# Patient Record
Sex: Female | Born: 1940
Health system: Southern US, Community
[De-identification: ages and names within clinical notes are randomized; demographics above are authoritative.]

## PROBLEM LIST (undated history)

## (undated) DIAGNOSIS — F329 Major depressive disorder, single episode, unspecified: Secondary | ICD-10-CM

## (undated) DIAGNOSIS — N39490 Overflow incontinence: Secondary | ICD-10-CM

## (undated) DIAGNOSIS — K5790 Diverticulosis of intestine, part unspecified, without perforation or abscess without bleeding: Secondary | ICD-10-CM

## (undated) DIAGNOSIS — M797 Fibromyalgia: Secondary | ICD-10-CM

## (undated) DIAGNOSIS — K449 Diaphragmatic hernia without obstruction or gangrene: Secondary | ICD-10-CM

## (undated) DIAGNOSIS — G44229 Chronic tension-type headache, not intractable: Secondary | ICD-10-CM

## (undated) DIAGNOSIS — IMO0002 Reserved for concepts with insufficient information to code with codable children: Secondary | ICD-10-CM

## (undated) DIAGNOSIS — K648 Other hemorrhoids: Secondary | ICD-10-CM

## (undated) DIAGNOSIS — K219 Gastro-esophageal reflux disease without esophagitis: Secondary | ICD-10-CM

## (undated) DIAGNOSIS — I872 Venous insufficiency (chronic) (peripheral): Secondary | ICD-10-CM

## (undated) DIAGNOSIS — E119 Type 2 diabetes mellitus without complications: Secondary | ICD-10-CM

## (undated) DIAGNOSIS — Z5189 Encounter for other specified aftercare: Secondary | ICD-10-CM

## (undated) DIAGNOSIS — K644 Residual hemorrhoidal skin tags: Secondary | ICD-10-CM

## (undated) DIAGNOSIS — I10 Essential (primary) hypertension: Secondary | ICD-10-CM

## (undated) DIAGNOSIS — Z87442 Personal history of urinary calculi: Secondary | ICD-10-CM

## (undated) DIAGNOSIS — D509 Iron deficiency anemia, unspecified: Secondary | ICD-10-CM

## (undated) DIAGNOSIS — I700 Atherosclerosis of aorta: Secondary | ICD-10-CM

## (undated) DIAGNOSIS — J45909 Unspecified asthma, uncomplicated: Secondary | ICD-10-CM

## (undated) DIAGNOSIS — K5909 Other constipation: Secondary | ICD-10-CM

## (undated) DIAGNOSIS — N393 Stress incontinence (female) (male): Secondary | ICD-10-CM

## (undated) DIAGNOSIS — E663 Overweight: Secondary | ICD-10-CM

## (undated) DIAGNOSIS — M1990 Unspecified osteoarthritis, unspecified site: Secondary | ICD-10-CM

## (undated) DIAGNOSIS — M8580 Other specified disorders of bone density and structure, unspecified site: Secondary | ICD-10-CM

## (undated) DIAGNOSIS — J302 Other seasonal allergic rhinitis: Secondary | ICD-10-CM

## (undated) DIAGNOSIS — D1330 Benign neoplasm of unspecified part of small intestine: Secondary | ICD-10-CM

## (undated) DIAGNOSIS — R7611 Nonspecific reaction to tuberculin skin test without active tuberculosis: Secondary | ICD-10-CM

## (undated) DIAGNOSIS — E785 Hyperlipidemia, unspecified: Secondary | ICD-10-CM

## (undated) DIAGNOSIS — E559 Vitamin D deficiency, unspecified: Secondary | ICD-10-CM

## (undated) HISTORY — DX: Gastro-esophageal reflux disease without esophagitis: K21.9

## (undated) HISTORY — DX: Diaphragmatic hernia without obstruction or gangrene: K44.9

## (undated) HISTORY — DX: Residual hemorrhoidal skin tags: K64.4

## (undated) HISTORY — DX: Benign neoplasm of unspecified part of small intestine: D13.30

## (undated) HISTORY — PX: CHOLECYSTECTOMY: SHX55

## (undated) HISTORY — DX: Hyperlipidemia, unspecified: E78.5

## (undated) HISTORY — DX: Reserved for concepts with insufficient information to code with codable children: IMO0002

## (undated) HISTORY — DX: Essential (primary) hypertension: I10

## (undated) HISTORY — DX: Other seasonal allergic rhinitis: J30.2

## (undated) HISTORY — DX: Diverticulosis of intestine, part unspecified, without perforation or abscess without bleeding: K57.90

## (undated) HISTORY — DX: Unspecified asthma, uncomplicated: J45.909

## (undated) HISTORY — DX: Iron deficiency anemia, unspecified: D50.9

## (undated) HISTORY — DX: Stress incontinence (female) (male): N39.3

## (undated) HISTORY — DX: Other specified disorders of bone density and structure, unspecified site: M85.80

## (undated) HISTORY — DX: Overflow incontinence: N39.490

## (undated) HISTORY — DX: Overweight: E66.3

## (undated) HISTORY — DX: Unspecified osteoarthritis, unspecified site: M19.90

## (undated) HISTORY — DX: Other constipation: K59.09

## (undated) HISTORY — DX: Atherosclerosis of aorta: I70.0

## (undated) HISTORY — DX: Encounter for other specified aftercare: Z51.89

## (undated) HISTORY — DX: Chronic tension-type headache, not intractable: G44.229

## (undated) HISTORY — DX: Type 2 diabetes mellitus without complications: E11.9

## (undated) HISTORY — DX: Vitamin D deficiency, unspecified: E55.9

## (undated) HISTORY — DX: Nonspecific reaction to tuberculin skin test without active tuberculosis: R76.11

## (undated) HISTORY — DX: Other hemorrhoids: K64.8

## (undated) HISTORY — DX: Major depressive disorder, single episode, unspecified: F32.9

## (undated) HISTORY — DX: Fibromyalgia: M79.7

## (undated) HISTORY — PX: TONSILLECTOMY: SUR1361

## (undated) HISTORY — PX: OTHER SURGICAL HISTORY: SHX169

## (undated) HISTORY — DX: Venous insufficiency (chronic) (peripheral): I87.2

---

## 1962-12-19 HISTORY — PX: TONSILLECTOMY: SHX5217

## 1978-12-19 HISTORY — PX: TUBAL LIGATION: SHX77

## 1983-12-20 HISTORY — PX: TOTAL ABDOMINAL HYSTERECTOMY: SHX209

## 1993-12-19 HISTORY — PX: KNEE ARTHROSCOPY: SUR90

## 1998-08-23 ENCOUNTER — Inpatient Hospital Stay (HOSPITAL_COMMUNITY): Admission: EM | Admit: 1998-08-23 | Discharge: 1998-08-25 | Payer: Self-pay | Admitting: Emergency Medicine

## 1998-10-07 ENCOUNTER — Emergency Department (HOSPITAL_COMMUNITY): Admission: EM | Admit: 1998-10-07 | Discharge: 1998-10-07 | Payer: Self-pay | Admitting: Emergency Medicine

## 1999-05-19 ENCOUNTER — Encounter: Payer: Self-pay | Admitting: *Deleted

## 1999-05-19 ENCOUNTER — Ambulatory Visit (HOSPITAL_COMMUNITY): Admission: RE | Admit: 1999-05-19 | Discharge: 1999-05-19 | Payer: Self-pay | Admitting: *Deleted

## 1999-07-17 ENCOUNTER — Encounter: Payer: Self-pay | Admitting: *Deleted

## 1999-07-17 ENCOUNTER — Ambulatory Visit: Admission: RE | Admit: 1999-07-17 | Discharge: 1999-07-17 | Payer: Self-pay | Admitting: *Deleted

## 1999-08-20 ENCOUNTER — Encounter: Payer: Self-pay | Admitting: *Deleted

## 1999-08-20 ENCOUNTER — Ambulatory Visit (HOSPITAL_COMMUNITY): Admission: RE | Admit: 1999-08-20 | Discharge: 1999-08-20 | Payer: Self-pay | Admitting: *Deleted

## 2000-01-07 ENCOUNTER — Encounter: Payer: Self-pay | Admitting: Emergency Medicine

## 2000-01-07 ENCOUNTER — Emergency Department (HOSPITAL_COMMUNITY): Admission: EM | Admit: 2000-01-07 | Discharge: 2000-01-07 | Payer: Self-pay | Admitting: Emergency Medicine

## 2001-05-22 ENCOUNTER — Ambulatory Visit (HOSPITAL_BASED_OUTPATIENT_CLINIC_OR_DEPARTMENT_OTHER): Admission: RE | Admit: 2001-05-22 | Discharge: 2001-05-22 | Payer: Self-pay | Admitting: Orthopedic Surgery

## 2003-10-29 ENCOUNTER — Ambulatory Visit (HOSPITAL_COMMUNITY): Admission: RE | Admit: 2003-10-29 | Discharge: 2003-10-29 | Payer: Self-pay | Admitting: *Deleted

## 2003-10-29 ENCOUNTER — Encounter (INDEPENDENT_AMBULATORY_CARE_PROVIDER_SITE_OTHER): Payer: Self-pay | Admitting: Specialist

## 2003-10-29 ENCOUNTER — Encounter (INDEPENDENT_AMBULATORY_CARE_PROVIDER_SITE_OTHER): Payer: Self-pay | Admitting: *Deleted

## 2003-11-11 ENCOUNTER — Other Ambulatory Visit: Admission: RE | Admit: 2003-11-11 | Discharge: 2003-11-11 | Payer: Self-pay | Admitting: Endocrinology

## 2004-10-23 ENCOUNTER — Ambulatory Visit (HOSPITAL_COMMUNITY): Admission: RE | Admit: 2004-10-23 | Discharge: 2004-10-23 | Payer: Self-pay | Admitting: Sports Medicine

## 2006-07-03 ENCOUNTER — Ambulatory Visit: Payer: Self-pay | Admitting: Gastroenterology

## 2006-07-07 ENCOUNTER — Ambulatory Visit: Payer: Self-pay | Admitting: Gastroenterology

## 2006-07-10 ENCOUNTER — Ambulatory Visit (HOSPITAL_COMMUNITY): Admission: RE | Admit: 2006-07-10 | Discharge: 2006-07-10 | Payer: Self-pay | Admitting: Gastroenterology

## 2006-07-10 ENCOUNTER — Encounter (INDEPENDENT_AMBULATORY_CARE_PROVIDER_SITE_OTHER): Payer: Self-pay | Admitting: *Deleted

## 2006-08-02 ENCOUNTER — Ambulatory Visit: Payer: Self-pay | Admitting: Gastroenterology

## 2008-04-02 ENCOUNTER — Ambulatory Visit (HOSPITAL_COMMUNITY): Admission: RE | Admit: 2008-04-02 | Discharge: 2008-04-02 | Payer: Self-pay | Admitting: Infectious Diseases

## 2008-04-02 ENCOUNTER — Ambulatory Visit: Payer: Self-pay | Admitting: Infectious Diseases

## 2008-04-02 LAB — CONVERTED CEMR LAB
Albumin: 3.6 g/dL (ref 3.5–5.2)
BUN: 12 mg/dL (ref 6–23)
CO2: 29 meq/L (ref 19–32)
Calcium: 9.1 mg/dL (ref 8.4–10.5)
Chloride: 102 meq/L (ref 96–112)
Glucose, Bld: 103 mg/dL — ABNORMAL HIGH (ref 70–99)
HCT: 33.5 % — ABNORMAL LOW (ref 36.0–46.0)
Lymphocytes Relative: 35 % (ref 12–46)
Lymphs Abs: 1.6 10*3/uL (ref 0.7–4.0)
MCV: 77.1 fL — ABNORMAL LOW (ref 78.0–100.0)
Monocytes Absolute: 0.5 10*3/uL (ref 0.1–1.0)
Monocytes Relative: 12 % (ref 3–12)
Neutro Abs: 2.2 10*3/uL (ref 1.7–7.7)
Potassium: 3.8 meq/L (ref 3.5–5.3)
RDW: 14.3 % (ref 11.5–15.5)
WBC: 4.5 10*3/uL (ref 4.0–10.5)

## 2009-03-18 ENCOUNTER — Encounter: Payer: Self-pay | Admitting: Physician Assistant

## 2009-04-09 DIAGNOSIS — E119 Type 2 diabetes mellitus without complications: Secondary | ICD-10-CM

## 2009-04-09 DIAGNOSIS — E785 Hyperlipidemia, unspecified: Secondary | ICD-10-CM

## 2009-04-09 DIAGNOSIS — J45909 Unspecified asthma, uncomplicated: Secondary | ICD-10-CM

## 2009-04-09 DIAGNOSIS — E1143 Type 2 diabetes mellitus with diabetic autonomic (poly)neuropathy: Secondary | ICD-10-CM | POA: Insufficient documentation

## 2009-04-09 DIAGNOSIS — J4520 Mild intermittent asthma, uncomplicated: Secondary | ICD-10-CM | POA: Insufficient documentation

## 2009-04-09 HISTORY — DX: Unspecified asthma, uncomplicated: J45.909

## 2009-04-09 HISTORY — DX: Type 2 diabetes mellitus without complications: E11.9

## 2009-04-09 HISTORY — DX: Hyperlipidemia, unspecified: E78.5

## 2009-04-13 ENCOUNTER — Ambulatory Visit: Payer: Self-pay | Admitting: Cardiology

## 2009-04-17 ENCOUNTER — Ambulatory Visit: Payer: Self-pay

## 2009-04-17 ENCOUNTER — Encounter: Payer: Self-pay | Admitting: Cardiology

## 2009-04-18 HISTORY — PX: SHOULDER SURGERY: SHX246

## 2009-05-05 ENCOUNTER — Ambulatory Visit (HOSPITAL_BASED_OUTPATIENT_CLINIC_OR_DEPARTMENT_OTHER): Admission: RE | Admit: 2009-05-05 | Discharge: 2009-05-06 | Payer: Self-pay | Admitting: Orthopedic Surgery

## 2009-10-02 ENCOUNTER — Encounter: Payer: Self-pay | Admitting: Gastroenterology

## 2010-05-04 ENCOUNTER — Encounter (INDEPENDENT_AMBULATORY_CARE_PROVIDER_SITE_OTHER): Payer: Self-pay | Admitting: *Deleted

## 2010-05-04 ENCOUNTER — Ambulatory Visit: Payer: Self-pay | Admitting: Gastroenterology

## 2010-05-04 DIAGNOSIS — D509 Iron deficiency anemia, unspecified: Secondary | ICD-10-CM

## 2010-05-04 HISTORY — DX: Iron deficiency anemia, unspecified: D50.9

## 2010-06-02 ENCOUNTER — Ambulatory Visit: Payer: Self-pay | Admitting: Gastroenterology

## 2010-06-14 ENCOUNTER — Observation Stay (HOSPITAL_COMMUNITY): Admission: EM | Admit: 2010-06-14 | Discharge: 2010-06-15 | Payer: Self-pay | Admitting: Emergency Medicine

## 2011-01-20 NOTE — Procedures (Signed)
Summary: EGD   EGD  Procedure date:  10/29/2003  Findings:      Location: [facility]     NAME:  Colleen Medina, Colleen Medina                     ACCOUNT NO.:  [id number]   MEDICAL RECORD NO.:  [id number]                   PATIENT TYPE:  AMB   LOCATION:  ENDO                                 FACILITY:  [facility]   PHYSICIAN:  Georgiana Spinner, M.D.                 DATE OF BIRTH:  [birth year]   DATE OF PROCEDURE:  DATE OF DISCHARGE:                                 OPERATIVE REPORT   PROCEDURE:  Upper endoscopy.   INDICATIONS:  Abdominal pain.   ANESTHESIA:  Demerol 40 mg, Versed 6 mg.   DESCRIPTION OF PROCEDURE:  With the patient mildly sedated in the left  lateral decubitus position, the Olympus videoscopic endoscope was inserted  in the mouth and passed under direct vision through the esophagus.  There  was a question of Barrett's esophagitis in one section of the squamocolumnar  junction and this was photographed and subsequently biopsied.  We entered  into the stomach.  The  fundus, body, antrum, duodenal bulb, and second  portion of the duodenum all appeared normal.  From this point, the endoscope  was slowly withdrawn taking circumferential views of the duodenal mucosa  until the endoscope was pulled back into the stomach, placed in retroflexion  and viewed the stomach from below and a widely patent lax gastroesophageal  junction was noted.  The endoscope was then straightened, withdrawn, taking  circumferential views of the remaining gastric and esophageal mucosa.  The  patient's vital signs and pulse oximetry remained stable.  The patient  tolerated the procedure well without apparent complication.   FINDINGS:  Question of Barrett's esophagitis, biopsied above a lax  gastroesophageal junction.   PLAN:  Await biopsy report.  The patient will call me for results and follow  up with me as an outpatient.  Proceed to colonoscopy.      Georgiana Spinner, M.D.    GMO/MEDQ  D:  10/29/2003  T:  10/29/2003  Job:  [id number]

## 2011-01-20 NOTE — Procedures (Signed)
Summary: Colonoscopy  Patient: [name] Note: All result statuses are Final unless otherwise noted.  Tests: (1) Colonoscopy (COL)   COL Colonoscopy           DONE     [facility]     [address]     [place], [place]  [address]           COLONOSCOPY PROCEDURE REPORT           PATIENT:  Colleen Medina, Colleen Medina  MR#:  [id number]     BIRTHDATE:  05-09-1941, 68 yrs. old  GENDER:  female     ENDOSCOPIST:  Rachael Fee, MD     REF. BY:  Providence Crosby, M.D.     PROCEDURE DATE:  06/02/2010     PROCEDURE:  Diagnostic Colonoscopy     ASA CLASS:  Class II     INDICATIONS:  anemia, intermittent rectal bleeding     MEDICATIONS:   Fentanyl 50 mcg IV, Versed 5 mg IV           DESCRIPTION OF PROCEDURE:   After the risks benefits and     alternatives of the procedure were thoroughly explained, informed     consent was obtained.  Digital rectal exam was performed and     revealed no rectal masses.   The LB PCF-[id number] endoscope     was introduced through the anus and advanced to the cecum, which     was identified by both the appendix and ileocecal valve, without     limitations.  The quality of the prep was good, using MoviPrep.     The instrument was then slowly withdrawn as the colon was fully     examined.     <<PROCEDUREIMAGES>>           FINDINGS:  Mild diverticulosis was found throughout the colon (see     image4).  Internal and external hemorrhoids were found, these were     small to medium sized and not thrombosed.  This was otherwise a     normal examination of the colon (see image2, image3, and image5).     Retroflexed views in the rectum revealed no abnormalities.    The     scope was then withdrawn from the patient and the procedure     completed.           COMPLICATIONS:  None     ENDOSCOPIC IMPRESSION:     1) Mild diverticulosis throughout the colon     2) Small to medium sized internal and external hemorrhoids     3) Otherwise normal examination; no  polyps or cancers           RECOMMENDATIONS:     1) Continue current colorectal screening recommendations for     "routine risk" patients with a repeat colonoscopy in 10 years.           REPEAT EXAM:  10           ______________________________     Rachael Fee, MD           n.     eSIGNED:   Rachael Fee at 06/02/2010 09:48 AM           Adeline, [place], [id number]  Note: An exclamation mark (!) indicates a result that was not dispersed into the flowsheet. Document Creation Date: 06/02/2010 9:49 AM _______________________________________________________________________  (1) Order result status: Final Collection or observation date-time: 06/02/2010 09:44 Requested  date-time:  Receipt date-time:  Reported date-time:  Referring Physician:   Ordering Physician: Rob Bunting [id number]) Specimen Source:  Source: Launa Grill Order Number: [id number] Lab site:   Appended Document: Colonoscopy    Clinical Lists Changes  Observations: Added new observation of COLONNXTDUE: 05/2020 (06/02/2010 12:45)

## 2011-01-20 NOTE — Assessment & Plan Note (Signed)
History of Present Illness Visit Type: Follow-up Consult Primary GI MD: Rob Bunting MD Primary Provider: Providence Crosby, MD Requesting Provider: Providence Crosby, MD Chief Complaint: Chronic Anemia History of Present Illness:     pleasant 70 year old woman whowas sent here for "anemia."  She believes she was told she was anemic many years ago.    She was also told she needed a colonoscopy. She had a colonoscopy in 2004, no polyps or cancers were noted.  she brought with her a copy of blood work done last month. This shows a normal complete metabolic profile, elevated hemoglobin A1c. CBC was not included.  she was here in 2007.  GAstroparesis noted on EGD and GEScan.  Probably has gained some weight lately.  She eats 1 meal a day.  She was supposed to see me in follow up at 2 lmonth interval, but didn't.  She sees red blood periodically with BMs, especially with constipation.           Current Medications (verified): 1)  Enalapril-Hydrochlorothiazide 5-12.5 Mg  Tabs (Enalapril-Hydrochlorothiazide) .... Take One By Mouth Once Daily 2)  Lipitor 40 Mg Tabs (Atorvastatin Calcium) .... Take One By Mouth Once Daily 3)  Prilosec 20 Mg Cpdr (Omeprazole) .Marland Kitchen.. 1 Once Daily 4)  Albuterol Sulfate (2.5 Mg/41ml) 0.083% Nebu (Albuterol Sulfate) .... Inhale 3ml Four Times A Day As Needed 5)  Tramadol Hcl 50 Mg Tabs (Tramadol Hcl) .... Take One By Mouth Up To Four Times A Day As Needed Pain 6)  Metformin Hcl 500 Mg Tabs (Metformin Hcl) .... Take 1 Tab Twice Daily 7)  Fish Oil 1000 Mg Caps (Omega-3 Fatty Acids) .... Take One By Mouth Once Daily 8)  Carafate 1 Gm Tabs (Sucralfate) .... Take One By Mouth Before Meals and At Bedtime 9)  Reglan 10 Mg Tabs (Metoclopramide Hcl) .... Take 1/2 Tablet By Mouth Before Meals and One By Mouth At Bedtime 10)  Proair Hfa 108 (90 Base) Mcg/act Aers (Albuterol Sulfate) .... Two Puffs Four Times A Day As Needed  Allergies (verified): 1)  ! Penicillin 2)  !  Sulfa  Past History:  Past Medical History: HYPERTENSION, UNSPECIFIED (ICD-401.9) HYPERLIPIDEMIA-MIXED (ICD-272.4) ASTHMA (ICD-493.90) DM (ICD-250.00) CELLULITIS AND ABSCESS OF OTHER SPECIFIED SITE [phone]) Seasonal allergies Constipation GERD  gastroparesis, noted by EGD 2007, confirmed on gastric emptying scan  Past Surgical History: hysterectomy tubal ligation cholecystectomy right knee scope tonsillectomy   Family History: Family History of Diabetes:  Family history of coronary disease no colon cancer  Social History: Married  Tobacco Use - No.  Alcohol Use - no 3 children Works as a Chief Strategy Officer  Review of Systems       Pertinent positive and negative review of systems were noted in the above HPI and GI specific review of systems.  All other review of systems was otherwise negative.   Vital Signs:  Patient profile:   70 year old female Height:      59 inches Weight:      141.8 pounds BMI:     28.74 Pulse rate:   68 / minute Pulse rhythm:   regular BP sitting:   142 / 80  (left arm) Cuff size:   regular  Vitals Entered By: Harlow Mares CMA Duncan Dull) (May 04, 2010 9:17 AM)  Physical Exam  Additional Exam:  Constitutional: generally well appearing Psychiatric: alert and oriented times 3 Eyes: extraocular movements intact Mouth: oropharynx moist, no lesions Neck: supple, no lymphadenopathy Cardiovascular: heart regular rate and rythm Lungs: CTA  bilaterally Abdomen: soft, non-tender, non-distended, no obvious ascites, no peritoneal signs, normal bowel sounds Extremities: no lower extremity edema bilaterally Skin: no lesions on visible extremities    Impression & Recommendations:  Problem # 1:  intermittent bright blood per rectum, question anemia I do not have a recent CBC here to review. We will contact her PCP office for that. If not done recently she will need repeat set of labs done here. She has intermittent red blood per rectum and her  last colonoscopy was 7 years ago. I think it is reasonable that we repeat colonoscopy at this time since she is having overt symptoms and possible anemia.  Problem # 2:  gastroparesis I described potential permanent side effects from Reglan with her and I did start this medicine for back to 2007. She was supposed to followup with me 2 months later but did not. I recommended today that she try to come off of the Reglan and instead try eating 3-4 smaller meals a day rather than the one large meal which she is accustomed to eating now.  Patient Instructions: 1)  Will get blood tests from CBC sent here (cbc, iron studies).  You may need further blood tests pending those results. 2)  You will be scheduled to have a colonoscopy. 3)  Try stopping Reglan completely, there are potential serious side effects of this pill. 4)  Instead, start eating 3-4 small meals a day, rather than 1 large meal a day. 5)  A copy of this information will be sent to Dr. Marijo Conception. 6)  The medication list was reviewed and reconciled.  All changed / newly prescribed medications were explained.  A complete medication list was provided to the patient / caregiver.  Appended Document: Orders Update/movi    Clinical Lists Changes  Problems: Added new problem of ANEMIA (ICD-285.9) Medications: Added new medication of MOVIPREP 100 GM  SOLR (PEG-KCL-NACL-NASULF-NA ASC-C) As per prep instructions. - Signed Rx of MOVIPREP 100 GM  SOLR (PEG-KCL-NACL-NASULF-NA ASC-C) As per prep instructions.;  #1 x 0;  Signed;  Entered by: Chales Abrahams CMA (AAMA);  Authorized by: Rachael Fee MD;  Method used: Electronically to [facility] [phone]*, [address], [place], [place]  [address], Ph: [phone], Fax: [phone] Orders: Added new Test order of Colonoscopy (Colon) - Signed    Prescriptions: MOVIPREP 100 GM  SOLR (PEG-KCL-NACL-NASULF-NA ASC-C) As per prep instructions.  #1 x 0   Entered by:   Chales Abrahams CMA (AAMA)    Authorized by:   Rachael Fee MD   Signed by:   Chales Abrahams CMA (AAMA) on 05/04/2010   Method used:   Electronically to        [facility] [phone]* (retail)       [address]       [place], [place]  [address]       Ph: [phone]       Fax: [phone]   RxID:   [id number]

## 2011-01-20 NOTE — Letter (Signed)
Summary: [facility] Instructions  [facility]  [address] [place], [place] [address]   Phone: [phone]  Fax: [phone]       Colleen Medina    09/01/1941    MRN: [id number]        Procedure Day /Date:06/02/10  WED     Arrival Time:830 am     Procedure Time:930 am     Location of Procedure:                    [facility] (4th Floor)   PREPARATION FOR COLONOSCOPY WITH MOVIPREP   Starting 5 days prior to your procedure 6/10/11do not eat nuts, seeds, popcorn, corn, beans, peas,  salads, or any raw vegetables.  Do not take any fiber supplements (e.g. Metamucil, Citrucel, and Benefiber).  THE DAY BEFORE YOUR PROCEDURE         DATE: 06/01/10 DAY: TUE  1.  Drink clear liquids the entire day-NO SOLID FOOD  2.  Do not drink anything colored red or purple.  Avoid juices with pulp.  No orange juice.  3.  Drink at least 64 oz. (8 glasses) of fluid/clear liquids during the day to prevent dehydration and help the prep work efficiently.  CLEAR LIQUIDS INCLUDE: Water Jello Ice Popsicles Tea (sugar ok, no milk/cream) Powdered fruit flavored drinks Coffee (sugar ok, no milk/cream) Gatorade Juice: apple, white grape, white cranberry  Lemonade Clear bullion, consomm, broth Carbonated beverages (any kind) Strained chicken noodle soup Hard Candy                             4.  In the morning, mix first dose of MoviPrep solution:    Empty 1 Pouch A and 1 Pouch B into the disposable container    Add lukewarm drinking water to the top line of the container. Mix to dissolve    Refrigerate (mixed solution should be used within 24 hrs)  5.  Begin drinking the prep at 5:00 p.m. The MoviPrep container is divided by 4 marks.   Every 15 minutes drink the solution down to the next mark (approximately 8 oz) until the full liter is complete.   6.  Follow completed prep with 16 oz of clear liquid of your choice (Nothing red or purple).  Continue to drink  clear liquids until bedtime.  7.  Before going to bed, mix second dose of MoviPrep solution:    Empty 1 Pouch A and 1 Pouch B into the disposable container    Add lukewarm drinking water to the top line of the container. Mix to dissolve    Refrigerate  THE DAY OF YOUR PROCEDURE      DATE: 06/02/10 DAY: WED  Beginning at 430 a.m. (5 hours before procedure):         1. Every 15 minutes, drink the solution down to the next mark (approx 8 oz) until the full liter is complete.  2. Follow completed prep with 16 oz. of clear liquid of your choice.    3. You may drink clear liquids until 730 am (2 HOURS BEFORE PROCEDURE).   MEDICATION INSTRUCTIONS  Unless otherwise instructed, you should take regular prescription medications with a small sip of water   as early as possible the morning of your procedure.  Diabetic patients - see separate instructions.           OTHER INSTRUCTIONS  You will need a responsible  adult at least 68 years of age to accompany you and drive you home.   This person must remain in the waiting room during your procedure.  Wear loose fitting clothing that is easily removed.  Leave jewelry and other valuables at home.  However, you may wish to bring a book to read or  an iPod/MP3 player to listen to music as you wait for your procedure to start.  Remove all body piercing jewelry and leave at home.  Total time from sign-in until discharge is approximately 2-3 hours.  You should go home directly after your procedure and rest.  You can resume normal activities the  day after your procedure.  The day of your procedure you should not:   Drive   Make legal decisions   Operate machinery   Drink alcohol   Return to work  You will receive specific instructions about eating, activities and medications before you leave.    The above instructions have been reviewed and explained to me by   _______________________    I fully understand and can  verbalize these instructions _____________________________ Date _________

## 2011-01-20 NOTE — Procedures (Signed)
Summary: EGD   EGD  Procedure date:  07/07/2006  Findings:      Location: [facility]     Patient Name: Colleen Medina, Colleen Medina. MRN:  Procedure Procedures: Panendoscopy (EGD) CPT: 43235.  Personnel: Endoscopist: Rachael Fee, MD.  Exam Location: Exam performed in Endoscopy Suite. Outpatient  Patient Consent: Procedure, Alternatives, Risks and Benefits discussed, consent obtained, from patient. Consent was obtained by the RN.  Indications Symptoms: Dyspepsia, Reflux symptoms  Comments: [id number] TAP History  Current Medications: Patient is currently taking Coumadin.  Comments: Patient history reviewed and updated, pre-procedure physical performed prior to initiation of sedation? yes Pre-Exam Physical: Performed Jul 07, 2006  Cardio-pulmonary exam, Abdominal exam, Mental status exam WNL.  Exam Exam Info: Maximum depth of insertion Duodenum, intended Duodenum. Patient position: on left side. Gastric retroflexion performed. Images taken. ASA Classification: II. Tolerance: good.  Sedation Meds: Patient assessed and found to be appropriate for moderate (conscious) sedation. Fentanyl 25 mcg. given IV. Versed 5 mg. given IV.  Monitoring: BP and pulse monitoring done. Oximetry used. Supplemental O2 given  Findings - Normal: Proximal Esophagus to Duodenal 2nd Portion. Comments: Otherwise normal exam.  No esophagitis or Barrett's.  No obstruction.  Gastric lumen poorly visualized due to retained food.  HIATAL HERNIA: 2 cms. in length.  - OTHER FINDING: large volume retained solid food in Fundus.   Assessment Abnormal examination, see findings above.  Comments: Large volume of retained solid food in stomach, no signs of gastric outlet obstruction. Events  Unplanned Intervention: No unplanned interventions were required.  Unplanned Events: There were no complications. Plans Comments: Will arrange for gastric emptying scan and return office visit in  2-3 weeks.  No meds for now. Patient Name: Colleen Medina, Colleen Medina. MRN:  Amendment 1 - Jul 07, 2006 0 History DELETED<<>>DELETED Current Medications: DELETED<<Patient is currently taking Coumadin. >>DELETED Current Medications: Patient is not currently taking Coumadin.  [Fellow] This report was created from the original endoscopy report, which was reviewed and signed by the above listed endoscopist.

## 2011-01-20 NOTE — Letter (Signed)
Summary: Diabetic Instructions  [facility]  [address] [place], [place] [address]   Phone: [phone]  Fax: [phone]    Colleen Medina 1941/04/24 MRN: [id number]   X   ORAL DIABETIC MEDICATION INSTRUCTIONS  The day before your procedure:   Take your diabetic pill as you do normally  The day of your procedure:   Do not take your diabetic pill    We will check your blood sugar levels during the admission process and again in Recovery before discharging you home  ________________________________________________________________________

## 2011-01-20 NOTE — Procedures (Signed)
Summary: Colon   Colonoscopy  Procedure date:  10/29/2003  Findings:      Location:  [facility]     NAME:  Colleen Medina, Colleen Medina                     ACCOUNT NO.:  [id number]   MEDICAL RECORD NO.:  [id number]                   PATIENT TYPE:  AMB   LOCATION:  ENDO                                 FACILITY:  [facility]   PHYSICIAN:  Georgiana Spinner, M.D.                 DATE OF BIRTH:  1941-08-04   DATE OF PROCEDURE:  DATE OF DISCHARGE:                                 OPERATIVE REPORT   PROCEDURE:  Colonoscopy.   INDICATIONS FOR PROCEDURE:  Colon cancer screening, diarrhea chronic.   ANESTHESIA:  Demerol 20, Versed 2 mg.   DESCRIPTION OF PROCEDURE:  With the patient mildly sedated in the left  lateral decubitus position, the Olympus videoscopic colonoscope was inserted  into the rectum and passed under direct vision to the cecum identified by  the ileocecal valve and appendiceal orifice both of which were photographed.  We entered into the terminal ileum and this too appeared normal and was  photographed. From this point, the colonoscope was then slowly withdrawn  taking circumferential views of the colonic mucosa stopping to take biopsies  randomly from what appeared to be normal appearing colonic mucosa to rule  out microscopic colitis. The endoscope was withdrawn all the way to the  rectum which appeared normal on direct and retroflexed view. The endoscope  was straightened and withdrawn. The patient's vital signs and pulse oximeter  remained stable. The patient tolerated the procedure well without apparent  complications.   FINDINGS:  Mild small mouth diverticulosis seen throughout the colon but  otherwise an unremarkable colonoscopic examination including the terminal  ileum.   PLAN:  Await biopsy report. The patient will call me for results of the  biopsy and followup with me as an outpatient.                                               Georgiana Spinner,  M.D.    GMO/MEDQ  D:  10/29/2003  T:  10/29/2003  Job:  [id number]

## 2011-03-01 ENCOUNTER — Emergency Department (HOSPITAL_COMMUNITY): Payer: Medicare Other

## 2011-03-01 ENCOUNTER — Emergency Department (HOSPITAL_COMMUNITY)
Admission: EM | Admit: 2011-03-01 | Discharge: 2011-03-02 | Disposition: A | Discharge status: Home / Self Care | Payer: Medicare Other | Attending: Emergency Medicine | Admitting: Emergency Medicine

## 2011-03-01 DIAGNOSIS — R112 Nausea with vomiting, unspecified: Secondary | ICD-10-CM | POA: Insufficient documentation

## 2011-03-01 DIAGNOSIS — R109 Unspecified abdominal pain: Secondary | ICD-10-CM | POA: Insufficient documentation

## 2011-03-01 DIAGNOSIS — I10 Essential (primary) hypertension: Secondary | ICD-10-CM | POA: Insufficient documentation

## 2011-03-01 DIAGNOSIS — R509 Fever, unspecified: Secondary | ICD-10-CM | POA: Insufficient documentation

## 2011-03-01 DIAGNOSIS — R197 Diarrhea, unspecified: Secondary | ICD-10-CM | POA: Insufficient documentation

## 2011-03-01 DIAGNOSIS — K219 Gastro-esophageal reflux disease without esophagitis: Secondary | ICD-10-CM | POA: Insufficient documentation

## 2011-03-01 DIAGNOSIS — E1169 Type 2 diabetes mellitus with other specified complication: Secondary | ICD-10-CM | POA: Insufficient documentation

## 2011-03-02 LAB — CBC
Hemoglobin: 12 g/dL (ref 12.0–15.0)
MCHC: 31.3 g/dL (ref 30.0–36.0)
Platelets: 269 10*3/uL (ref 150–400)
RBC: 4.98 MIL/uL (ref 3.87–5.11)

## 2011-03-02 LAB — DIFFERENTIAL
Basophils Absolute: 0 10*3/uL (ref 0.0–0.1)
Basophils Relative: 0 % (ref 0–1)
Eosinophils Absolute: 0 10*3/uL (ref 0.0–0.7)
Monocytes Absolute: 0.1 10*3/uL (ref 0.1–1.0)
Neutro Abs: 7.6 10*3/uL (ref 1.7–7.7)
Neutrophils Relative %: 88 % — ABNORMAL HIGH (ref 43–77)

## 2011-03-02 LAB — URINE MICROSCOPIC-ADD ON

## 2011-03-02 LAB — URINALYSIS, ROUTINE W REFLEX MICROSCOPIC
Bilirubin Urine: NEGATIVE
Glucose, UA: 100 mg/dL — AB
Specific Gravity, Urine: 1.02 (ref 1.005–1.030)
Urobilinogen, UA: 0.2 mg/dL (ref 0.0–1.0)
pH: 5.5 (ref 5.0–8.0)

## 2011-03-02 LAB — COMPREHENSIVE METABOLIC PANEL
ALT: 15 U/L (ref 0–35)
AST: 24 U/L (ref 0–37)
Albumin: 4.3 g/dL (ref 3.5–5.2)
CO2: 22 mEq/L (ref 19–32)
Calcium: 9.4 mg/dL (ref 8.4–10.5)
GFR calc Af Amer: >60 mL/min (ref >60)
GFR calc non Af Amer: >60 mL/min (ref >60)
Sodium: 135 mEq/L (ref 135–145)

## 2011-03-02 MED ORDER — IOHEXOL 300 MG/ML  SOLN
100.0000 mL | Freq: Once | INTRAMUSCULAR | Status: AC | PRN
  Administered 2011-03-02: 100 mL via INTRAVENOUS

## 2011-03-06 LAB — GLUCOSE, CAPILLARY
Glucose-Capillary: 103 mg/dL — ABNORMAL HIGH (ref 70–99)
Glucose-Capillary: 85 mg/dL (ref 70–99)

## 2011-03-07 LAB — BASIC METABOLIC PANEL
CO2: 29 mEq/L (ref 19–32)
Calcium: 8.9 mg/dL (ref 8.4–10.5)
Chloride: 106 mEq/L (ref 96–112)
Creatinine, Ser: 0.72 mg/dL (ref 0.4–1.2)
GFR calc Af Amer: >60 mL/min (ref >60)
Glucose, Bld: 102 mg/dL — ABNORMAL HIGH (ref 70–99)

## 2011-03-07 LAB — POCT I-STAT, CHEM 8
Calcium, Ion: 1.08 mmol/L — ABNORMAL LOW (ref 1.12–1.32)
Creatinine, Ser: 0.9 mg/dL (ref 0.4–1.2)
Glucose, Bld: 109 mg/dL — ABNORMAL HIGH (ref 70–99)
HCT: 40 % (ref 36.0–46.0)
Hemoglobin: 13.6 g/dL (ref 12.0–15.0)
TCO2: 27 mmol/L (ref 0–100)

## 2011-03-07 LAB — URINALYSIS, ROUTINE W REFLEX MICROSCOPIC
Bilirubin Urine: NEGATIVE
Glucose, UA: NEGATIVE mg/dL
Hgb urine dipstick: NEGATIVE
Protein, ur: NEGATIVE mg/dL
Urobilinogen, UA: 0.2 mg/dL (ref 0.0–1.0)

## 2011-03-29 LAB — GLUCOSE, CAPILLARY
Glucose-Capillary: 101 mg/dL — ABNORMAL HIGH (ref 70–99)
Glucose-Capillary: 133 mg/dL — ABNORMAL HIGH (ref 70–99)

## 2011-03-29 LAB — BASIC METABOLIC PANEL
Chloride: 104 mEq/L (ref 96–112)
GFR calc non Af Amer: >60 mL/min (ref >60)
Potassium: 3.8 mEq/L (ref 3.5–5.1)
Sodium: 140 mEq/L (ref 135–145)

## 2011-04-20 ENCOUNTER — Other Ambulatory Visit: Payer: Self-pay | Admitting: Gastroenterology

## 2011-05-03 NOTE — Op Note (Signed)
Colleen Medina, Colleen Medina             ACCOUNT NO.:  [id number]   MEDICAL RECORD NO.:  [id number]          PATIENT TYPE:  AMB   LOCATION:  DSC                          FACILITY:  [facility]   PHYSICIAN:  Robert A. Thurston Hole, M.D. DATE OF BIRTH:  July 04, 1941   DATE OF PROCEDURE:  05/05/2009  DATE OF DISCHARGE:                               OPERATIVE REPORT   PREOPERATIVE DIAGNOSES:  1. Left shoulder rotator cuff tear.  2. Left shoulder partial labrum tear.  3. Left shoulder impingement.  4. Left shoulder acromioclavicular joint degenerative joint disease      and spurring.   POSTOPERATIVE DIAGNOSES:  1. Left shoulder rotator cuff tear.  2. Left shoulder partial labrum tear.  3. Left shoulder impingement.  4. Left shoulder acromioclavicular joint degenerative joint disease      and spurring.   PROCEDURES:  1. Left shoulder exam under anesthesia followed by arthroscopically      assisted rotator cuff repair using Arthrex suture anchor x1 plus      PushLock anchor x1.  2. Left shoulder labral debridement.  3. Left shoulder subacromial decompression.  4. Left shoulder distal clavicle excision.   SURGEON:  Elana Alm. Thurston Hole, MD.   ASSISTANTJulien Girt, PA   ANESTHESIA:  General.   OPERATIVE TIME:  1 hour.   COMPLICATIONS:  None.   INDICATION FOR PROCEDURE:  Colleen Medina is a 70 year old woman who has  had 3 months of left shoulder pain secondary to a fall.  Exam and MRI  have revealed a complete rotator cuff tear, partial labrum tear with  impingement, and AC joint arthropathy.  She has failed conservative care  and is now to undergo arthroscopy and repair.   DESCRIPTION OF PROCEDURE:  Colleen Medina was brought to the operating  room on May 05, 2009, after an interscalene block was placed in the  holding area by Anesthesia.  She was placed on the operating table in  supine position.  Her left shoulder was examined under anesthesia.  She  had full range of motion of her  shoulder with stable ligamentous exam.  She was given vancomycin 1 g IV preoperatively for prophylaxis.  After  this was done, she was placed in beach-chair position.  Her shoulder and  arm was prepped using sterile DuraPrep and draped using sterile  technique.  Originally, through a posterior arthroscopic portal, the  arthroscope with a pump attached was placed in through an anterior  portal, and arthroscopic probe was placed.  On initial inspection, the  articular cartilage in the glenohumeral joint was found to be intact.  She had partial tearing of the anterior, superior, and posterior labrum  of 25%, which was debrided.  The anteroinferior labrum and  anteroinferior glenohumeral ligament complex were intact.  Biceps tendon  anchor and biceps tendon were intact.  Rotator cuff showed a complete  tear of the supraspinatus and a partial tear of the infraspinatus and  this was partially debrided arthroscopically.  The rest of the rotator  cuff was intact.  The inferior capsular recess free of pathology.  Subacromial space was entered and a lateral  arthroscopic portal was  made.  Mildly thickened bursitis was resected.  Impingement was noted  and a subacromial decompression was carried out removing 6-8 mm of the  undersurface of the anterior, anterolateral, and anteromedial acromion  and CA ligament release carried out as well.  The [facility] joint showed  significant spurring and degenerative changes and distal  mm of clavicle  was resected with a 6-mm bur.  After this was done, through an accessory  lateral portal an Arthrex 5.5-mm suture anchor was placed in the greater  tuberosity.  Each of the sutures in this anchor were passed  arthroscopically in a mattress suture technique and then tied down with  a firm and tight repair.  After this was done, then the repair was  further reinforced with one PushLock anchor in the greater tuberosity.  After this was done, there was found to be a firm and  tight repair and  the shoulder could be brought to a full range of motion with no  impingement on the repair.  At this point, it was felt that all  pathology had been satisfactorily addressed.  The instruments were  removed.  The portals were closed with 3-0 nylon sutures.  Sterile  dressings and a sling applied.  The patient awakened and taken to  recovery room in stable condition.   FOLLOWUP CARE:  Colleen Medina will be followed [facility] for IV pain control, neurovascular monitoring.  Discharge  tomorrow on Percocet and Robaxin in a abduction sling.  Begin early  physical therapy.  Seen back in the office in a week for sutures out and  followup.      Robert A. Thurston Hole, M.D.  Electronically Signed     RAW/MEDQ  D:  05/05/2009  T:  05/06/2009  Job:  [id number]

## 2011-05-06 NOTE — Op Note (Signed)
[facility]  Patient:    Colleen Medina, Colleen Medina                  MRN: [id number] Proc. Date: 05/22/01 Adm. Date:  [id number] Attending:  Twana First                           Operative Report  PREOPERATIVE DIAGNOSIS:  Right knee chondromalacia.  POSTOPERATIVE DIAGNOSES: 1. Right knee lateral compartment and patellofemoral chondromalacia. 2. Right knee lateral patella subluxation and patellofemoral compression    syndrome. 3. Right knee synovitis. 4. Right knee partial anterior cruciate ligament tear.  PROCEDURE: 1. Right knee examination under anesthesia, followed by arthroscopic    chondroplasty. 2. Right knee lateral retinacular release. 3. Right knee partial synovectomy.  SURGEON:  Elana Alm. Thurston Hole, M.D.  ASSISTANT:  Julien Girt, P.A.  ANESTHESIA:  Local and MAC.  OPERATIVE TIME:  30 minutes.  COMPLICATIONS:  None.  INDICATIONS:  Colleen Medina is a 70 year old woman who has had over one year of right knee pain, with signs and symptoms consistent with chondromalacia and synovitis, but not instability.  She has failed conservative care, and is now to undergo an arthroscopy.  DESCRIPTION OF PROCEDURE:  Colleen Medina is brought to the operating room on May 22, 2001, after a block had been placed in the holding room.  She was placed on the operating room table in the supine position.  Her right knee was examined under anesthesia.  Range of motion 0-125 degrees, 1+ crepitation, 1+ Lachmans, with a firm end point.  The knee stable to varus and valgus and posterior stress with lateral patella tracking.  After this was done, the right leg was prepped using sterile Betadine and draped using sterile technique.  Originally through an inferolateral portal, the arthroscope with a pump attached was placed, and through an inferior medial portal, an arthroscopic probe was placed.  On initial inspection of the medial compartment, the  articular cartilage and medial femoral condyle and medial tibial plateau showed only mild grade 1-2 chondromalacia.  The medial meniscus was probed, and this was found to be intact.  The inner condylar notch was inspected.  The anterior cruciate ligament was moderately lax.  It appeared to be scarred possibly to the proximal aspect of the PCL, with 3.0 to 4.0 mm of anterior laxity, but there was an end point, and the majority of the ACL was intact, but this was considered a partial tear.  The lateral compartment was inspected.  She had 50%-60% grade 3 chondromalacia of the lateral femoral condyle with loose articular cartilage collapse, which was thoroughly debrided down to a stable base and a stable rim.  Grade 1-2 changes in the lateral tibial plateau and moderate 25%-30% grade 3 changes, which was debrided.  The lateral meniscus was probed, and this was found to be normal.  The patellofemoral joint was inspected.  She had 80% grade 3 changes on the patella, which was thoroughly debrided.  The femoral groove 50% changes, which was debrided.  There was significant lateral patella tracking noted, and thus it was felt that a lateral retinacular release would be indicated, and this was performed with the 90-degree ArthroCare Wand.  This significantly improved the patella tracking and decompressed the patellofemoral joint.  No excessive bleeding was encountered.  Moderate synovitis in the medial and lateral gutters was debrided.  Otherwise they were free of pathology.  After this was done, it  was felt that all pathology had been satisfactorily addressed.  The instruments were removed.  The portals were closed with #3-0 nylon sutures, and injected with 0.25% Marcaine with epinephrine and 4 mg of morphine. Sterile dressings were applied.  The patient was awakened and taken to the recovery room in stable condition.  FOLLOW-UP CARE:  Colleen Medina will be followed as an outpatient on Vicodin  and Vioxx.  I will see her back in the office in one week for sutures out and followup. DD:  05/22/01 TD:  05/22/01 Job: [id number]

## 2011-05-06 NOTE — Op Note (Signed)
   Colleen Medina, Colleen Medina                     ACCOUNT NO.:  [id number]   MEDICAL RECORD NO.:  [id number]                   PATIENT TYPE:  AMB   LOCATION:  ENDO                                 FACILITY:  [facility]   PHYSICIAN:  Georgiana Spinner, M.D.                 DATE OF BIRTH:  13-Feb-1941   DATE OF PROCEDURE:  DATE OF DISCHARGE:                                 OPERATIVE REPORT   PROCEDURE:  Colonoscopy.   INDICATIONS FOR PROCEDURE:  Colon cancer screening, diarrhea chronic.   ANESTHESIA:  Demerol 20, Versed 2 mg.   DESCRIPTION OF PROCEDURE:  With the patient mildly sedated in the left  lateral decubitus position, the Olympus videoscopic colonoscope was inserted  into the rectum and passed under direct vision to the cecum identified by  the ileocecal valve and appendiceal orifice both of which were photographed.  We entered into the terminal ileum and this too appeared normal and was  photographed. From this point, the colonoscope was then slowly withdrawn  taking circumferential views of the colonic mucosa stopping to take biopsies  randomly from what appeared to be normal appearing colonic mucosa to rule  out microscopic colitis. The endoscope was withdrawn all the way to the  rectum which appeared normal on direct and retroflexed view. The endoscope  was straightened and withdrawn. The patient's vital signs and pulse oximeter  remained stable. The patient tolerated the procedure well without apparent  complications.   FINDINGS:  Mild small mouth diverticulosis seen throughout the colon but  otherwise an unremarkable colonoscopic examination including the terminal  ileum.   PLAN:  Await biopsy report. The patient will call me for results of the  biopsy and followup with me as an outpatient.                                               Georgiana Spinner, M.D.    GMO/MEDQ  D:  10/29/2003  T:  10/29/2003  Job:  [id number]

## 2011-05-06 NOTE — Op Note (Signed)
   Colleen Medina, Colleen Medina                     ACCOUNT NO.:  [id number]   MEDICAL RECORD NO.:  [id number]                   PATIENT TYPE:  AMB   LOCATION:  ENDO                                 FACILITY:  [facility]   PHYSICIAN:  Georgiana Spinner, M.D.                 DATE OF BIRTH:  16-Apr-1941   DATE OF PROCEDURE:  DATE OF DISCHARGE:                                 OPERATIVE REPORT   PROCEDURE:  Upper endoscopy.   INDICATIONS:  Abdominal pain.   ANESTHESIA:  Demerol 40 mg, Versed 6 mg.   DESCRIPTION OF PROCEDURE:  With the patient mildly sedated in the left  lateral decubitus position, the Olympus videoscopic endoscope was inserted  in the mouth and passed under direct vision through the esophagus.  There  was a question of Barrett's esophagitis in one section of the squamocolumnar  junction and this was photographed and subsequently biopsied.  We entered  into the stomach.  The  fundus, body, antrum, duodenal bulb, and second  portion of the duodenum all appeared normal.  From this point, the endoscope  was slowly withdrawn taking circumferential views of the duodenal mucosa  until the endoscope was pulled back into the stomach, placed in retroflexion  and viewed the stomach from below and a widely patent lax gastroesophageal  junction was noted.  The endoscope was then straightened, withdrawn, taking  circumferential views of the remaining gastric and esophageal mucosa.  The  patient's vital signs and pulse oximetry remained stable.  The patient  tolerated the procedure well without apparent complication.   FINDINGS:  Question of Barrett's esophagitis, biopsied above a lax  gastroesophageal junction.   PLAN:  Await biopsy report.  The patient will call me for results and follow  up with me as an outpatient.  Proceed to colonoscopy.                                               Georgiana Spinner, M.D.    GMO/MEDQ  D:  10/29/2003  T:  10/29/2003  Job:  [id number]

## 2011-05-06 NOTE — Assessment & Plan Note (Signed)
[facility]                           GASTROENTEROLOGY OFFICE NOTE   NAME:Colleen Medina, Colleen Medina                  MRN:          [id number]  DATE:08/02/2006                            DOB:          10-Sep-1941    NEW PATIENT WORKUP:  The patient was self-referred following GERD study.   CHIEF COMPLAINT:  Heartburn, nausea.   HPI:  Ms. Torosyan is a pleasant, 70 year old woman, who I first met at the  time of a drug study endoscopy at the end of July.  This was a study for  GERD symptoms, which she self-enrolled in.  At the time of endoscopy, I  found that she had a large volume of retained solid food in her stomach.  She had a small hiatal hernia.  She had no evidence of esophagitis or  Barrett's.  The retained food suggested gastroparesis and that made her  ineligible for the drug study and I arranged for her to have a gastric  emptying scan shortly thereafter.  This gastric emptying scan showed that  she had dramatically slowed gastric emptying with 98% of the tracer  remaining in the stomach after one hour and 87% of the tracer remaining  after two hours.   Her symptoms are that of GERD on a chronic basis, as well as nausea after  eating and regurgitation when she lies down at night.   REVIEW OF SYSTEMS:  Shows essentially stable weight, is otherwise  essentially normal and is available on her nursing intake sheet.   PAST MEDICAL HISTORY:  Diabetes, hypertension, asthma, elevated cholesterol,  status post hysterectomy, tubal ligation, status post cholecystectomy, right  knee scope, tonsillectomy as a youth.  Of note, she has not been on any  medicines for her chronic illnesses for at least two to three years.   CURRENT MEDICATIONS:  None.   ALLERGIES:  PENICILLIN, SULFA.   SOCIAL HISTORY:  Married with three children.  Nonsmoker, nondrinker.   FAMILY HISTORY:  Mother with diabetes.  No colon cancer or colon polyps in  the family.   PHYSICAL EXAMINATION:  VITAL SIGNS:  The patient is 4 feet 11 inches, 157  pounds, blood pressure 132/84, pulse 72.  CONSTITUTIONAL:  Obese, otherwise well-appearing.  NEUROLOGIC:  Alert and oriented times three.  HEENT:  Eyes:  Extraocular muscles are intact.  Mouth:  Oropharynx is moist,  no lesions.  NECK:  The neck is supple, no lymphadenopathy.  CARDIOVASCULAR:  Heart regular rate and rhythm.  LUNGS:  Clear to auscultation bilaterally.  ABDOMEN:  Soft, nontender, nondistended.  Normal bowel sounds.  EXTREMITIES:  No lower extremity edema.  SKIN:  No rashes or lesions visible on extremities.   ASSESSMENT AND PLAN:  The patient is a 70 year old woman with, likely,  diabetic-related gastroparesis.   First, I think what is most important is that Ms. Legate resume care for  her chronic medical issues, such as diabetes, elevated cholesterol and  hypertension.  It is quite likely that she has diabetic-related  gastroparesis.  She has not been seeing her primary care physician due to  some insurance problems and has not been on  any medicines for these issues  for at least many months and probably more than a year or two.  It would be  very difficult to treat her gastroparesis if her blood sugars do not get  under better control.  That being said, I will start her on a low dose of  Reglan 5 mg three times a day, as well as I have recommended her to take  over-the-counter Prilosec once daily to help with her heartburn symptoms.  I  have also arranged for her to have CBC, complete metabolic profile,  hemoglobin J4N and a lipid panel today and I am arranging for her to meet a  new primary care physician, here at [facility].  Hopefully, we will be able to  get her medical issues under control and I suspect that, if she does have  very elevated blood sugars and hemoglobin A1c, that as that drops, her  stomach function will hopefully improve.  I will arrange for her also to see  me back in the  office in two months' time.                                   Rachael Fee, MD   DPJ/MedQ  DD:  08/02/2006  DT:  08/02/2006  Job #:  [id number]

## 2011-05-11 ENCOUNTER — Ambulatory Visit (HOSPITAL_COMMUNITY)
Admission: RE | Admit: 2011-05-11 | Discharge: 2011-05-11 | Disposition: A | Discharge status: Home / Self Care | Payer: Medicare Other | Source: Ambulatory Visit | Attending: Gastroenterology | Admitting: Gastroenterology

## 2011-05-11 DIAGNOSIS — D1330 Benign neoplasm of unspecified part of small intestine: Secondary | ICD-10-CM | POA: Insufficient documentation

## 2011-05-19 ENCOUNTER — Other Ambulatory Visit (HOSPITAL_COMMUNITY): Payer: Self-pay | Admitting: Gastroenterology

## 2011-05-19 DIAGNOSIS — R112 Nausea with vomiting, unspecified: Secondary | ICD-10-CM

## 2011-06-09 ENCOUNTER — Encounter (HOSPITAL_COMMUNITY): Payer: Self-pay

## 2011-06-09 ENCOUNTER — Encounter (HOSPITAL_COMMUNITY)
Admission: RE | Admit: 2011-06-09 | Discharge: 2011-06-09 | Disposition: A | Discharge status: Home / Self Care | Payer: Medicare Other | Source: Ambulatory Visit | Attending: Gastroenterology | Admitting: Gastroenterology

## 2011-06-09 DIAGNOSIS — K3189 Other diseases of stomach and duodenum: Secondary | ICD-10-CM | POA: Insufficient documentation

## 2011-06-09 DIAGNOSIS — R1013 Epigastric pain: Secondary | ICD-10-CM | POA: Insufficient documentation

## 2011-06-09 DIAGNOSIS — R112 Nausea with vomiting, unspecified: Secondary | ICD-10-CM | POA: Insufficient documentation

## 2011-06-09 MED ORDER — TECHNETIUM TC 99M SULFUR COLLOID
2.1000 | Freq: Once | INTRAVENOUS | Status: AC | PRN
  Administered 2011-06-09: 2.1 via INTRAVENOUS

## 2011-07-07 ENCOUNTER — Encounter: Payer: Self-pay | Admitting: Physician Assistant

## 2011-07-11 ENCOUNTER — Encounter: Payer: Medicare Other | Admitting: Physician Assistant

## 2011-07-15 ENCOUNTER — Ambulatory Visit (INDEPENDENT_AMBULATORY_CARE_PROVIDER_SITE_OTHER): Payer: Medicare Other | Admitting: Physician Assistant

## 2011-07-15 ENCOUNTER — Encounter: Payer: Self-pay | Admitting: Physician Assistant

## 2011-07-15 VITALS — BP 144/80 | HR 61 | Resp 18 | Ht 59.0 in | Wt 128.8 lb

## 2011-07-15 DIAGNOSIS — Z01810 Encounter for preprocedural cardiovascular examination: Secondary | ICD-10-CM

## 2011-07-15 DIAGNOSIS — R9431 Abnormal electrocardiogram [ECG] [EKG]: Secondary | ICD-10-CM

## 2011-07-15 NOTE — Patient Instructions (Signed)
Your physician recommends that you schedule a follow-up appointment in: AS NEEDED  

## 2011-07-15 NOTE — Progress Notes (Signed)
History of Present Illness: Primary Cardiologist:  Dr. Zackery Barefoot  Colleen Medina is a 70 y.o. female who presents for surgical clearance.  She saw Dr Myrtis Ser in 2010 for surgical clearance for her shoulder.  She had an echo at that time that demonstrated an EF 65%, normal wall motion, mild to mod TR and PASP 34.  She was cleared for surgery and this was uneventful.  She has a PMH of DM2, HTN, HLP, asthma and GERD.  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  She needs a open excision of the polyp with gastrojejunostomy.  This will be with Dr. Johna Sheriff.  She denies chest pain.  She can mow her grass with a push mower or do all of her own house cleaning without chest symptoms or dyspnea.  No orthopnea, PND, syncope.  She has chronic lower extremity edema.  This is unchanged.      Past Medical History  Diagnosis Date  . Diabetes mellitus   . HTN (hypertension)   . HLD (hyperlipidemia) mixed  . Asthma   . Cellulitis and abscess of other specified site   . Seasonal allergies   . Constipation   . GERD (gastroesophageal reflux disease)   . Gastroparesis     noted by BGD 2007, confirmed on gastric emptying scan   . Family history of early CAD     echo 4/10: EF 65 %, normal wall motion, mild to mod TR, PASP 34    Past Surgical History  Procedure Date  . Vesicovaginal fistula closure w/ tah   . Tubal ligation   . Cholecystectomy   . Right knee arthroscopy   . Tonsillectomy   . Shoulder surgery   . Total abdominal hysterectomy      Current Outpatient Prescriptions  Medication Sig Dispense Refill  . albuterol (PROAIR HFA) 108 (90 BASE) MCG/ACT inhaler Inhale 2 puffs into the lungs 4 (four) times daily as needed.        Marland Kitchen albuterol (PROVENTIL) (2.5 MG/3ML) 0.083% nebulizer solution Take 2.5 mg by nebulization 4 (four) times daily as needed.        . Enalapril-Hydrochlorothiazide 5-12.5 MG per tablet Take 1 tablet by mouth daily.        . metFORMIN (GLUCOPHAGE) 500 MG  tablet Take 500 mg by mouth 2 (two) times daily.        . metoCLOPramide (REGLAN) 10 MG tablet Take 10 mg by mouth daily.       . Omega-3 Fatty Acids (FISH OIL) 1000 MG CAPS Take 1 capsule by mouth daily.        Marland Kitchen omeprazole (PRILOSEC) 20 MG capsule Take 20 mg by mouth daily.        . simvastatin (ZOCOR) 40 MG tablet Take 40 mg by mouth at bedtime.        . sucralfate (CARAFATE) 1 G tablet Take 1 g by mouth 4 (four) times daily -  before meals and at bedtime.        . traMADol (ULTRAM) 50 MG tablet Take 50 mg by mouth 4 (four) times daily as needed.          Allergies: Allergies  Allergen Reactions  . Penicillins   . Sulfonamide Derivatives     Social history:  She is a nonsmoker.  She works as a Lawyer.  Family history:  Father died of myocardial infarction in 74s.  Her mother has angina and is alive at age 71.  ROS:   Please see  the history of present illness.  She has some acid reflux.  All other systems reviewed and negative.   Vital Signs: BP 144/80  Pulse 61  Resp 18  Ht 4\' 11"  (1.499 m)  Wt 128 lb 12.8 oz (58.423 kg)  BMI 26.01 kg/m2  PHYSICAL EXAM: Well nourished, well developed, in no acute distress HEENT: normal Neck: no JVD Vascular: no carotid bruits Cardiac:  normal S1, S2; RRR; no murmur Lungs:  clear to auscultation bilaterally, no wheezing, rhonchi or rales Abd: soft, nontender, no hepatomegaly Ext: trace to 1+ ankle edema Skin: warm and dry Neuro:  CNs 2-12 intact, no focal abnormalities noted Psych: normal affect  EKG:  Normal sinus rhythm, heart rate 61, left axis deviation, T wave inversions in 2, 3, aVF, V3-V5; no significant change when compared to prior tracings.  ASSESSMENT AND PLAN:

## 2011-07-15 NOTE — Assessment & Plan Note (Signed)
She has an abnormal EKG.  She was evaluated with an echocardiogram in 2010.  There has been no significant change in her EKG since 2010.  The echo demonstrated normal LV function.  She does not have any unstable cardiac conditions at this time.  She denies any history of angina.  She is quite active without significant functional limitations.  She can achieve more than 4 METS without chest pain or dyspnea.  According to ACC/AHA guidelines, she requires no further cardiac workup prior to noncardiac surgery.  She should be acceptable risk.  Our service will be available in the perioperative period as necessary.

## 2011-07-22 ENCOUNTER — Encounter (HOSPITAL_COMMUNITY): Payer: Medicare Other

## 2011-07-22 ENCOUNTER — Other Ambulatory Visit (INDEPENDENT_AMBULATORY_CARE_PROVIDER_SITE_OTHER): Payer: Self-pay | Admitting: General Surgery

## 2011-07-22 ENCOUNTER — Ambulatory Visit (HOSPITAL_COMMUNITY)
Admission: RE | Admit: 2011-07-22 | Discharge: 2011-07-22 | Disposition: A | Discharge status: Home / Self Care | Payer: Medicare Other | Source: Ambulatory Visit | Attending: General Surgery | Admitting: General Surgery

## 2011-07-22 DIAGNOSIS — I10 Essential (primary) hypertension: Secondary | ICD-10-CM | POA: Insufficient documentation

## 2011-07-22 DIAGNOSIS — Z01818 Encounter for other preprocedural examination: Secondary | ICD-10-CM

## 2011-07-22 DIAGNOSIS — Z79899 Other long term (current) drug therapy: Secondary | ICD-10-CM | POA: Insufficient documentation

## 2011-07-22 DIAGNOSIS — Z01812 Encounter for preprocedural laboratory examination: Secondary | ICD-10-CM | POA: Insufficient documentation

## 2011-07-22 DIAGNOSIS — Z9089 Acquired absence of other organs: Secondary | ICD-10-CM | POA: Insufficient documentation

## 2011-07-22 DIAGNOSIS — I517 Cardiomegaly: Secondary | ICD-10-CM | POA: Insufficient documentation

## 2011-07-22 DIAGNOSIS — D1330 Benign neoplasm of unspecified part of small intestine: Secondary | ICD-10-CM | POA: Insufficient documentation

## 2011-07-22 LAB — CBC
MCH: 24.1 pg — ABNORMAL LOW (ref 26.0–34.0)
MCV: 78.6 fL (ref 78.0–100.0)
Platelets: 275 10*3/uL (ref 150–400)
RDW: 14.1 % (ref 11.5–15.5)
WBC: 4.2 10*3/uL (ref 4.0–10.5)

## 2011-07-22 LAB — URINALYSIS, ROUTINE W REFLEX MICROSCOPIC
Ketones, ur: NEGATIVE mg/dL
Leukocytes, UA: NEGATIVE
Nitrite: NEGATIVE
Protein, ur: NEGATIVE mg/dL

## 2011-07-22 LAB — DIFFERENTIAL
Basophils Absolute: 0 10*3/uL (ref 0.0–0.1)
Eosinophils Absolute: 0.1 10*3/uL (ref 0.0–0.7)
Eosinophils Relative: 3 % (ref 0–5)
Lymphocytes Relative: 48 % — ABNORMAL HIGH (ref 12–46)

## 2011-07-22 LAB — COMPREHENSIVE METABOLIC PANEL
AST: 16 U/L (ref 0–37)
Albumin: 4 g/dL (ref 3.5–5.2)
Calcium: 9.7 mg/dL (ref 8.4–10.5)
Creatinine, Ser: 0.66 mg/dL (ref 0.50–1.10)

## 2011-07-22 LAB — SURGICAL PCR SCREEN: Staphylococcus aureus: NEGATIVE

## 2011-07-25 ENCOUNTER — Other Ambulatory Visit (INDEPENDENT_AMBULATORY_CARE_PROVIDER_SITE_OTHER): Payer: Self-pay

## 2011-07-25 DIAGNOSIS — E876 Hypokalemia: Secondary | ICD-10-CM

## 2011-07-25 MED ORDER — POTASSIUM CHLORIDE 20 MEQ PO PACK: 40.0000 meq | PACK | Freq: Once | ORAL | Status: DC

## 2011-07-26 ENCOUNTER — Inpatient Hospital Stay (HOSPITAL_COMMUNITY)
Admission: RE | Admit: 2011-07-26 | Discharge: 2011-08-02 | DRG: 327 | Disposition: A | Discharge status: Home / Self Care | Payer: Medicare Other | Source: Ambulatory Visit | Attending: General Surgery | Admitting: General Surgery

## 2011-07-26 ENCOUNTER — Other Ambulatory Visit (INDEPENDENT_AMBULATORY_CARE_PROVIDER_SITE_OTHER): Payer: Self-pay | Admitting: General Surgery

## 2011-07-26 DIAGNOSIS — Z880 Allergy status to penicillin: Secondary | ICD-10-CM

## 2011-07-26 DIAGNOSIS — K219 Gastro-esophageal reflux disease without esophagitis: Secondary | ICD-10-CM | POA: Diagnosis present

## 2011-07-26 DIAGNOSIS — E1149 Type 2 diabetes mellitus with other diabetic neurological complication: Secondary | ICD-10-CM | POA: Diagnosis present

## 2011-07-26 DIAGNOSIS — J45909 Unspecified asthma, uncomplicated: Secondary | ICD-10-CM | POA: Diagnosis present

## 2011-07-26 DIAGNOSIS — E785 Hyperlipidemia, unspecified: Secondary | ICD-10-CM | POA: Diagnosis present

## 2011-07-26 DIAGNOSIS — D371 Neoplasm of uncertain behavior of stomach: Secondary | ICD-10-CM

## 2011-07-26 DIAGNOSIS — I10 Essential (primary) hypertension: Secondary | ICD-10-CM | POA: Diagnosis present

## 2011-07-26 DIAGNOSIS — Z79899 Other long term (current) drug therapy: Secondary | ICD-10-CM

## 2011-07-26 DIAGNOSIS — Z01812 Encounter for preprocedural laboratory examination: Secondary | ICD-10-CM

## 2011-07-26 DIAGNOSIS — K3184 Gastroparesis: Secondary | ICD-10-CM

## 2011-07-26 DIAGNOSIS — D1330 Benign neoplasm of unspecified part of small intestine: Principal | ICD-10-CM | POA: Diagnosis present

## 2011-07-26 DIAGNOSIS — D378 Neoplasm of uncertain behavior of other specified digestive organs: Secondary | ICD-10-CM

## 2011-07-26 DIAGNOSIS — K560 Paralytic ileus: Secondary | ICD-10-CM | POA: Diagnosis not present

## 2011-07-26 DIAGNOSIS — D62 Acute posthemorrhagic anemia: Secondary | ICD-10-CM | POA: Diagnosis not present

## 2011-07-26 DIAGNOSIS — D375 Neoplasm of uncertain behavior of rectum: Secondary | ICD-10-CM

## 2011-07-26 HISTORY — PX: GASTROJEJUNOSTOMY: SHX1697

## 2011-07-26 LAB — GLUCOSE, CAPILLARY
Glucose-Capillary: 148 mg/dL — ABNORMAL HIGH (ref 70–99)
Glucose-Capillary: 182 mg/dL — ABNORMAL HIGH (ref 70–99)

## 2011-07-27 LAB — GLUCOSE, CAPILLARY
Glucose-Capillary: 103 mg/dL — ABNORMAL HIGH (ref 70–99)
Glucose-Capillary: 143 mg/dL — ABNORMAL HIGH (ref 70–99)
Glucose-Capillary: 109 mg/dL — ABNORMAL HIGH (ref 70–99)

## 2011-07-27 LAB — CBC
MCH: 23.7 pg — ABNORMAL LOW (ref 26.0–34.0)
Platelets: 230 10*3/uL (ref 150–400)
RBC: 3.71 MIL/uL — ABNORMAL LOW (ref 3.87–5.11)
RDW: 14.2 % (ref 11.5–15.5)

## 2011-07-27 LAB — COMPREHENSIVE METABOLIC PANEL
AST: 21 U/L (ref 0–37)
Alkaline Phosphatase: 48 U/L (ref 39–117)
BUN: 18 mg/dL (ref 6–23)
CO2: 29 mEq/L (ref 19–32)
Chloride: 102 mEq/L (ref 96–112)
Creatinine, Ser: 0.55 mg/dL (ref 0.50–1.10)
GFR calc non Af Amer: >60 mL/min (ref >60)
Total Bilirubin: 0.8 mg/dL (ref 0.3–1.2)

## 2011-07-28 LAB — CBC
HCT: 24.4 % — ABNORMAL LOW (ref 36.0–46.0)
MCV: 80.8 fL (ref 78.0–100.0)
RBC: 3.02 MIL/uL — ABNORMAL LOW (ref 3.87–5.11)
WBC: 6.9 10*3/uL (ref 4.0–10.5)

## 2011-07-28 LAB — GLUCOSE, CAPILLARY
Glucose-Capillary: 74 mg/dL (ref 70–99)
Glucose-Capillary: 59 mg/dL — ABNORMAL LOW (ref 70–99)

## 2011-07-28 LAB — HEMOGLOBIN AND HEMATOCRIT, BLOOD
HCT: 24 % — ABNORMAL LOW (ref 36.0–46.0)
Hemoglobin: 7.4 g/dL — ABNORMAL LOW (ref 12.0–15.0)

## 2011-07-29 LAB — CBC
MCH: 24.3 pg — ABNORMAL LOW (ref 26.0–34.0)
MCV: 79.7 fL (ref 78.0–100.0)
Platelets: 197 10*3/uL (ref 150–400)
RDW: 14.2 % (ref 11.5–15.5)

## 2011-07-29 LAB — BASIC METABOLIC PANEL
CO2: 32 mEq/L (ref 19–32)
Calcium: 8.5 mg/dL (ref 8.4–10.5)
Creatinine, Ser: 0.63 mg/dL (ref 0.50–1.10)
GFR calc Af Amer: >60 mL/min (ref >60)

## 2011-07-29 LAB — GLUCOSE, CAPILLARY
Glucose-Capillary: 105 mg/dL — ABNORMAL HIGH (ref 70–99)
Glucose-Capillary: 95 mg/dL (ref 70–99)

## 2011-07-30 LAB — GLUCOSE, CAPILLARY
Glucose-Capillary: 111 mg/dL — ABNORMAL HIGH (ref 70–99)
Glucose-Capillary: 115 mg/dL — ABNORMAL HIGH (ref 70–99)

## 2011-07-30 LAB — HEMOGLOBIN AND HEMATOCRIT, BLOOD: HCT: 24.8 % — ABNORMAL LOW (ref 36.0–46.0)

## 2011-07-31 LAB — GLUCOSE, CAPILLARY
Glucose-Capillary: 105 mg/dL — ABNORMAL HIGH (ref 70–99)
Glucose-Capillary: 126 mg/dL — ABNORMAL HIGH (ref 70–99)
Glucose-Capillary: 94 mg/dL (ref 70–99)

## 2011-07-31 NOTE — Op Note (Signed)
[name], Colleen Medina             ACCOUNT NO.:  [id number]  MEDICAL RECORD NO.:  [id number]  LOCATION:  1604                         FACILITY:  [facility]  PHYSICIAN:  Sharlet Salina T. Demarrio Menges, M.D.DATE OF BIRTH:  1941/10/19  DATE OF PROCEDURE: DATE OF DISCHARGE:                              OPERATIVE REPORT   PREOPERATIVE DIAGNOSES: 1. Duodenal polyp. 2. Diabetic gastroparesis.  POSTOPERATIVE DIAGNOSES: 1. Duodenal polyp. 2. Diabetic gastroparesis.  SURGICAL PROCEDURES: 1. Duodenotomy and excision of polyp. 2. Gastrojejunostomy.  SURGEON:  Glenna Fellows, M.D.  ASSISTANT:  Dr. Gaynelle Adu.  BRIEF HISTORY:  Ms. Medina is a 70 year old female with a history of diabetes and a long history of significant gastroparesis, well worked up by Dr. Randa Evens.  This has been documented on multiple previous studies. She has periodic nausea and vomiting, requiring trip to the emergency room several times a month.  She has had several normal previous endoscopies.  However, recent endoscopy was significant for an approximately 3 cm polyp in the duodenum described at the junction of the distal bulb with the C-loop measuring about 3.5 cm.  Biopsy revealed villous adenoma without atypia.  This was felt to be of proximal, separate from the ampulla.  She also had an endoscopic ultrasound showing the lesion proximal to the ampulla and confined to the mucosa. The patient was referred for surgical management and with this constellation of problems and after consultation with Dr. Randa Evens, we have elected to proceed with excision of her duodenal polyp and in addition, performed gastrojejunostomy to maximize drainage due to her gastroparesis.  The nature of the procedures, indications, risks of anesthetic complications, cardiopulmonary complications, leakage, infection, bleeding were discussed and understood and she desires to proceed.  She is brought to operating room for these  procedures.  DESCRIPTION OF OPERATION:  The patient brought to the operating room, placed in supine position on the operating table and general orotracheal anesthesia was induced.  She received preoperative IV antibiotics.  PAS were in place.  The abdomen was widely, sterilely prepped and draped. Correct patient and procedure were verified.  An upper midline incision was used and dissection carried down through the subcutaneous tissue and midline fascia using cautery.  The peritoneum was entered under direct vision.  There were some adhesions of the omentum and colon up to the gallbladder fossa from her previous laparoscopic cholecystectomy.  These were taken down with cautery and sharp dissection, and the duodenum and distal stomach exposed.  A Bookwalter retractor was placed.  Extensive Kocherization of the duodenum was then performed dividing lateral peritoneal attachments, and the duodenum and head of pancreas were Kocherized up out of the retroperitoneum, back over to the aorta. Palpating the duodenum, there did seem to be a small soft mass as described at the junction of the first and second portion of the duodenum, although it was really very subtle.  We then performed a duodenotomy longitudinally through the pylorus and extending down onto the first portion of the duodenum.  Initially, we were unable to see the polyp through this duodenotomy and feeling distally along the duodenum, it was not immediately obvious.  We then extended the duodenotomy distally about a centimeter and then we were able  to visualize the polyp in the location described at approximately the proximal portion of the C- loop just distal to the bulb lying posterolaterally.  We were able to grasp the polyp with Babcock and elevate it into the duodenotomy.  Using cautery, we then marked normal mucosa around all sides of the polyp in preparation for excision.  I then used cautery to completely excise the polyp  back to normal-appearing mucosa.  We felt we were keeping this in the submucosa.  After the polyp was excised and actually during the excision, the edges of the polypectomy site were closed with interrupted 3-0 Vicryl.  After the polyp was excised; however, it was noted that there was a defect in the duodenum about a centimeter more distally laterally in the duodenum.  This was a fairly thin area of the duodenum and on excising the polyp, we had clearly got full thickness here.  I therefore excised the small bridge of duodenal tissue between the duodenotomy and this defect.  This was sent as additional duodenal wall. This left a large duodenotomy, but we felt we could close quite adequately.  We could see and palpate the ampulla just distally and medial, and there was bile coming up from the distal duodenum. Following this, the pyloroplasty and duodenotomy were closed transversely with interrupted 2-0 silk modified Gambee sutures.  This clearly provided a broad opening to the gastric outlet.  The tissue was healthy and closed without any undue tension, although again it was a very long suture line.  Following this, this area was irrigated and hemostasis assured.  We then turned attention to the gastrojejunostomy. The ligament of Treitz was identified and a mobile portion of the jejunum about 15-20 cm distal from the ligament of Treitz was chosen for the gastrojejunostomy.  We elected to do this retrocolic.  The lesser sac was opened, mobilizing the greater curve, divided lesser omentum between clamps and tied with 2-0 silk ties until the lesser sac was certainly widely opened.  An avascular portion of the transverse mesocolon was chosen and was incised and opening created, and the jejunum brought up in the lesser sac retrocolic.  A gastrojejunostomy was then created along the most dependent posterior wall of the stomach with a single firing of the GIA 75 mm stapler.  Staple line was  intact. Anastomosis widely patent with no bleeding.  The common enterotomy was then closed with a full-thickness running 2-0 chromic, begun either end and tied centrally, and interrupted seromuscular silk sutures.  The gastrojejunostomy was then brought down below the mesocolon.  Additional reinforcement suture was placed in the crotch of the stapled anastomosis.  The mesocolon was then tacked to the gastric wall circumferentially to keep the anastomosis inferior to the mesocolon. There was no tension on this area at all.  We carefully inspected the duodenal suture line, which was intact without bleeding.  A closed suction drain was brought through separate stab wound and placed in Morison's pouch.  Tisseel tissue sealant was used over the duodenal suture line.  The viscera returned to the anatomic position.  There was no bleeding.  On-Q pain catheters were placed in the preperitoneal space along either side of the incision.  The fascia was closed with running #1 PDS beginning the incision tied centrally.  The subcu was irrigated and skin closed with staples. Sponge and needle counts were correct.  A nasogastric tube had been positioned in the stomach just proximal to the gastrojejunostomy. Dressings were applied.  The patient  was taken to the recovery in good condition.     Lorne Skeens. Lavena Loretto, M.D.     Tory Emerald  D:  07/26/2011  T:  07/27/2011  Job:  [id number]  cc:   Fayrene Fearing L. Malon Kindle., M.D. Fax: [phone]  Electronically Signed by Glenna Fellows M.D. on 07/31/2011 09:54:05 AM

## 2011-08-01 LAB — GLUCOSE, CAPILLARY: Glucose-Capillary: 106 mg/dL — ABNORMAL HIGH (ref 70–99)

## 2011-08-02 LAB — GLUCOSE, CAPILLARY: Glucose-Capillary: 163 mg/dL — ABNORMAL HIGH (ref 70–99)

## 2011-08-05 ENCOUNTER — Ambulatory Visit (INDEPENDENT_AMBULATORY_CARE_PROVIDER_SITE_OTHER): Payer: Medicare Other

## 2011-08-05 DIAGNOSIS — Z980 Intestinal bypass and anastomosis status: Secondary | ICD-10-CM

## 2011-08-05 NOTE — Progress Notes (Deleted)
Subjective:     Patient ID: Colleen Medina, female   DOB: Oct 30, 1941, 70 y.o.   MRN: [id number]  HPI   Review of Systems     Objective:   Physical Exam     Assessment:     ***    Plan:     ***

## 2011-08-08 NOTE — Progress Notes (Signed)
Removed staples & placed steri strips at incision area, incision intact, no redness/drainage.  Dr. Johna Sheriff briefly spoke with Colleen Medina at the time of her staple removal.

## 2011-08-20 ENCOUNTER — Inpatient Hospital Stay (HOSPITAL_COMMUNITY)
Admission: EM | Admit: 2011-08-20 | Discharge: 2011-08-23 | DRG: 074 | Disposition: A | Discharge status: Home / Self Care | Payer: Medicare Other | Attending: General Surgery | Admitting: General Surgery

## 2011-08-20 ENCOUNTER — Emergency Department (HOSPITAL_COMMUNITY): Payer: Medicare Other

## 2011-08-20 DIAGNOSIS — I10 Essential (primary) hypertension: Secondary | ICD-10-CM | POA: Diagnosis present

## 2011-08-20 DIAGNOSIS — J45909 Unspecified asthma, uncomplicated: Secondary | ICD-10-CM | POA: Diagnosis present

## 2011-08-20 DIAGNOSIS — E7800 Pure hypercholesterolemia, unspecified: Secondary | ICD-10-CM | POA: Diagnosis present

## 2011-08-20 DIAGNOSIS — E1149 Type 2 diabetes mellitus with other diabetic neurological complication: Principal | ICD-10-CM | POA: Diagnosis present

## 2011-08-20 DIAGNOSIS — K3184 Gastroparesis: Secondary | ICD-10-CM | POA: Diagnosis present

## 2011-08-20 DIAGNOSIS — R112 Nausea with vomiting, unspecified: Secondary | ICD-10-CM | POA: Diagnosis present

## 2011-08-21 ENCOUNTER — Emergency Department (HOSPITAL_COMMUNITY): Payer: Medicare Other

## 2011-08-21 LAB — URINALYSIS, ROUTINE W REFLEX MICROSCOPIC
Glucose, UA: NEGATIVE mg/dL
Ketones, ur: 15 mg/dL — AB
Leukocytes, UA: NEGATIVE
pH: 5.5 (ref 5.0–8.0)

## 2011-08-21 LAB — POCT I-STAT, CHEM 8
BUN: 26 mg/dL — ABNORMAL HIGH (ref 6–23)
Calcium, Ion: 1.15 mmol/L (ref 1.12–1.32)
Chloride: 96 mEq/L (ref 96–112)
Creatinine, Ser: 1 mg/dL (ref 0.50–1.10)
Glucose, Bld: 134 mg/dL — ABNORMAL HIGH (ref 70–99)
HCT: 39 % (ref 36.0–46.0)
Hemoglobin: 13.3 g/dL (ref 12.0–15.0)
Potassium: 3.5 mEq/L (ref 3.5–5.1)
Sodium: 133 mEq/L — ABNORMAL LOW (ref 135–145)
TCO2: 27 mmol/L (ref 0–100)

## 2011-08-21 LAB — CBC
MCV: 77.4 fL — ABNORMAL LOW (ref 78.0–100.0)
Platelets: 373 10*3/uL (ref 150–400)
RBC: 4.42 MIL/uL (ref 3.87–5.11)
WBC: 5.8 10*3/uL (ref 4.0–10.5)

## 2011-08-21 LAB — GLUCOSE, CAPILLARY
Glucose-Capillary: 158 mg/dL — ABNORMAL HIGH (ref 70–99)
Glucose-Capillary: 107 mg/dL — ABNORMAL HIGH (ref 70–99)
Glucose-Capillary: 110 mg/dL — ABNORMAL HIGH (ref 70–99)

## 2011-08-21 LAB — DIFFERENTIAL
Eosinophils Absolute: 0 10*3/uL (ref 0.0–0.7)
Lymphocytes Relative: 22 % (ref 12–46)
Lymphs Abs: 1.3 10*3/uL (ref 0.7–4.0)
Neutro Abs: 4.3 10*3/uL (ref 1.7–7.7)
Neutrophils Relative %: 74 % (ref 43–77)

## 2011-08-21 LAB — HEPATIC FUNCTION PANEL
AST: 18 U/L (ref 0–37)
Albumin: 4.5 g/dL (ref 3.5–5.2)
Total Bilirubin: 0.9 mg/dL (ref 0.3–1.2)

## 2011-08-21 MED ORDER — IOHEXOL 300 MG/ML  SOLN
100.0000 mL | Freq: Once | INTRAMUSCULAR | Status: AC | PRN
  Administered 2011-08-21: 100 mL via INTRAVENOUS

## 2011-08-22 LAB — BASIC METABOLIC PANEL
Chloride: 102 mEq/L (ref 96–112)
GFR calc Af Amer: >60 mL/min (ref >60)
GFR calc non Af Amer: >60 mL/min (ref >60)
Glucose, Bld: 94 mg/dL (ref 70–99)
Potassium: 3.7 mEq/L (ref 3.5–5.1)
Sodium: 135 mEq/L (ref 135–145)

## 2011-08-22 LAB — GLUCOSE, CAPILLARY: Glucose-Capillary: 123 mg/dL — ABNORMAL HIGH (ref 70–99)

## 2011-08-22 LAB — URINE CULTURE: Colony Count: 35000

## 2011-08-26 ENCOUNTER — Encounter (INDEPENDENT_AMBULATORY_CARE_PROVIDER_SITE_OTHER): Payer: Self-pay

## 2011-08-26 ENCOUNTER — Ambulatory Visit (INDEPENDENT_AMBULATORY_CARE_PROVIDER_SITE_OTHER): Payer: Medicare Other | Admitting: General Surgery

## 2011-08-26 VITALS — BP 124/76 | HR 72

## 2011-08-26 DIAGNOSIS — Z9889 Other specified postprocedural states: Secondary | ICD-10-CM

## 2011-08-26 NOTE — H&P (Signed)
Colleen Medina             ACCOUNT NO.:  [id number]  MEDICAL RECORD NO.:  [id number]  LOCATION:  1603                         FACILITY:  [facility]  PHYSICIAN:  Sharlet Salina T. Delmer Kowalski, M.D.DATE OF BIRTH:  1941/10/18  DATE OF ADMISSION:  08/20/2011 DATE OF DISCHARGE:                             HISTORY & PHYSICAL   CHIEF COMPLAINT:  Nausea and vomiting.  HISTORY OF PRESENT ILLNESS:  Colleen Medina is a 70 year old female with a long history of gastroparesis, felt secondary to diabetes.  She has had multiple admissions for nausea and vomiting.  She is 1 month status post laparotomy and transduodenal resection of a duodenal villous adenoma.  At that time, she also underwent gastrojejunostomy due to her history of gastroparesis.  The patient did well postoperatively and was discharged about 3 weeks ago.  She has been seen in the office doing well.  She, however, presented to the emergency room last night withabout 3-4 days of persistent nausea, vomiting, and some moderate epigastric pain.  She was admitted for this.  She had a bowel movement about 4 days ago.  She has felt somewhat "hot", but no definite fever or chills.  PAST MEDICAL HISTORY:  Previous surgery as above plus cholecystectomy, hysterectomy, knee arthroscopy, and tonsillectomy.  Medically, she is followed for adult-onset diabetes mellitus, hypertension, asthma, elevated cholesterol, and gastroparesis.  MEDICATIONS: 1. Albuterol 2 puffs q.i.d. p.r.n. 2. Enalapril/HCTZ 5-12.5 Daily. 3. Metformin 500 b.i.d. 4. Prilosec 20 mg daily. 5. Carafate 1 gram q.i.d. 6. Simvastatin daily. 7. Fish oil. 8. Reglan 10 mg q.i.d. 9. Tramadol p.r.n.  ALLERGIES:  PENICILLIN AND SULFA.  SOCIAL HISTORY:  No cigarettes or alcohol.  REVIEW OF SYSTEMS:  GENERAL:  Positive for some weakness and fatigue. HEENT:  Denies vision, hearing, or swallowing problems.  RESPIRATORY: No shortness of breath, cough, or wheezing.  CARDIAC:  No  chest pain or palpitations.  ABDOMEN:  As above.  GI:  As above.  PHYSICAL EXAMINATION:  VITAL SIGNS:  She is afebrile.  Heart rate 67, respirations 18, and blood pressure 151/79. GENERAL:  Alert, in no acute stress. SKIN:  Warm and dry.  No rash or infection. HEENT:  No palpable mass or thyromegaly.  Sclerae nonicteric. LYMPH NODES:  No cervical, subclavicular, or inguinal nodes palpable. LUNGS:  Clear without wheezing or increased work of breathing. CARDIAC:  Regular rate and rhythm.  No murmurs.  No JVD or edema. ABDOMEN:  Well-healed small upper midline incision.  Soft with minimal epigastric tenderness.  Nondistended.  No masses or organomegaly. EXTREMITIES:  No joint swelling deformity. NEUROLOGIC:  Alert and oriented x4.  Motor and sensory exams grossly normal.  LABORATORY DATA:  White count 5.8 and hemoglobin 10.4.  Urinalysis negative.  LFTs within normal limits.  Lipase 21.  CT scan of the abdomen and pelvis was performed in the emergency room. I have reviewed this.  This shows questionable slight thickening of the distal stomach around her gastrojejunostomy versus distention.  No abscess, obstruction, or other acute problems.  ASSESSMENT AND PLAN:  Nausea and vomiting.  She is postoperative resection of duodenal villous adenoma and a gastrojejunostomy.  I do not believe this represents a specific postoperative complication.  I  suspect this is an another episode of nausea or vomiting secondary to her gastroparesis.  The patient was admitted.  She was actually feeling currently better after medications.  We will gradually start clear liquid diet and resume her medications.     Colleen Medina. Colleen Medina, M.D.     Tory Emerald  D:  08/21/2011  T:  08/21/2011  Job:  [id number]  Electronically Signed by Glenna Fellows M.D. on 08/26/2011 05:54:36 PM

## 2011-08-26 NOTE — Progress Notes (Signed)
Patient returns approximately one month following resection of duodenal villous adenoma coupled with a gastrojejunostomy due to a long history of gastroparesis. She had been getting along well but I readmitted her to the hospital last week for 2 days of persistent vomiting. She improved quickly and was discharged several days ago. Since discharge she has been tolerating her soft diet with just one episode of nausea but no vomiting.  On exam today she does not appear ill. Abdomen is soft with minimal epigastric tenderness. Wound is well healed.  Assessment and plan: Overall I think she is doing okay without postoperative complication. I think we need more time to see if the gastrojejunostomy will help her recurrent bouts with gastroparesis. She will return to work in 2 weeks. I will see her back in 6 weeks.

## 2011-08-26 NOTE — Patient Instructions (Signed)
Eat small frequent meals. Call for any questions or concerns.

## 2011-09-26 NOTE — Discharge Summary (Signed)
NAMEMERILYN, Medina             ACCOUNT NO.:  [id number]  MEDICAL RECORD NO.:  [id number]  LOCATION:  1603                         FACILITY:  [facility]  PHYSICIAN:  Sharlet Salina T. Addelynn Batte, M.D.DATE OF BIRTH:  1941/11/17  DATE OF ADMISSION:  08/20/2011 DATE OF DISCHARGE:  08/23/2011                              DISCHARGE SUMMARY   DISCHARGE DIAGNOSES:  Gastroparesis, nausea, and vomiting.  OPERATIONS AND PROCEDURES:  None.  HISTORY OF PRESENT ILLNESS:  The patient is a 70 year old female with a long history of gastroparesis, felt secondary to diabetes.  She has had multiple previous admissions for nausea, vomiting, and gastroparesis. She is 1 month status post laparotomy and transduodenal resection of a duodenal villous adenoma that was discovered during workup for this problem.  At that time, she underwent gastrojejunostomy as well due to her history of gastroparesis.  The patient did well postoperatively and was discharged 3 weeks ago.  She has been seen in the office postoperatively and doing well.  She, however, presented to the emergency room on the evening of admission with 3-4 days of persistent nausea and vomiting and some epigastric pain.  She has not had any fever or chills.  Bowel movement about 4 days ago.  PAST MEDICAL HISTORY:  Medically, she is followed for, 1. Adult-onset diabetes mellitus. 2. Hypertension. 3. Asthma. 4. Elevated cholesterol. 5. Gastroparesis.  PAST SURGICAL HISTORY:  Surgery as above plus remote cholecystectomy, hysterectomy, knee arthroscopy, and tonsillectomy.  MEDICATIONS: 1. Albuterol 2 puffs q.i.d. 2. Enalapril/HCTZ 5/12.5 daily. 3. Metformin 500 mg b.i.d. 4. Prilosec 20 daily. 5. Carafate 1 g q.i.d. 6. Simvastatin daily. 7. Fish oil. 8. Reglan 10 mg q.i.d. 9. Tramadol p.r.n.  ALLERGIES: 1. PENICILLIN. 2. SULFA.  SOCIAL HISTORY:  Detailed in H and P.  FAMILY HISTORY:  Detailed in H and P.  REVIEW OF SYSTEMS:  Detailed in H  and P.  PERTINENT PHYSICAL EXAMINATION:  VITAL SIGNS:  She is afebrile.  Vital signs all within normal limits. GENERAL:  Does not appear acutely ill. ABDOMEN:  A well-healed upper midline incision.  Soft, minimal tenderness, nondistended.  LABORATORY DATA:  White count was 5.8, hemoglobin 10.4.  Urinalysis negative.  LFTs normal.  Lipase 21.  IMAGING STUDIES:  CT scan of the abdomen and pelvis was obtained in the emergency room, which shows some questionable slight thickening of the distal stomach around the gastrojejunostomy, but no abscess obstruction or other acute problems.  HOSPITAL COURSE:  The patient was admitted with an impression of probable episode of gastroparesis causing nausea, vomiting without any apparent postoperative complication.  She is made n.p.o., treated with IV fluids and antiemetics.  With this, she gradually improved, and by the 3rd denied nausea and vomiting and was started on clear liquids, which she tolerated well.  She was advanced to a soft diet, and by the 4th she was asymptomatic, tolerating a soft diet.  Abdomen was soft and nontender.  She was discharged at this time to continue routine followup as previously planned.  No change in medications.     Lorne Skeens. Levander Katzenstein, M.D.     Tory Emerald  D:  09/20/2011  T:  09/20/2011  Job:  [id number]  cc:  James L. Malon Kindle., M.D. Fax: [phone]  Electronically Signed by Glenna Fellows M.D. on 09/26/2011 12:49:01 PM

## 2011-09-30 ENCOUNTER — Ambulatory Visit (INDEPENDENT_AMBULATORY_CARE_PROVIDER_SITE_OTHER): Payer: Medicare Other | Admitting: General Surgery

## 2011-09-30 ENCOUNTER — Encounter (INDEPENDENT_AMBULATORY_CARE_PROVIDER_SITE_OTHER): Payer: Self-pay | Admitting: General Surgery

## 2011-09-30 VITALS — BP 116/76 | HR 80 | Temp 96.8°F | Resp 16 | Ht 59.0 in | Wt 116.5 lb

## 2011-09-30 DIAGNOSIS — Z9889 Other specified postprocedural states: Secondary | ICD-10-CM

## 2011-09-30 NOTE — Progress Notes (Signed)
Patient returns for more long-term followup status post resection of duodenal villous adenoma as well as gastrojejunostomy due to long-standing gastroparesis.  She states she has a tender area at the top of her incision which bothers her when she bends over or when close press against it. She has not required any trips to the hospital for vomiting in the last 4 weeks and she feels that she is eating better and eating more volume than she was prior to the surgery. She has gained a couple of pounds.  On exam she looks well. Abdomen is soft and nontender. There is a slight area of thickening at the top end of her incision but I think is the fascial suture knot. There is no evidence of hernia or infection  Assessment and plan: Doing well following resection of duodenal villous adenoma and gastrojejunostomy. At this early stage her gastroparesis symptoms seem improved post drainage procedure. I told her I think her wound discomfort will resolve over the next couple of months. If not she will return to see me as needed.

## 2011-09-30 NOTE — Patient Instructions (Signed)
Call as needed for problems or questions 

## 2012-01-19 ENCOUNTER — Emergency Department (HOSPITAL_COMMUNITY): Payer: Medicare Other

## 2012-01-19 ENCOUNTER — Encounter (HOSPITAL_COMMUNITY): Payer: Self-pay | Admitting: Emergency Medicine

## 2012-01-19 ENCOUNTER — Emergency Department (HOSPITAL_COMMUNITY)
Admission: EM | Admit: 2012-01-19 | Discharge: 2012-01-19 | Disposition: A | Discharge status: Home / Self Care | Payer: Medicare Other | Attending: Emergency Medicine | Admitting: Emergency Medicine

## 2012-01-19 ENCOUNTER — Other Ambulatory Visit: Payer: Self-pay

## 2012-01-19 DIAGNOSIS — R10819 Abdominal tenderness, unspecified site: Secondary | ICD-10-CM | POA: Insufficient documentation

## 2012-01-19 DIAGNOSIS — K3184 Gastroparesis: Secondary | ICD-10-CM | POA: Insufficient documentation

## 2012-01-19 DIAGNOSIS — R109 Unspecified abdominal pain: Secondary | ICD-10-CM | POA: Insufficient documentation

## 2012-01-19 DIAGNOSIS — I10 Essential (primary) hypertension: Secondary | ICD-10-CM | POA: Insufficient documentation

## 2012-01-19 DIAGNOSIS — Z9089 Acquired absence of other organs: Secondary | ICD-10-CM | POA: Insufficient documentation

## 2012-01-19 DIAGNOSIS — E860 Dehydration: Secondary | ICD-10-CM | POA: Insufficient documentation

## 2012-01-19 DIAGNOSIS — R1110 Vomiting, unspecified: Secondary | ICD-10-CM | POA: Insufficient documentation

## 2012-01-19 DIAGNOSIS — E119 Type 2 diabetes mellitus without complications: Secondary | ICD-10-CM | POA: Insufficient documentation

## 2012-01-19 LAB — URINALYSIS, ROUTINE W REFLEX MICROSCOPIC
Bilirubin Urine: NEGATIVE
Hgb urine dipstick: NEGATIVE
Protein, ur: NEGATIVE mg/dL
Urobilinogen, UA: 1 mg/dL (ref 0.0–1.0)

## 2012-01-19 LAB — DIFFERENTIAL
Basophils Relative: 0 % (ref 0–1)
Eosinophils Absolute: 0 10*3/uL (ref 0.0–0.7)
Monocytes Absolute: 0.2 10*3/uL (ref 0.1–1.0)
Monocytes Relative: 5 % (ref 3–12)

## 2012-01-19 LAB — GLUCOSE, CAPILLARY: Glucose-Capillary: 138 mg/dL — ABNORMAL HIGH (ref 70–99)

## 2012-01-19 LAB — CBC
HCT: 34.7 % — ABNORMAL LOW (ref 36.0–46.0)
Hemoglobin: 11 g/dL — ABNORMAL LOW (ref 12.0–15.0)
MCH: 23.5 pg — ABNORMAL LOW (ref 26.0–34.0)
MCHC: 31.7 g/dL (ref 30.0–36.0)
MCV: 74.1 fL — ABNORMAL LOW (ref 78.0–100.0)

## 2012-01-19 LAB — COMPREHENSIVE METABOLIC PANEL
ALT: 13 U/L (ref 0–35)
CO2: 25 mEq/L (ref 19–32)
Calcium: 9.9 mg/dL (ref 8.4–10.5)
Creatinine, Ser: 0.75 mg/dL (ref 0.50–1.10)
GFR calc Af Amer: >90 mL/min (ref >90)
GFR calc non Af Amer: 85 mL/min — ABNORMAL LOW (ref >90)
Glucose, Bld: 141 mg/dL — ABNORMAL HIGH (ref 70–99)
Sodium: 132 mEq/L — ABNORMAL LOW (ref 135–145)

## 2012-01-19 LAB — LACTIC ACID, PLASMA: Lactic Acid, Venous: 1.8 mmol/L (ref 0.5–2.2)

## 2012-01-19 MED ORDER — IOHEXOL 300 MG/ML  SOLN
100.0000 mL | Freq: Once | INTRAMUSCULAR | Status: AC | PRN
  Administered 2012-01-19: 100 mL via INTRAVENOUS

## 2012-01-19 MED ORDER — ONDANSETRON HCL 4 MG/2ML IJ SOLN
INTRAMUSCULAR | Status: AC
  Administered 2012-01-19: 8 mg via INTRAVENOUS
  Filled 2012-01-19: qty 2

## 2012-01-19 MED ORDER — ONDANSETRON HCL 4 MG/2ML IJ SOLN
4.0000 mg | Freq: Once | INTRAMUSCULAR | Status: DC
  Administered 2012-01-19: 10:00:00 via INTRAVENOUS

## 2012-01-19 MED ORDER — ONDANSETRON HCL 4 MG PO TABS: 4.0000 mg | ORAL_TABLET | Freq: Four times a day (QID) | ORAL | Status: AC

## 2012-01-19 MED ORDER — ONDANSETRON HCL 4 MG/2ML IJ SOLN
4.0000 mg | Freq: Once | INTRAMUSCULAR | Status: AC
  Administered 2012-01-19: 8 mg via INTRAVENOUS

## 2012-01-19 MED ORDER — METOCLOPRAMIDE HCL 10 MG PO TABS: 10.0000 mg | ORAL_TABLET | Freq: Four times a day (QID) | ORAL | Status: DC

## 2012-01-19 MED ORDER — SODIUM CHLORIDE 0.9 % IV BOLUS (SEPSIS)
1000.0000 mL | Freq: Once | INTRAVENOUS | Status: AC
  Administered 2012-01-19: 1000 mL via INTRAVENOUS

## 2012-01-19 MED ORDER — ONDANSETRON HCL 4 MG/2ML IJ SOLN
INTRAMUSCULAR | Status: AC
  Filled 2012-01-19: qty 2

## 2012-01-19 MED ORDER — MORPHINE SULFATE 4 MG/ML IJ SOLN
4.0000 mg | Freq: Once | INTRAMUSCULAR | Status: AC
  Administered 2012-01-19: 4 mg via INTRAVENOUS
  Filled 2012-01-19: qty 1

## 2012-01-19 MED ORDER — SODIUM CHLORIDE 0.9 % IV BOLUS (SEPSIS): 1000.0000 mL | Freq: Once | INTRAVENOUS | Status: DC

## 2012-01-19 MED ORDER — METOCLOPRAMIDE HCL 5 MG/ML IJ SOLN
10.0000 mg | Freq: Once | INTRAMUSCULAR | Status: AC
  Administered 2012-01-19: 10 mg via INTRAVENOUS
  Filled 2012-01-19: qty 2

## 2012-01-19 NOTE — ED Notes (Signed)
Family at bedside. 

## 2012-01-19 NOTE — ED Notes (Signed)
Patient transported to CT 

## 2012-01-19 NOTE — ED Notes (Signed)
Pt presenting to ed with c/o abdominal pain x 1 week with nausea and vomiting

## 2012-01-19 NOTE — ED Notes (Signed)
NS 1 liter infusing. Labs drawn and sent.

## 2012-01-19 NOTE — ED Notes (Signed)
Pt comfortable, resting,..provided with snack if pt tolerates well, will be d/c to home.

## 2012-01-19 NOTE — ED Provider Notes (Signed)
History     CSN: [id number]  Arrival date & time 01/19/12  0920   First MD Initiated Contact with Patient 01/19/12 [phone]      Chief Complaint  Patient presents with  . Abdominal Pain    (Consider location/radiation/quality/duration/timing/severity/associated sxs/prior treatment) Patient is a 71 y.o. female presenting with abdominal pain. The history is provided by the patient.  Abdominal Pain The primary symptoms of the illness include abdominal pain, nausea and vomiting. The primary symptoms of the illness do not include fever, fatigue, shortness of breath, diarrhea, dysuria, vaginal discharge or vaginal bleeding. The current episode started more than 2 days ago (1 week but has gotten progressively worse). The onset of the illness was gradual. The problem has been gradually worsening.  The abdominal pain is generalized. The abdominal pain does not radiate. The abdominal pain is relieved by nothing. The abdominal pain is exacerbated by vomiting.  Nausea began 6 to 7 days ago. The nausea is associated with eating. The nausea is exacerbated by food.  Vomiting occurs 6 to 10 times per day. The emesis contains stomach contents.  Symptoms associated with the illness do not include chills, constipation, urgency, frequency or back pain. Significant associated medical issues include diabetes.    Past Medical History  Diagnosis Date  . Diabetes mellitus   . HTN (hypertension)   . HLD (hyperlipidemia) mixed  . Asthma   . Cellulitis and abscess of other specified site   . Seasonal allergies   . Constipation   . GERD (gastroesophageal reflux disease)   . Gastroparesis     noted by BGD 2007, confirmed on gastric emptying scan   . Family history of early CAD     echo 4/10: EF 65 %, normal wall motion, mild to mod TR, PASP 34    Past Surgical History  Procedure Date  . Vesicovaginal fistula closure w/ tah   . Tubal ligation   . Cholecystectomy   . Right knee arthroscopy   .  Tonsillectomy   . Shoulder surgery   . Total abdominal hysterectomy   . Duodenal polyp 07/26/11  . Gastrojejunostomy     07/26/11    Family History  Problem Relation Age of Onset  . Diabetes      family hx  . Coronary artery disease      family hx  . Colon cancer Neg Hx     History  Substance Use Topics  . Smoking status: Never Smoker   . Smokeless tobacco: Not on file   Comment: tobacco use - no  . Alcohol Use: No    OB History    Grav Para Term Preterm Abortions TAB SAB Ect Mult Living                  Review of Systems  Constitutional: Negative for fever, chills, activity change, appetite change and fatigue.  HENT: Negative for congestion, sore throat, rhinorrhea, neck pain and neck stiffness.   Respiratory: Negative for cough and shortness of breath.   Cardiovascular: Negative for chest pain and palpitations.  Gastrointestinal: Positive for nausea, vomiting and abdominal pain. Negative for diarrhea and constipation.  Genitourinary: Negative for dysuria, urgency, frequency, flank pain, vaginal bleeding and vaginal discharge.  Musculoskeletal: Negative for myalgias, back pain and arthralgias.  Neurological: Negative for dizziness, weakness, light-headedness, numbness and headaches.  All other systems reviewed and are negative.    Allergies  Sulfonamide derivatives and Penicillins  Home Medications   Current Outpatient Rx  Name Route Sig  Dispense Refill  . ALBUTEROL SULFATE HFA 108 (90 BASE) MCG/ACT IN AERS Inhalation Inhale 2 puffs into the lungs 4 (four) times daily as needed. For shortness of breath.    . DOCUSATE SODIUM 100 MG PO CAPS Oral Take 100 mg by mouth 2 (two) times daily as needed. For constipation.    . ENALAPRIL-HYDROCHLOROTHIAZIDE 5-12.5 MG PO TABS Oral Take 1 tablet by mouth daily.      Marland Kitchen METFORMIN HCL 500 MG PO TABS Oral Take 500 mg by mouth 2 (two) times daily.      Marland Kitchen METOCLOPRAMIDE HCL 10 MG PO TABS Oral Take 10 mg by mouth 3 (three) times  daily.     Marland Kitchen RIZATRIPTAN BENZOATE 5 MG PO TBDP Oral Take 5 mg by mouth as needed. Take 1 tablet at onset of headache. May repeat in 2 hours if needed. Maximum of 3 tablets per day.    . TRAMADOL HCL 50 MG PO TABS Oral Take 50 mg by mouth every 8 (eight) hours as needed. Pain.    . ALBUTEROL SULFATE (2.5 MG/3ML) 0.083% IN NEBU Nebulization Take 2.5 mg by nebulization 4 (four) times daily as needed. For shortness of breath.    . METOCLOPRAMIDE HCL 10 MG PO TABS Oral Take 1 tablet (10 mg total) by mouth every 6 (six) hours. 30 tablet 0  . OMEPRAZOLE 20 MG PO CPDR Oral Take 20 mg by mouth daily.      Marland Kitchen ONDANSETRON HCL 4 MG PO TABS Oral Take 1 tablet (4 mg total) by mouth every 6 (six) hours. 12 tablet 0  . SUCRALFATE 1 G PO TABS Oral Take 0.5 g by mouth 4 (four) times daily -  before meals and at bedtime.       BP 118/78  Pulse 61  Temp(Src) 97.8 F (36.6 C) (Axillary)  Resp 14  SpO2 100%  Physical Exam  Nursing note and vitals reviewed. Constitutional: She is oriented to person, place, and time. She appears well-developed and well-nourished.       Appears uncomfortable  HENT:  Head: Normocephalic and atraumatic.  Mouth/Throat: Oropharynx is clear and moist.  Eyes: Conjunctivae and EOM are normal. Pupils are equal, round, and reactive to light.  Neck: Normal range of motion. Neck supple.  Cardiovascular: Normal rate, regular rhythm, normal heart sounds and intact distal pulses.   Pulmonary/Chest: Effort normal and breath sounds normal.  Abdominal: Soft. Bowel sounds are normal. There is tenderness (diffuse). There is no rebound and no guarding.  Musculoskeletal: Normal range of motion. She exhibits no tenderness.  Neurological: She is alert and oriented to person, place, and time. No cranial nerve deficit.  Skin: Skin is warm and dry. No rash noted.    ED Course  Procedures (including critical care time)   Date: 01/19/2012  Rate: 83  Rhythm: normal sinus rhythm  QRS Axis: normal   Intervals: normal  ST/T Wave abnormalities: nonspecific T wave changes  Conduction Disutrbances:none  Narrative Interpretation:   Old EKG Reviewed: unchanged  Labs Reviewed  CBC - Abnormal; Notable for the following:    Hemoglobin 11.0 (*)    HCT 34.7 (*)    MCV 74.1 (*)    MCH 23.5 (*)    All other components within normal limits  COMPREHENSIVE METABOLIC PANEL - Abnormal; Notable for the following:    Sodium 132 (*)    Potassium 3.4 (*)    Chloride 92 (*)    Glucose, Bld 141 (*)    Total Protein 8.4 (*)  GFR calc non Af Amer 85 (*)    All other components within normal limits  URINALYSIS, ROUTINE W REFLEX MICROSCOPIC - Abnormal; Notable for the following:    Ketones, ur 40 (*)    All other components within normal limits  GLUCOSE, CAPILLARY - Abnormal; Notable for the following:    Glucose-Capillary 138 (*)    All other components within normal limits  DIFFERENTIAL  LIPASE, BLOOD  LACTIC ACID, PLASMA   Ct Abdomen Pelvis W Contrast  01/19/2012  *RADIOLOGY REPORT*  Clinical Data: .  Nausea and vomiting.  CT ABDOMEN AND PELVIS WITH CONTRAST  Technique:  Multidetector CT imaging of the abdomen and pelvis was performed following the standard protocol during bolus administration of intravenous contrast.  Contrast: OMNIPAQUE IOHEXOL 300 MG/ML IV SOLN  Comparison: 08/21/2011.  Findings: Enlargement the right heart is present.  Lung bases demonstrate dependent atelectasis.  Central air is present within the liver compatible with pneumobilia, likely associated with prior sphincterotomy.  This was not present on most recent prior exams.  Clinical correlation is recommended.  There may have been some pneumobilia present on the prior CT 04/02/2008.  Stable left hepatic lobe sub centimeter low density lesion.  Adrenal glands and kidneys appear normal.  Spleen normal.  Stomach and duodenum normal.  Small bowel normal.  Normal appendix.  Large stool burden is present in the ascending colon.   Distal colon appears within normal limits. Aortoiliac atherosclerosis. Hysterectomy.  The urinary bladder demonstrates mural thickening.  Clinically correlate with urinalysis.  This could represent cystitis.  No free fluid in the anatomic pelvis.  No aggressive osseous lesions.  IMPRESSION: 1.  Mural thickening of the urinary bladder.  Clinically correlate for cystitis. 2.  Pneumobilia not definitely present on prior examinations. Clinically correlate for history of sphincterotomy. 3.  Cholecystectomy with post cholecystectomy dilation of the common bile duct. 4.  Prominent right-sided stool burden.  Original Report Authenticated By: Andreas Newport, M.D.     1. Gastroparesis   2. Vomiting   3. Dehydration       MDM  Patient arrived with gastroparesis and vomiting. It was intractable. CT scan is relatively unremarkable. Pneumobilia is secondary to a sphincterotomy that she had in the past. She received 2 L of IV fluids, Reglan, Zofran, 4 mg of morphine with improvement of her symptoms. Laboratory studies are relatively unremarkable. Blood glucose was normal. She is able tolerate a meal one emergency department. She'll be discharged home with Reglan and Zofran instructed to followup with her primary care physician. I encouraged her to continue aggressive oral hydration at home.        Dayton Bailiff, MD 01/19/12 [phone]

## 2012-01-19 NOTE — ED Notes (Signed)
Pt with dry heaves, no emesis. MDP at bedside, Zofran 8mg  IV ordered.

## 2012-01-19 NOTE — ED Notes (Signed)
IV out and intact no problems and pt tolerated well

## 2012-01-25 ENCOUNTER — Other Ambulatory Visit: Payer: Self-pay | Admitting: Gastroenterology

## 2012-05-09 ENCOUNTER — Encounter: Payer: Self-pay | Admitting: Internal Medicine
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
## 2012-07-26 ENCOUNTER — Other Ambulatory Visit: Payer: Self-pay | Admitting: Internal Medicine

## 2012-07-26 DIAGNOSIS — R109 Unspecified abdominal pain: Secondary | ICD-10-CM

## 2012-07-26 DIAGNOSIS — R1030 Lower abdominal pain, unspecified: Secondary | ICD-10-CM

## 2012-07-27 ENCOUNTER — Ambulatory Visit
Admission: RE | Admit: 2012-07-27 | Discharge: 2012-07-27 | Disposition: A | Discharge status: Home / Self Care | Payer: Medicare Other | Source: Ambulatory Visit | Attending: Internal Medicine | Admitting: Internal Medicine

## 2012-07-27 DIAGNOSIS — R1030 Lower abdominal pain, unspecified: Secondary | ICD-10-CM

## 2012-07-27 DIAGNOSIS — R109 Unspecified abdominal pain: Secondary | ICD-10-CM

## 2012-08-10 ENCOUNTER — Other Ambulatory Visit: Payer: Self-pay | Admitting: Internal Medicine

## 2012-08-10 DIAGNOSIS — R109 Unspecified abdominal pain: Secondary | ICD-10-CM

## 2012-08-15 ENCOUNTER — Ambulatory Visit
Admission: RE | Admit: 2012-08-15 | Discharge: 2012-08-15 | Disposition: A | Discharge status: Home / Self Care | Payer: Medicare Other | Source: Ambulatory Visit | Attending: Internal Medicine | Admitting: Internal Medicine

## 2012-08-15 DIAGNOSIS — R109 Unspecified abdominal pain: Secondary | ICD-10-CM

## 2012-08-15 MED ORDER — IOHEXOL 300 MG/ML  SOLN
100.0000 mL | Freq: Once | INTRAMUSCULAR | Status: AC | PRN
  Administered 2012-08-15: 100 mL via INTRAVENOUS

## 2013-11-29 ENCOUNTER — Ambulatory Visit (INDEPENDENT_AMBULATORY_CARE_PROVIDER_SITE_OTHER): Payer: Medicare Other | Admitting: Internal Medicine

## 2013-11-29 ENCOUNTER — Encounter: Payer: Self-pay | Admitting: Internal Medicine

## 2013-11-29 VITALS — BP 137/79 | HR 62 | Temp 97.7°F | Ht 59.0 in | Wt 130.9 lb

## 2013-11-29 DIAGNOSIS — R51 Headache: Secondary | ICD-10-CM

## 2013-11-29 DIAGNOSIS — L989 Disorder of the skin and subcutaneous tissue, unspecified: Secondary | ICD-10-CM

## 2013-11-29 DIAGNOSIS — M79643 Pain in unspecified hand: Secondary | ICD-10-CM

## 2013-11-29 DIAGNOSIS — M1990 Unspecified osteoarthritis, unspecified site: Secondary | ICD-10-CM | POA: Insufficient documentation

## 2013-11-29 DIAGNOSIS — D649 Anemia, unspecified: Secondary | ICD-10-CM

## 2013-11-29 DIAGNOSIS — E119 Type 2 diabetes mellitus without complications: Secondary | ICD-10-CM

## 2013-11-29 DIAGNOSIS — Z0000 Encounter for general adult medical examination without abnormal findings: Secondary | ICD-10-CM | POA: Insufficient documentation

## 2013-11-29 DIAGNOSIS — K219 Gastro-esophageal reflux disease without esophagitis: Secondary | ICD-10-CM

## 2013-11-29 DIAGNOSIS — T7411XA Adult physical abuse, confirmed, initial encounter: Secondary | ICD-10-CM

## 2013-11-29 DIAGNOSIS — I10 Essential (primary) hypertension: Secondary | ICD-10-CM

## 2013-11-29 DIAGNOSIS — M25549 Pain in joints of unspecified hand: Secondary | ICD-10-CM

## 2013-11-29 DIAGNOSIS — T7491XA Unspecified adult maltreatment, confirmed, initial encounter: Secondary | ICD-10-CM | POA: Insufficient documentation

## 2013-11-29 DIAGNOSIS — IMO0002 Reserved for concepts with insufficient information to code with codable children: Secondary | ICD-10-CM

## 2013-11-29 DIAGNOSIS — G44229 Chronic tension-type headache, not intractable: Secondary | ICD-10-CM | POA: Insufficient documentation

## 2013-11-29 DIAGNOSIS — J45909 Unspecified asthma, uncomplicated: Secondary | ICD-10-CM

## 2013-11-29 DIAGNOSIS — Z23 Encounter for immunization: Secondary | ICD-10-CM

## 2013-11-29 DIAGNOSIS — R300 Dysuria: Secondary | ICD-10-CM

## 2013-11-29 HISTORY — DX: Unspecified osteoarthritis, unspecified site: M19.90

## 2013-11-29 HISTORY — DX: Chronic tension-type headache, not intractable: G44.229

## 2013-11-29 HISTORY — DX: Reserved for concepts with insufficient information to code with codable children: IMO0002

## 2013-11-29 LAB — CBC WITH DIFFERENTIAL/PLATELET
Basophils Absolute: 0 10*3/uL (ref 0.0–0.1)
Basophils Relative: 0 % (ref 0–1)
Eosinophils Relative: 2 % (ref 0–5)
HCT: 33.8 % — ABNORMAL LOW (ref 36.0–46.0)
Lymphocytes Relative: 51 % — ABNORMAL HIGH (ref 12–46)
MCHC: 31.4 g/dL (ref 30.0–36.0)
Monocytes Absolute: 0.4 10*3/uL (ref 0.1–1.0)
Neutro Abs: 1.7 10*3/uL (ref 1.7–7.7)
Platelets: 263 10*3/uL (ref 150–400)
RDW: 15.5 % (ref 11.5–15.5)
WBC: 4.5 10*3/uL (ref 4.0–10.5)

## 2013-11-29 LAB — COMPLETE METABOLIC PANEL WITH GFR
ALT: 10 U/L (ref 0–35)
AST: 18 U/L (ref 0–37)
Alkaline Phosphatase: 70 U/L (ref 39–117)
Calcium: 9.5 mg/dL (ref 8.4–10.5)
Creat: 0.84 mg/dL (ref 0.50–1.10)
GFR, Est Non African American: 71 mL/min
Total Bilirubin: 0.5 mg/dL (ref 0.3–1.2)

## 2013-11-29 LAB — LIPID PANEL: LDL Cholesterol: 175 mg/dL — ABNORMAL HIGH (ref 0–99)

## 2013-11-29 LAB — GLUCOSE, CAPILLARY: Glucose-Capillary: 91 mg/dL (ref 70–99)

## 2013-11-29 MED ORDER — ALBUTEROL SULFATE (2.5 MG/3ML) 0.083% IN NEBU: 2.5000 mg | INHALATION_SOLUTION | Freq: Four times a day (QID) | RESPIRATORY_TRACT | Status: DC | PRN

## 2013-11-29 MED ORDER — OMEPRAZOLE 20 MG PO CPDR: 20.0000 mg | DELAYED_RELEASE_CAPSULE | Freq: Every day | ORAL | Status: DC

## 2013-11-29 MED ORDER — ALBUTEROL SULFATE HFA 108 (90 BASE) MCG/ACT IN AERS: 2.0000 | INHALATION_SPRAY | Freq: Four times a day (QID) | RESPIRATORY_TRACT | Status: DC | PRN

## 2013-11-29 NOTE — Assessment & Plan Note (Signed)
BP Readings from Last 3 Encounters:  11/29/13 137/79  01/19/12 118/78  09/30/11 116/76    Lab Results  Component Value Date   NA 132* 01/19/2012   K 3.4* 01/19/2012   CREATININE 0.75 01/19/2012    Assessment: Blood pressure control: controlled Progress toward BP goal:  at goal Comments: none  Plan: Medications:  not taking any meds currently  Educational resources provided: handout Self management tools provided: other (see comments) Other plans: check CMET today

## 2013-11-29 NOTE — Addendum Note (Signed)
Addended by: Annett Gula on: 11/29/2013 03:38 PM   Modules accepted: Orders

## 2013-11-29 NOTE — Progress Notes (Signed)
Case discussed with Dr. McLean soon after the resident saw the patient.  We reviewed the resident's history and exam and pertinent patient test results.  I agree with the assessment, diagnosis, and plan of care documented in the resident's note. 

## 2013-11-29 NOTE — Assessment & Plan Note (Signed)
Healing sore to left calf Try Polysporin/Vasoline

## 2013-11-29 NOTE — Assessment & Plan Note (Signed)
See HPI for details  Put in referral to Lynnae January for help with resources

## 2013-11-29 NOTE — Assessment & Plan Note (Addendum)
Lab Results  Component Value Date   HGBA1C 6.2 11/29/2013     Assessment: Diabetes control: good control (HgbA1C at goal) (unsure checking HA1C today ) Progress toward A1C goal:  at goal Comments: none  Plan: Medications:  diet controlled, no meds  Home glucose monitoring: Frequency: no home glucose monitoring Timing: N/A Instruction/counseling given: reminded to get eye exam, reminded to bring medications to each visit and provided printed educational material Educational resources provided: handout Self management tools provided: other (see comments) Other plans: check HA1C today, foot exam today, urine Alb/Creatinine

## 2013-11-29 NOTE — Assessment & Plan Note (Signed)
Will check CBC today.  

## 2013-11-29 NOTE — Assessment & Plan Note (Signed)
H/a could be tension related to stress at home Return to clinic if bothering Pt does not want to continue Maxalt for now not helping Disc'ed Fiocet may help but pt does not want to try now Another option would be h/a clinic appt in the future

## 2013-11-29 NOTE — Assessment & Plan Note (Signed)
Will check UA, Urine culture today to r/o UTI

## 2013-11-29 NOTE — Assessment & Plan Note (Signed)
colonoscopy was in 05/2010 with repeat per note due in 10 years, will chk HA1C, lipid panel today, CMET, CBC, urine microalb/cr ratio, DEXA (was done in the past with previous PCP (per pt) obtaining records if not done will need one in the future).  Due for flu (flu given today) and pneumonia shots.  She needs to f/u with eye MD (recently saw Dr. Gilford Raid 2 months ago), in need of mammogram (last was 1 year ago).   -will get records from eye MD

## 2013-11-29 NOTE — Assessment & Plan Note (Signed)
With history will check lipid panel today
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
## 2013-11-29 NOTE — Assessment & Plan Note (Signed)
B/l hand pain could be related to OA with DIP nodule noted right hand 1st finger  Can try Tylenol of Advil prn  May need to image in the future

## 2013-11-29 NOTE — Assessment & Plan Note (Signed)
Rx refill Albuterol inhaler and neb

## 2013-11-29 NOTE — Patient Instructions (Addendum)
General Instructions: Please read all the information below Please pick up your medications from the pharmacy   Please follow up in 3 months with your regular doctor sooner if needed.  We may need to image your hands in the future if they continue to hurt  You may take Tylenol or Advil for pain if needed  Use Vasoline or Polysporin to sore on the back of the leg until it gets better  I will send a letter with lab results  Happy Holidays    Treatment Goals:  Goals (1 Years of Data) as of 11/29/13         As of Today 01/19/12     Blood Pressure    . Blood Pressure < 140/90  137/79 118/78     Result Component    . HEMOGLOBIN A1C < 7.0  6.2       Progress Toward Treatment Goals:  Treatment Goal 11/29/2013  Hemoglobin A1C at goal  Blood pressure at goal    Self Care Goals & Plans:  Self Care Goal 11/29/2013  Manage my medications take my medicines as prescribed; bring my medications to every visit; refill my medications on time; follow the sick day instructions if I am sick  Monitor my health keep track of my blood pressure; bring my blood pressure log to each visit; check my feet daily  Eat healthy foods drink diet soda or water instead of juice or soda; eat more vegetables; eat foods that are low in salt; eat baked foods instead of fried foods; eat fruit for snacks and desserts; eat smaller portions  Be physically active find an activity I enjoy  Meeting treatment goals maintain the current self-care plan    Home Blood Glucose Monitoring 11/29/2013  Check my blood sugar no home glucose monitoring  When to check my blood sugar N/A     Care Management & Community Referrals:  Referral 11/29/2013  Referrals made for care management support none needed  Referrals made to community resources none       DASH Diet The DASH diet stands for "Dietary Approaches to Stop Hypertension." It is a healthy eating plan that has been shown to reduce high blood pressure (hypertension)  in as little as 14 days, while also possibly providing other significant health benefits. These other health benefits include reducing the risk of breast cancer after menopause and reducing the risk of type 2 diabetes, heart disease, colon cancer, and stroke. Health benefits also include weight loss and slowing kidney failure in patients with chronic kidney disease.  DIET GUIDELINES  Limit salt (sodium). Your diet should contain less than 1500 mg of sodium daily.  Limit refined or processed carbohydrates. Your diet should include mostly whole grains. Desserts and added sugars should be used sparingly.  Include small amounts of heart-healthy fats. These types of fats include nuts, oils, and tub margarine. Limit saturated and trans fats. These fats have been shown to be harmful in the body. CHOOSING FOODS  The following food groups are based on a 2000 calorie diet. See your Registered Dietitian for individual calorie needs. Grains and Grain Products (6 to 8 servings daily)  Eat More Often: Whole-wheat bread, brown rice, whole-grain or wheat pasta, quinoa, popcorn without added fat or salt (air popped).  Eat Less Often: White bread, white pasta, white rice, cornbread. Vegetables (4 to 5 servings daily)  Eat More Often: Fresh, frozen, and canned vegetables. Vegetables may be raw, steamed, roasted, or grilled with a minimal amount of  fat.  Eat Less Often/Avoid: Creamed or fried vegetables. Vegetables in a cheese sauce. Fruit (4 to 5 servings daily)  Eat More Often: All fresh, canned (in natural juice), or frozen fruits. Dried fruits without added sugar. One hundred percent fruit juice ( cup [237 mL] daily).  Eat Less Often: Dried fruits with added sugar. Canned fruit in light or heavy syrup. [employer], Fish, and Poultry (2 servings or less daily. One serving is 3 to 4 oz [85-114 g]).  Eat More Often: Ninety percent or leaner ground beef, tenderloin, sirloin. Round cuts of beef, chicken  breast, [place] breast. All fish. Grill, bake, or broil your meat. Nothing should be fried.  Eat Less Often/Avoid: Fatty cuts of meat, [place], or chicken leg, thigh, or wing. Fried cuts of meat or fish. Dairy (2 to 3 servings)  Eat More Often: Low-fat or fat-free milk, low-fat plain or light yogurt, reduced-fat or part-skim cheese.  Eat Less Often/Avoid: Milk (whole, 2%).Whole milk yogurt. Full-fat cheeses. Nuts, Seeds, and Legumes (4 to 5 servings per week)  Eat More Often: All without added salt.  Eat Less Often/Avoid: Salted nuts and seeds, canned beans with added salt. Fats and Sweets (limited)  Eat More Often: Vegetable oils, tub margarines without trans fats, sugar-free gelatin. Mayonnaise and salad dressings.  Eat Less Often/Avoid: Coconut oils, palm oils, butter, stick margarine, cream, half and half, cookies, candy, pie. FOR MORE INFORMATION The Dash Diet Eating Plan: www.dashdiet.org Document Released: 11/24/2011 Document Revised: 02/27/2012 Document Reviewed: 11/24/2011 [facility] Patient Information 2014 [place], [place].  Hypertension As your heart beats, it forces blood through your arteries. This force is your blood pressure. If the pressure is too high, it is called hypertension (HTN) or high blood pressure. HTN is dangerous because you may have it and not know it. High blood pressure may mean that your heart has to work harder to pump blood. Your arteries may be narrow or stiff. The extra work puts you at risk for heart disease, stroke, and other problems.  Blood pressure consists of two numbers, a higher number over a lower, 110/72, for example. It is stated as "110 over 72." The ideal is below 120 for the top number (systolic) and under 80 for the bottom (diastolic). Write down your blood pressure today. You should pay close attention to your blood pressure if you have certain conditions such as:  Heart failure.  Prior heart attack.  Diabetes  Chronic kidney  disease.  Prior stroke.  Multiple risk factors for heart disease. To see if you have HTN, your blood pressure should be measured while you are seated with your arm held at the level of the heart. It should be measured at least twice. A one-time elevated blood pressure reading (especially in the Emergency Department) does not mean that you need treatment. There may be conditions in which the blood pressure is different between your right and left arms. It is important to see your caregiver soon for a recheck. Most people have essential hypertension which means that there is not a specific cause. This type of high blood pressure may be lowered by changing lifestyle factors such as:  Stress.  Smoking.  Lack of exercise.  Excessive weight.  Drug/tobacco/alcohol use.  Eating less salt. Most people do not have symptoms from high blood pressure until it has caused damage to the body. Effective treatment can often prevent, delay or reduce that damage. TREATMENT  When a cause has been identified, treatment for high blood pressure is directed  at the cause. There are a large number of medications to treat HTN. These fall into several categories, and your caregiver will help you select the medicines that are best for you. Medications may have side effects. You should review side effects with your caregiver. If your blood pressure stays high after you have made lifestyle changes or started on medicines,   Your medication(s) may need to be changed.  Other problems may need to be addressed.  Be certain you understand your prescriptions, and know how and when to take your medicine.  Be sure to follow up with your caregiver within the time frame advised (usually within two weeks) to have your blood pressure rechecked and to review your medications.  If you are taking more than one medicine to lower your blood pressure, make sure you know how and at what times they should be taken. Taking two medicines  at the same time can result in blood pressure that is too low. SEEK IMMEDIATE MEDICAL CARE IF:  You develop a severe headache, blurred or changing vision, or confusion.  You have unusual weakness or numbness, or a faint feeling.  You have severe chest or abdominal pain, vomiting, or breathing problems. MAKE SURE YOU:   Understand these instructions.  Will watch your condition.  Will get help right away if you are not doing well or get worse. Document Released: 12/05/2005 Document Revised: 02/27/2012 Document Reviewed: 07/25/2008 [facility] Patient Information 2014 [place], [place].  Type 2 Diabetes Mellitus, Adult Type 2 diabetes mellitus is a long-term (chronic) disease. In type 2 diabetes:  The pancreas does not make enough of a hormone called insulin.  The cells in the body do not respond as well to the insulin that is made.  Both of the above can happen. Normally, insulin moves sugars from food into tissue cells. This gives you energy. If you have type 2 diabetes, sugars cannot be moved into tissue cells. This causes high blood sugar (hyperglycemia).  HOME CARE  Have your hemoglobin A1c level checked twice a year. The level shows if your diabetes is under control or out of control.  Perform daily blood sugar testing as told by your doctor.  Check your keytone levels by testing your pee (urine) when you are sick and as told.  Take your diabetes or insulin medicine as told by your doctor.  Never run out of insulin.  Adjust how much insulin you give yourself based on how many carbs (carbohydrates) you eat. Carbs are in many foods, such as fruits, vegetables, whole grains, and dairy products.  Have a healthy snack between every healthy meal. Have 3 meals and 3 snacks a day.  Lose weight if you are overweight.  Carry a medical alert card or wear your medical alert jewelry.  Carry a 15 gram carb snack with you at all times. Examples include:  Glucose pills, 3 or  4.  Glucose gel, 15 gram tube.  Raisins, 2 tablespoons (24 grams).  Jelly beans, 6.  Animal crackers, 8.  Sugar pop, 4 ounces (120 milliliters).  Gummy treats, 9.  Notice low blood sugar (hypoglycemia) symptoms, such as:  Shaking (tremors).  Decreased ability to think clearly.  Sweating.  Increased heart rate.  Headache.  Dry mouth.  Hunger.  Crabbiness (irritability).  Being worried or tense (anxiety).  Restless sleep.  A change in speech or coordination.  Confusion.  Treat low blood sugar right away. If you are alert and can swallow, follow the 15:15 rule:  Take 15  20 grams of a rapid-acting glucose or carb. This includes glucose gel, glucose pills, or 4 ounces (120 milliters) of fruit juice, regular pop, or low-fat milk.  Check your blood sugar level after taking the glucose.  Take 15 20 grams of more glucose if the repeat blood sugar level is still 70 mg/dL (milligrams/deciliter) or below.  Eat a meal or snack within 1 hour of the blood sugar levels going back to normal.  Notice early symptoms of high blood sugar, such as:  Being really thirsty or drinking a lot (polydipsia).  Peeing (urinating) a lot (polyuria).  Do at least 150 minutes of physical activitity a week or as told.  Split the 150 minutes of activity up during the week. Do not do 150 minutes of activity in one day.  Perform exercises, such as weight lifting, at least 2 times a week or as told.  Adjust your insulin or food intake as needed if you start a new exercise or sport.  Follow your sick day plan when you are not able to eat or drink as usual.  Avoid tobacco use.  Women who are not pregnant should drink no more than 1 drink a day. Men should drink no more than 2 drinks a day.  Only drink alcohol with food.  Ask your doctor if alcohol is safe for you.  Tell your doctor if you drink alcohol several times during the week.  See your doctor regularly.  Schedule an eye  exam soon after you are diagnosed with diabetes. Schedule exams once every year.  Check your skin and feet every day. Check for cuts, bruises, redness, nail problems, bleeding, blisters, or sores. A doctor should do a foot exam once a year.  Brush your teeth and gums twice a day. Floss once a day. Visit your dentist regularly.  Share your diabetes plan with your workplace or school.  Stay up-to-date with shots that fight against diseases (immunizations).  Learn how to manage stress.  Get diabetes education and support as needed.  Ask your doctor for special help if:  You need help to maintain or improve how you to do things on your own.  You need help to maintain or improve the quality of your life.  You have foot or hand problems.  You have trouble cleaning yourself, dressing, eating, or doing physical activity. GET HELP RIGHT AWAY IF:  You have trouble breathing.  You have moderate to large ketone levels.  You are unable to eat food or drink fluids for more than 6 hours.  You feel sick to your stomach (nauseous) or throw up (vomit) for more than 6 hours.  Your blood sugar level is over 240 mg/dL.  There is a change in mental status.  You get another serious illness.  You have watery poop (diarrhea) for more than 6 hours.  You have been sick or have had a fever for 2 or more days and are not getting better.  You have pain when you are physically active. MAKE SURE YOU:  Understand these instructions.  Will watch your condition.  Will get help right away if you are not doing well or get worse. Document Released: 09/13/2008 Document Revised: 08/29/2012 Document Reviewed: 04/05/2013 [facility] Patient Information 2014 [place], [place].  Diabetes and Exercise Exercising regularly is important. It is not just about losing weight. It has many health benefits, such as:  Improving your overall fitness, flexibility, and endurance.  Increasing your bone  density.  Helping with weight control.  Decreasing your body fat.  Increasing your muscle strength.  Reducing stress and tension.  Improving your overall health. People with diabetes who exercise gain additional benefits because exercise:  Reduces appetite.  Improves the body's use of blood sugar (glucose).  Helps lower or control blood glucose.  Decreases blood pressure.  Helps control blood lipids (such as cholesterol and triglycerides).  Improves the body's use of the hormone insulin by:  Increasing the body's insulin sensitivity.  Reducing the body's insulin needs.  Decreases the risk for heart disease because exercising:  Lowers cholesterol and triglycerides levels.  Increases the levels of good cholesterol (such as high-density lipoproteins [HDL]) in the body.  Lowers blood glucose levels. YOUR ACTIVITY PLAN  Choose an activity that you enjoy and set realistic goals. Your health care provider or diabetes educator can help you make an activity plan that works for you. You can break activities into 2 or 3 sessions throughout the day. Doing so is as good as one long session. Exercise ideas include:  Taking the dog for a walk.  Taking the stairs instead of the elevator.  Dancing to your favorite song.  Doing your favorite exercise with a friend. RECOMMENDATIONS FOR EXERCISING WITH TYPE 1 OR TYPE 2 DIABETES   Check your blood glucose before exercising. If blood glucose levels are greater than 240 mg/dL, check for urine ketones. Do not exercise if ketones are present.  Avoid injecting insulin into areas of the body that are going to be exercised. For example, avoid injecting insulin into:  The arms when playing tennis.  The legs when jogging.  Keep a record of:  Food intake before and after you exercise.  Expected peak times of insulin action.  Blood glucose levels before and after you exercise.  The type and amount of exercise you have done.  Review  your records with your health care provider. Your health care provider will help you to develop guidelines for adjusting food intake and insulin amounts before and after exercising.  If you take insulin or oral hypoglycemic agents, watch for signs and symptoms of hypoglycemia. They include:  Dizziness.  Shaking.  Sweating.  Chills.  Confusion.  Drink plenty of water while you exercise to prevent dehydration or heat stroke. Body water is lost during exercise and must be replaced.  Talk to your health care provider before starting an exercise program to make sure it is safe for you. Remember, almost any type of activity is better than none. Document Released: 02/25/2004 Document Revised: 08/07/2013 Document Reviewed: 05/14/2013 [facility] Patient Information 2014 [place], [place].   H1N1 Influenza (swine flu) Vaccine injection What is this medicine? H1N1 INFLUENZA (SWINE FLU) VACCINE (H1N1 in floo EN zuh (swahyn floo) vak SEEN) is a vaccine to protect from an infection with the pandemic H1N1 flu, also known as the swine flu. The vaccine only helps protect you against this one strain of the flu. This vaccine does not help to the reduce the risk of getting other types of flu. You may also need to get the seasonal influenza virus vaccine. This medicine may be used for other purposes; ask your health care provider or pharmacist if you have questions. COMMON BRAND NAME(S): Influenza A (H1N1) 2009 Monovalent Vaccine What should I tell my health care provider before I take this medicine? They need to know if you have any of these conditions: -Guillain-Barre syndrome -immune system problems -an unusual or allergic reaction to influenza vaccine, eggs, neomycin, polymyxin, other medicines, foods, dyes or preservatives -  pregnant or trying to get pregnant -breast-feeding How should I use this medicine? This vaccine is for injection into a muscle. It is given by a health care professional. A copy of  Vaccine Information Statements will be given before each vaccination. Read this sheet carefully each time. The sheet may change frequently. Talk to your pediatrician regarding the use of this medicine in children. Special care may be needed. While this drug may be prescribed for children as young as 6 months for selected conditions, precautions do apply. Overdosage: If you think you've taken too much of this medicine contact a poison control center or emergency room at once. Overdosage: If you think you have taken too much of this medicine contact a poison control center or emergency room at once. NOTE: This medicine is only for you. Do not share this medicine with others. What if I miss a dose? If needed, keep appointments for follow-up (booster) doses as directed. It is important not to miss your dose. Call your doctor or health care professional if you are unable to keep an appointment. What may interact with this medicine? -anakinra -medicines for organ transplant -medicines to treat cancer -other vaccines -rilonacept -steroid medicines like prednisone or cortisone -tumor necrosis factor (TNF) modifiers like adalimumab, etanercept, infliximab, golimumab, or certolizumab This list may not describe all possible interactions. Give your health care provider a list of all the medicines, herbs, non-prescription drugs, or dietary supplements you use. Also tell them if you smoke, drink alcohol, or use illegal drugs. Some items may interact with your medicine. What should I watch for while using this medicine? Report any side effects to your doctor right away. This vaccine lowers your risk of getting the pandemic H1N1 flu. You can get a milder H1N1 flu infection if you are around others with this flu. This flu vaccine will not protect against colds or other illnesses including other flu viruses. You may also need the seasonal influenza vaccine. What side effects may I notice from receiving this  medicine? Side effects that you should report to your doctor or health care professional as soon as possible: -allergic reactions like skin rash, itching or hives, swelling of the face, lips, or tongue -breathing problems -muscle weakness -unusual drooping or paralysis of face Side effects that usually do not require medical attention (Report these to your doctor or health care professional if they continue or are bothersome.): -chills -cough -headache -muscle aches and pains -runny or stuffy nose -sore throat -stomach upset -tiredness This list may not describe all possible side effects. Call your doctor for medical advice about side effects. You may report side effects to FDA at 1-800-FDA-1088. Where should I keep my medicine? This vaccine is only given in a clinic, pharmacy, doctor's office, or other health care setting and will not be stored at home. NOTE: This sheet is a summary. It may not cover all possible information. If you have questions about this medicine, talk to your doctor, pharmacist, or health care provider.  2014, Elsevier/Gold Standard. (2008-11-04 16:49:51)  Tension Headache A tension headache is a feeling of pain, pressure, or aching often felt over the front and sides of the head. The pain can be dull or can feel tight (constricting). It is the most common type of headache. Tension headaches are not normally associated with nausea or vomiting and do not get worse with physical activity. Tension headaches can last 30 minutes to several days.  CAUSES  The exact cause is not known, but it may  be caused by chemicals and hormones in the brain that lead to pain. Tension headaches often begin after stress, anxiety, or depression. Other triggers may include:  Alcohol.  Caffeine (too much or withdrawal).  Respiratory infections (colds, flu, sinus infections).  Dental problems or teeth clenching.  Fatigue.  Holding your head and neck in one position too long while  using a computer. SYMPTOMS   Pressure around the head.   Dull, aching head pain.   Pain felt over the front and sides of the head.   Tenderness in the muscles of the head, neck, and shoulders. DIAGNOSIS  A tension headache is often diagnosed based on:   Symptoms.   Physical examination.   A CT scan or MRI of your head. These tests may be ordered if symptoms are severe or unusual. TREATMENT  Medicines may be given to help relieve symptoms.  HOME CARE INSTRUCTIONS   Only take over-the-counter or prescription medicines for pain or discomfort as directed by your caregiver.   Lie down in a dark, quiet room when you have a headache.   Keep a journal to find out what may be triggering your headaches. For example, write down:  What you eat and drink.  How much sleep you get.  Any change to your diet or medicines.  Try massage or other relaxation techniques.   Ice packs or heat applied to the head and neck can be used. Use these 3 to 4 times per day for 15 to 20 minutes each time, or as needed.   Limit stress.   Sit up straight, and do not tense your muscles.   Quit smoking if you smoke.  Limit alcohol use.  Decrease the amount of caffeine you drink, or stop drinking caffeine.  Eat and exercise regularly.  Get 7 to 9 hours of sleep, or as recommended by your caregiver.  Avoid excessive use of pain medicine as recurrent headaches can occur.  SEEK MEDICAL CARE IF:   You have problems with the medicines you were prescribed.  Your medicines do not work.  You have a change from the usual headache.  You have nausea or vomiting. SEEK IMMEDIATE MEDICAL CARE IF:   Your headache becomes severe.  You have a fever.  You have a stiff neck.  You have loss of vision.  You have muscular weakness or loss of muscle control.  You lose your balance or have trouble walking.  You feel faint or pass out.  You have severe symptoms that are different from your  first symptoms. MAKE SURE YOU:   Understand these instructions.  Will watch your condition.  Will get help right away if you are not doing well or get worse. Document Released: 12/05/2005 Document Revised: 02/27/2012 Document Reviewed: 11/25/2011 [facility] Patient Information 2014 [place], [place]. Osteoarthritis Osteoarthritis is the most common form of arthritis. It is redness, soreness, and swelling (inflammation) affecting the cartilage. Cartilage acts as a cushion, covering the ends of bones where they meet to form a joint. CAUSES  Over time, the cartilage begins to wear away. This causes bone to rub on bone. This produces pain and stiffness in the affected joints. Factors that contribute to this problem are:  Excessive body weight.  Age.  Overuse of joints. SYMPTOMS   People with osteoarthritis usually experience joint pain, swelling, or stiffness.  Over time, the joint may lose its normal shape.  Small deposits of bone (osteophytes) may grow on the edges of the joint.  Bits of bone or  cartilage can break off and float inside the joint space. This may cause more pain and damage.  Osteoarthritis can lead to depression, anxiety, feelings of helplessness, and limitations on daily activities. The most commonly affected joints are in the:  Ends of the fingers.  Thumbs.  Neck.  Lower back.  Knees.  Hips. DIAGNOSIS  Diagnosis is mostly based on your symptoms and exam. Tests may be helpful, including:  X-rays of the affected joint.  A computerized magnetic scan (MRI).  Blood tests to rule out other types of arthritis.  Joint fluid tests. This involves using a needle to draw fluid from the joint and examining the fluid under a microscope. TREATMENT  Goals of treatment are to control pain, improve joint function, maintain a normal body weight, and maintain a healthy lifestyle. Treatment approaches may include:  A prescribed exercise program with rest and joint  relief.  Weight control with nutritional education.  Pain relief techniques such as:  Properly applied heat and cold.  Electric pulses delivered to nerve endings under the skin (transcutaneous electrical nerve stimulation, TENS).  Massage.  Certain supplements. Ask your caregiver before using any supplements, especially in combination with prescribed drugs.  Medicines to control pain, such as:  Acetaminophen.  Nonsteroidal anti-inflammatory drugs (NSAIDs), such as naproxen.  Narcotic or central-acting agents, such as tramadol. This drug carries a risk of addiction and is generally prescribed for short-term use.  Corticosteroids. These can be given orally or as injection. This is a short-term treatment, not recommended for routine use.  Surgery to reposition the bones and relieve pain (osteotomy) or to remove loose pieces of bone and cartilage. Joint replacement may be needed in advanced states of osteoarthritis. HOME CARE INSTRUCTIONS  Your caregiver can recommend specific types of exercise. These may include:  Strengthening exercises. These are done to strengthen the muscles that support joints affected by arthritis. They can be performed with weights or with exercise bands to add resistance.  Aerobic activities. These are exercises, such as brisk walking or low-impact aerobics, that get your heart pumping. They can help keep your lungs and circulatory system in shape.  Range-of-motion activities. These keep your joints limber.  Balance and agility exercises. These help you maintain daily living skills. Learning about your condition and being actively involved in your care will help improve the course of your osteoarthritis. SEEK MEDICAL CARE IF:   You feel hot or your skin turns red.  You develop a rash in addition to your joint pain.  You have an oral temperature above 102 F (38.9 C). FOR MORE INFORMATION  National Institute of Arthritis and Musculoskeletal and Skin  Diseases: www.niams.http://www.myers.net/ [employer] on Aging: https://walker.com/ American College of Rheumatology: www.rheumatology.org Document Released: 12/05/2005 Document Revised: 02/27/2012 Document Reviewed: 03/18/2010 [facility] Patient Information 2014 [place], [place].  Arthritis, Nonspecific Arthritis is inflammation of a joint. This usually means pain, redness, warmth or swelling are present. One or more joints may be involved. There are a number of types of arthritis. Your caregiver may not be able to tell what type of arthritis you have right away. CAUSES  The most common cause of arthritis is the wear and tear on the joint (osteoarthritis). This causes damage to the cartilage, which can break down over time. The knees, hips, back and neck are most often affected by this type of arthritis. Other types of arthritis and common causes of joint pain include:  Sprains and other injuries near the joint. Sometimes minor sprains and injuries cause pain  and swelling that develop hours later.  Rheumatoid arthritis. This affects hands, feet and knees. It usually affects both sides of your body at the same time. It is often associated with chronic ailments, fever, weight loss and general weakness.  Crystal arthritis. Gout and pseudo gout can cause occasional acute severe pain, redness and swelling in the foot, ankle, or knee.  Infectious arthritis. Bacteria can get into a joint through a break in overlying skin. This can cause infection of the joint. Bacteria and viruses can also spread through the blood and affect your joints.  Drug, infectious and allergy reactions. Sometimes joints can become mildly painful and slightly swollen with these types of illnesses. SYMPTOMS   Pain is the main symptom.  Your joint or joints can also be red, swollen and warm or hot to the touch.  You may have a fever with certain types of arthritis, or even feel overall ill.  The joint with arthritis will hurt with  movement. Stiffness is present with some types of arthritis. DIAGNOSIS  Your caregiver will suspect arthritis based on your description of your symptoms and on your exam. Testing may be needed to find the type of arthritis:  Blood and sometimes urine tests.  X-ray tests and sometimes CT or MRI scans.  Removal of fluid from the joint (arthrocentesis) is done to check for bacteria, crystals or other causes. Your caregiver (or a specialist) will numb the area over the joint with a local anesthetic, and use a needle to remove joint fluid for examination. This procedure is only minimally uncomfortable.  Even with these tests, your caregiver may not be able to tell what kind of arthritis you have. Consultation with a specialist (rheumatologist) may be helpful. TREATMENT  Your caregiver will discuss with you treatment specific to your type of arthritis. If the specific type cannot be determined, then the following general recommendations may apply. Treatment of severe joint pain includes:  Rest.  Elevation.  Anti-inflammatory medication (for example, ibuprofen) may be prescribed. Avoiding activities that cause increased pain.  Only take over-the-counter or prescription medicines for pain and discomfort as recommended by your caregiver.  Cold packs over an inflamed joint may be used for 10 to 15 minutes every hour. Hot packs sometimes feel better, but do not use overnight. Do not use hot packs if you are diabetic without your caregiver's permission.  A cortisone shot into arthritic joints may help reduce pain and swelling.  Any acute arthritis that gets worse over the next 1 to 2 days needs to be looked at to be sure there is no joint infection. Long-term arthritis treatment involves modifying activities and lifestyle to reduce joint stress jarring. This can include weight loss. Also, exercise is needed to nourish the joint cartilage and remove waste. This helps keep the muscles around the joint  strong. HOME CARE INSTRUCTIONS   Do not take aspirin to relieve pain if gout is suspected. This elevates uric acid levels.  Only take over-the-counter or prescription medicines for pain, discomfort or fever as directed by your caregiver.  Rest the joint as much as possible.  If your joint is swollen, keep it elevated.  Use crutches if the painful joint is in your leg.  Drinking plenty of fluids may help for certain types of arthritis.  Follow your caregiver's dietary instructions.  Try low-impact exercise such as:  Swimming.  Water aerobics.  Biking.  Walking.  Morning stiffness is often relieved by a warm shower.  Put your joints through  regular range-of-motion. SEEK MEDICAL CARE IF:   You do not feel better in 24 hours or are getting worse.  You have side effects to medications, or are not getting better with treatment. SEEK IMMEDIATE MEDICAL CARE IF:   You have a fever.  You develop severe joint pain, swelling or redness.  Many joints are involved and become painful and swollen.  There is severe back pain and/or leg weakness.  You have loss of bowel or bladder control. Document Released: 01/12/2005 Document Revised: 02/27/2012 Document Reviewed: 01/28/2009 [facility] Patient Information 2014 [place], [place].

## 2013-11-30 LAB — URINALYSIS, ROUTINE W REFLEX MICROSCOPIC
Bilirubin Urine: NEGATIVE
Ketones, ur: NEGATIVE mg/dL
Nitrite: NEGATIVE
Protein, ur: NEGATIVE mg/dL
Specific Gravity, Urine: 1.019 (ref 1.005–1.030)
Urobilinogen, UA: 0.2 mg/dL (ref 0.0–1.0)

## 2013-11-30 LAB — URINALYSIS, MICROSCOPIC ONLY: Casts: NONE SEEN

## 2013-11-30 LAB — MICROALBUMIN / CREATININE URINE RATIO
Creatinine, Urine: 198.2 mg/dL
Microalb Creat Ratio: 3.2 mg/g (ref 0.0–30.0)
Microalb, Ur: 0.64 mg/dL (ref 0.00–1.89)

## 2013-12-03 ENCOUNTER — Telehealth: Payer: Self-pay | Admitting: Licensed Clinical Social Worker
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
## 2013-12-06 ENCOUNTER — Other Ambulatory Visit: Payer: Self-pay | Admitting: Internal Medicine

## 2013-12-06 DIAGNOSIS — E785 Hyperlipidemia, unspecified: Secondary | ICD-10-CM

## 2013-12-06 DIAGNOSIS — R300 Dysuria: Secondary | ICD-10-CM

## 2013-12-06 MED ORDER — LEVOFLOXACIN 250 MG PO TABS: 250.0000 mg | ORAL_TABLET | Freq: Every day | ORAL | Status: DC

## 2013-12-06 MED ORDER — SIMVASTATIN 40 MG PO TABS: 40.0000 mg | ORAL_TABLET | Freq: Every day | ORAL | Status: DC

## 2013-12-06 NOTE — Assessment & Plan Note (Signed)
Lipid Panel     Component Value Date/Time   CHOL 256* 11/29/2013 1430   TRIG 107 11/29/2013 1430   HDL 60 11/29/2013 1430   CHOLHDL 4.3 11/29/2013 1430   VLDL 21 11/29/2013 1430   LDLCALC 175* 11/29/2013 1430   LDL elevated pt was previously on Zocor 40 qhs Will Rx refill Zocor x 1 year supply  Repeat lipid panel in 1 year

## 2013-12-06 NOTE — Assessment & Plan Note (Signed)
Pt still with dysuria Cx grew out 10K Enterococcus sensitive to Levaquin will tx with Levaquin 250 mg x 3 days for uncomplicated UTI

## 2013-12-20 ENCOUNTER — Telehealth: Payer: Self-pay | Admitting: Licensed Clinical Social Worker
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
## 2014-01-10 ENCOUNTER — Ambulatory Visit (INDEPENDENT_AMBULATORY_CARE_PROVIDER_SITE_OTHER): Payer: Medicare HMO | Admitting: Internal Medicine

## 2014-01-10 ENCOUNTER — Encounter: Payer: Self-pay | Admitting: Internal Medicine

## 2014-01-10 VITALS — BP 116/77 | HR 77 | Temp 98.2°F | Wt 128.7 lb

## 2014-01-10 DIAGNOSIS — E785 Hyperlipidemia, unspecified: Secondary | ICD-10-CM

## 2014-01-10 DIAGNOSIS — K644 Residual hemorrhoidal skin tags: Secondary | ICD-10-CM | POA: Insufficient documentation

## 2014-01-10 DIAGNOSIS — M1990 Unspecified osteoarthritis, unspecified site: Secondary | ICD-10-CM

## 2014-01-10 DIAGNOSIS — K449 Diaphragmatic hernia without obstruction or gangrene: Secondary | ICD-10-CM

## 2014-01-10 DIAGNOSIS — J302 Other seasonal allergic rhinitis: Secondary | ICD-10-CM

## 2014-01-10 DIAGNOSIS — K219 Gastro-esophageal reflux disease without esophagitis: Secondary | ICD-10-CM

## 2014-01-10 DIAGNOSIS — F3289 Other specified depressive episodes: Secondary | ICD-10-CM

## 2014-01-10 DIAGNOSIS — M899 Disorder of bone, unspecified: Secondary | ICD-10-CM

## 2014-01-10 DIAGNOSIS — F329 Major depressive disorder, single episode, unspecified: Secondary | ICD-10-CM

## 2014-01-10 DIAGNOSIS — J309 Allergic rhinitis, unspecified: Secondary | ICD-10-CM

## 2014-01-10 DIAGNOSIS — I872 Venous insufficiency (chronic) (peripheral): Secondary | ICD-10-CM

## 2014-01-10 DIAGNOSIS — G44229 Chronic tension-type headache, not intractable: Secondary | ICD-10-CM

## 2014-01-10 DIAGNOSIS — Z0000 Encounter for general adult medical examination without abnormal findings: Secondary | ICD-10-CM

## 2014-01-10 DIAGNOSIS — E559 Vitamin D deficiency, unspecified: Secondary | ICD-10-CM

## 2014-01-10 DIAGNOSIS — E119 Type 2 diabetes mellitus without complications: Secondary | ICD-10-CM

## 2014-01-10 DIAGNOSIS — IMO0002 Reserved for concepts with insufficient information to code with codable children: Secondary | ICD-10-CM

## 2014-01-10 DIAGNOSIS — R7611 Nonspecific reaction to tuberculin skin test without active tuberculosis: Secondary | ICD-10-CM

## 2014-01-10 DIAGNOSIS — M949 Disorder of cartilage, unspecified: Secondary | ICD-10-CM

## 2014-01-10 DIAGNOSIS — K5790 Diverticulosis of intestine, part unspecified, without perforation or abscess without bleeding: Secondary | ICD-10-CM | POA: Insufficient documentation

## 2014-01-10 DIAGNOSIS — F325 Major depressive disorder, single episode, in full remission: Secondary | ICD-10-CM | POA: Insufficient documentation

## 2014-01-10 DIAGNOSIS — D509 Iron deficiency anemia, unspecified: Secondary | ICD-10-CM

## 2014-01-10 DIAGNOSIS — M75100 Unspecified rotator cuff tear or rupture of unspecified shoulder, not specified as traumatic: Secondary | ICD-10-CM

## 2014-01-10 DIAGNOSIS — M7550 Bursitis of unspecified shoulder: Secondary | ICD-10-CM | POA: Insufficient documentation

## 2014-01-10 DIAGNOSIS — K648 Other hemorrhoids: Secondary | ICD-10-CM

## 2014-01-10 DIAGNOSIS — D1330 Benign neoplasm of unspecified part of small intestine: Secondary | ICD-10-CM

## 2014-01-10 DIAGNOSIS — M8580 Other specified disorders of bone density and structure, unspecified site: Secondary | ICD-10-CM

## 2014-01-10 DIAGNOSIS — F32A Depression, unspecified: Secondary | ICD-10-CM

## 2014-01-10 DIAGNOSIS — E663 Overweight: Secondary | ICD-10-CM

## 2014-01-10 DIAGNOSIS — J45909 Unspecified asthma, uncomplicated: Secondary | ICD-10-CM

## 2014-01-10 DIAGNOSIS — K5909 Other constipation: Secondary | ICD-10-CM

## 2014-01-10 DIAGNOSIS — K5900 Constipation, unspecified: Secondary | ICD-10-CM

## 2014-01-10 HISTORY — DX: Gastro-esophageal reflux disease without esophagitis: K21.9

## 2014-01-10 HISTORY — DX: Vitamin D deficiency, unspecified: E55.9

## 2014-01-10 HISTORY — DX: Other specified disorders of bone density and structure, unspecified site: M85.80

## 2014-01-10 HISTORY — DX: Diverticulosis of intestine, part unspecified, without perforation or abscess without bleeding: K57.90

## 2014-01-10 HISTORY — DX: Other constipation: K59.09

## 2014-01-10 HISTORY — DX: Depression, unspecified: F32.A

## 2014-01-10 HISTORY — DX: Nonspecific reaction to tuberculin skin test without active tuberculosis: R76.11

## 2014-01-10 HISTORY — DX: Residual hemorrhoidal skin tags: K64.4

## 2014-01-10 HISTORY — DX: Other seasonal allergic rhinitis: J30.2

## 2014-01-10 HISTORY — DX: Benign neoplasm of unspecified part of small intestine: D13.30

## 2014-01-10 HISTORY — DX: Overweight: E66.3

## 2014-01-10 HISTORY — DX: Venous insufficiency (chronic) (peripheral): I87.2

## 2014-01-10 MED ORDER — CALCIUM CARBONATE-VITAMIN D 500-200 MG-UNIT PO TABS: 2.0000 | ORAL_TABLET | Freq: Two times a day (BID) | ORAL | Status: DC

## 2014-01-10 NOTE — Assessment & Plan Note (Signed)
Her diabetes is well controlled with diet alone. Her last hemoglobin A1c within the last month was 6.2. Her blood pressure is at target. Her last cholesterol check revealed an LDL of 175. This is above target and was related to not taking her statin therapy. She now states she is taking the simvastatin. She's up-to-date on all of the other diabetic preventative measures including her diabetic foot exam which was done last month and her diabetic retinal exam which was done in late September. She was encouraged to continue her current diet and lifestyle. We will check a hemoglobin A1c at the followup visit.

## 2014-01-10 NOTE — Assessment & Plan Note (Addendum)
The last DEXA scan in April 2013 showed osteopenia with a right femoral neck T score of -2.1. Her FRAX score demonstrated a 10 year risk for a hip fracture at 1.7% and a major osteoporotic fracture at 9%. She therefore did not qualify for bisphosphonate therapy at that time. She will be due for a repeat DEXA scan in April 2015. At the followup visit we will arrange for this study. She states she has not been taking calcium or vitamin D. I encouraged her to do so and prescribed this therapy given her osteopenia.

## 2014-01-10 NOTE — Assessment & Plan Note (Signed)
Last month her LDL was 175 which is well above the target of less than 100 given her diabetes. At that time she was not taking her simvastatin. Today, I asked her if she was taking her simvastatin and she said she was. She is tolerating this medication without myalgias. We will therefore continue the simvastatin at 40 mg by mouth each night and recheck the lipid panel at the followup visit in 3 months to assure she is at target.

## 2014-01-10 NOTE — Assessment & Plan Note (Signed)
She is up-to-date on her tetanus-diphtheria immunization and has completed her Pneumovax series. She is thinking about the Zostavax and is concerned about the cost. We will reassess her interest at the followup visit. We obtained her records from [facility] which document her last colonoscopy being in 2012 without polyps. Given her age and lack of previous polyps no further colonoscopies are recommended. Her last mammogram was in November 2014 and this will be scanned into her medical record. As noted above, her last DEXA scan was in April 2013 and she is due for a repeat DEXA scan in April 2015. This will be ordered at the followup visit.

## 2014-01-10 NOTE — Assessment & Plan Note (Signed)
She carries a diagnosis of vitamin D deficiency. She states she was prescribed vitamin D in the past but has not been taking it. I encouraged her to take calcium and vitamin D for osteopenia and this medication was prescribed. We will recheck a vitamin D level at the followup visit to see if she needs more aggressive supplementation.

## 2014-01-10 NOTE — Assessment & Plan Note (Signed)
She has seasonal allergic rhinitis that is worse in the winter. As she is currently having symptoms, she is requesting therapy. She was advised to obtain over-the-counter loratadine 10 mg by mouth daily as needed for her symptomatic allergic rhinitis. We will reassess the success of this therapy at the followup visit.

## 2014-01-10 NOTE — Patient Instructions (Signed)
It was great to meet you.  I look forward to caring for you for years to come.  1) Keep taking all of the medications that you told me you were taking.  2) I officially stopped all of the medications that you told me you were no longer taking.  3) I injected your right shoulder today to see if we could improve the pain.  Please let us know if you develop redness or worsening pain in the shoulder.  4) Keep taking the tramadol for your arthritis of the knee and hand.  5) For your allergies try loratadine 10 mg by mouth daily as needed for allergy symptoms.  I will see you back in 3 months to check on your diabetes and right shoulder pain.  I can see you sooner if needed.

## 2014-01-10 NOTE — Assessment & Plan Note (Signed)
A major complaint today was chronic right knee pain and right hand pain with stiffness. She has a history of osteoarthritis of the hand and what she feels is cartilage damage of the right knee. Of note, she is status post a right knee arthroscopy in 1995. For both of these conditions she has taken tramadol 50 mg as needed. This provides her with some relief. I offered her the option to start a nonsteroidal anti-inflammatory medication, but she was not interested, wanting to continue the tramadol. With regards to the knee, she will have a sensation that there may be give way on occasion, but she has never fallen. We will reassess her symptoms at the followup visit.

## 2014-01-10 NOTE — Assessment & Plan Note (Signed)
She has chronic constipation and requires as needed Colace. We will therefore continue with this therapy.

## 2014-01-10 NOTE — Assessment & Plan Note (Signed)
On occasion she will feel depressed. This is intermittent and not severe per her report. She does not feel she requires any pharmacologic therapy at this time. We will continue to follow her symptomatically.

## 2014-01-10 NOTE — Assessment & Plan Note (Signed)
She has occasional symptomatic reflux disease. This is well controlled on omeprazole 20 mg by mouth daily. We will continue the PPI therapy for symptomatic relief.

## 2014-01-10 NOTE — Assessment & Plan Note (Signed)
She has mild bilateral lower extremity edema which improves overnight and worsens throughout the day. This is very suggestive of chronic venous insufficiency. In the past she used compression stockings but is not interested in this therapy at this time given the mild nature of her disease. We will continue to follow.

## 2014-01-10 NOTE — Assessment & Plan Note (Signed)
Symptomatically, her asthma is well controlled. She only requires as needed albuterol by MDI or nebulization. We will continue this as needed therapy.

## 2014-01-10 NOTE — Assessment & Plan Note (Signed)
Although she has a history of chronic tension headaches these have been improved slightly recently. Today she is completely without a headache. We will continue to follow her symptoms.
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
## 2014-01-10 NOTE — Assessment & Plan Note (Signed)
She notes about 6 months of right sided shoulder pain. It is painful to lay on the right shoulder at night. She also notes pain on examination with external rotation as well as an arc from 30-120 of abduction. Examination also reveals palpable tenderness laterally in the right shoulder. She had a negative drop arm test. Given these signs and symptoms I am concerned she may have a rotator cuff tendinitis or a subacromial bursitis. We discussed the options including continuing the as needed tramadol, starting an anti-inflammatory agent, or injecting the right shoulder to see if we can get immediate improvement of her symptoms. She decided upon a right shoulder injection.  She was explained the risks, benefits, and alternatives to a right shoulder injection. After informed consent she agreed to proceed with the injection. A time out was performed and the correct patient and correct side was documented. She was prepped in the usual sterile fashion. Freeze spray was used to numb the lateral injection site which was identified by palpation. A one and a half inch 27-gauge needle was inserted under the acromion process without difficulty. 40 mg of Kenalog and 1 cc of 1% lidocaine were injected into the space. She tolerated the procedure well. Within 5 minutes after the procedure she noted improvement in her right shoulder pain.  We will assess the duration of the pain control with the injection. It is hoped she will have long lasting control of her pain with this therapy. If so she may consider a repeat right shoulder injection in the future should her symptoms return.

## 2014-01-10 NOTE — Progress Notes (Signed)
   Subjective:    Patient ID: Colleen Medina, female    DOB: 02/07/1941, 73 y.o.   MRN: [id number]  HPI  Please see the A&P for the status of the pt's chronic medical problems.  Review of Systems  Constitutional: Negative for fever, chills, diaphoresis, activity change, appetite change and unexpected weight change.  HENT: Positive for congestion, postnasal drip, rhinorrhea and sneezing.   Eyes: Negative for discharge and redness.  Respiratory: Negative for shortness of breath and wheezing.   Cardiovascular: Positive for leg swelling. Negative for chest pain.  Gastrointestinal: Positive for constipation. Negative for nausea, vomiting, abdominal pain, diarrhea and abdominal distention.  Genitourinary: Positive for urgency. Negative for dysuria, decreased urine volume and difficulty urinating.  Musculoskeletal: Positive for arthralgias. Negative for back pain, gait problem, joint swelling, myalgias, neck pain and neck stiffness.  Skin: Negative for color change, rash and wound.  Allergic/Immunologic: Positive for environmental allergies. Negative for food allergies and immunocompromised state.  Neurological: Negative for dizziness, seizures, syncope, weakness and headaches.  Hematological: Negative for adenopathy.  Psychiatric/Behavioral: Positive for dysphoric mood. Negative for confusion, decreased concentration and agitation. The patient is not nervous/anxious.       Objective:   Physical Exam  Nursing note and vitals reviewed. Constitutional: She is oriented to person, place, and time. She appears well-developed and well-nourished. No distress.  HENT:  Head: Normocephalic and atraumatic.  Mouth/Throat: Oropharynx is clear and moist. No oropharyngeal exudate.  Black hue to tongue with is chronic per patient report.  Eyes: Conjunctivae and EOM are normal. Right eye exhibits no discharge. Left eye exhibits no discharge. No scleral icterus.  Neck: Normal range of motion. Neck supple.  No JVD present. No tracheal deviation present. No thyromegaly present.  Cardiovascular: Normal rate, regular rhythm and normal heart sounds.  Exam reveals no gallop and no friction rub.   No murmur heard. Pulmonary/Chest: Effort normal and breath sounds normal. No stridor. No respiratory distress. She has no wheezes. She has no rales.  Abdominal: Soft. Bowel sounds are normal. She exhibits no distension. There is no tenderness. There is no rebound and no guarding.  Musculoskeletal: Normal range of motion. She exhibits edema. She exhibits no tenderness.  Lymphadenopathy:    She has no cervical adenopathy.  Neurological: She is alert and oriented to person, place, and time. She has normal reflexes. She exhibits normal muscle tone.  Skin: Skin is warm and dry. No rash noted. She is not diaphoretic. No erythema.  Psychiatric: She has a normal mood and affect. Her behavior is normal. Judgment and thought content normal.      Assessment & Plan:   Please see problem oriented charting.

## 2014-01-10 NOTE — Assessment & Plan Note (Signed)
She has a chronic microcytic anemia. Extensive workup including iron panel and colonoscopy have been negative. She has a mother and sister, as well as her daughter's, who also have a chronic microcytic anemia. This is suggestive of a thalassemia that may be contributing to this chronic anemia. No further evaluation is necessary at this time.

## 2014-05-09 ENCOUNTER — Ambulatory Visit (INDEPENDENT_AMBULATORY_CARE_PROVIDER_SITE_OTHER): Payer: Commercial Managed Care - HMO | Admitting: Internal Medicine

## 2014-05-09 ENCOUNTER — Encounter: Payer: Self-pay | Admitting: Internal Medicine

## 2014-05-09 ENCOUNTER — Encounter: Payer: Self-pay | Admitting: Licensed Clinical Social Worker

## 2014-05-09 VITALS — BP 130/72 | HR 67 | Temp 97.2°F | Wt 125.9 lb

## 2014-05-09 DIAGNOSIS — M8580 Other specified disorders of bone density and structure, unspecified site: Secondary | ICD-10-CM

## 2014-05-09 DIAGNOSIS — K449 Diaphragmatic hernia without obstruction or gangrene: Secondary | ICD-10-CM

## 2014-05-09 DIAGNOSIS — M75100 Unspecified rotator cuff tear or rupture of unspecified shoulder, not specified as traumatic: Secondary | ICD-10-CM

## 2014-05-09 DIAGNOSIS — M899 Disorder of bone, unspecified: Secondary | ICD-10-CM

## 2014-05-09 DIAGNOSIS — J302 Other seasonal allergic rhinitis: Secondary | ICD-10-CM

## 2014-05-09 DIAGNOSIS — J45909 Unspecified asthma, uncomplicated: Secondary | ICD-10-CM

## 2014-05-09 DIAGNOSIS — M7550 Bursitis of unspecified shoulder: Secondary | ICD-10-CM

## 2014-05-09 DIAGNOSIS — M1990 Unspecified osteoarthritis, unspecified site: Secondary | ICD-10-CM

## 2014-05-09 DIAGNOSIS — E785 Hyperlipidemia, unspecified: Secondary | ICD-10-CM

## 2014-05-09 DIAGNOSIS — IMO0002 Reserved for concepts with insufficient information to code with codable children: Secondary | ICD-10-CM

## 2014-05-09 DIAGNOSIS — T7491XA Unspecified adult maltreatment, confirmed, initial encounter: Secondary | ICD-10-CM

## 2014-05-09 DIAGNOSIS — K5909 Other constipation: Secondary | ICD-10-CM

## 2014-05-09 DIAGNOSIS — K219 Gastro-esophageal reflux disease without esophagitis: Secondary | ICD-10-CM

## 2014-05-09 DIAGNOSIS — E119 Type 2 diabetes mellitus without complications: Secondary | ICD-10-CM

## 2014-05-09 DIAGNOSIS — E559 Vitamin D deficiency, unspecified: Secondary | ICD-10-CM

## 2014-05-09 DIAGNOSIS — K5900 Constipation, unspecified: Secondary | ICD-10-CM

## 2014-05-09 DIAGNOSIS — Z0000 Encounter for general adult medical examination without abnormal findings: Secondary | ICD-10-CM

## 2014-05-09 DIAGNOSIS — E663 Overweight: Secondary | ICD-10-CM

## 2014-05-09 DIAGNOSIS — M949 Disorder of cartilage, unspecified: Secondary | ICD-10-CM

## 2014-05-09 LAB — GLUCOSE, CAPILLARY: GLUCOSE-CAPILLARY: 111 mg/dL — AB (ref 70–99)

## 2014-05-09 LAB — LIPID PANEL
CHOL/HDL RATIO: 3.3 ratio
CHOLESTEROL: 180 mg/dL (ref 0–200)
HDL: 55 mg/dL (ref >39)
LDL Cholesterol: 113 mg/dL — ABNORMAL HIGH (ref 0–99)
TRIGLYCERIDES: 58 mg/dL (ref <150)
VLDL: 12 mg/dL (ref 0–40)

## 2014-05-09 LAB — POCT GLYCOSYLATED HEMOGLOBIN (HGB A1C): Hemoglobin A1C: 6.5

## 2014-05-09 MED ORDER — DICLOFENAC POTASSIUM 50 MG PO TABS: 50.0000 mg | ORAL_TABLET | Freq: Three times a day (TID) | ORAL | Status: DC | PRN

## 2014-05-09 MED ORDER — OMEPRAZOLE 20 MG PO CPDR: 20.0000 mg | DELAYED_RELEASE_CAPSULE | Freq: Every day | ORAL | Status: DC

## 2014-05-09 MED ORDER — ALBUTEROL SULFATE HFA 108 (90 BASE) MCG/ACT IN AERS: 2.0000 | INHALATION_SPRAY | Freq: Four times a day (QID) | RESPIRATORY_TRACT | Status: DC | PRN

## 2014-05-09 MED ORDER — ZOSTER VACCINE LIVE 19400 UNT/0.65ML ~~LOC~~ SOLR: 0.6500 mL | Freq: Once | SUBCUTANEOUS | Status: DC

## 2014-05-09 MED ORDER — SIMVASTATIN 40 MG PO TABS: 40.0000 mg | ORAL_TABLET | Freq: Every day | ORAL | Status: DC

## 2014-05-09 NOTE — Assessment & Plan Note (Signed)
She carries a diagnosis of vitamin D deficiency. A vitamin D level was obtained and is pending at the time of this dictation. If she is found to be deficient she may benefit from vitamin D replacement.

## 2014-05-09 NOTE — Assessment & Plan Note (Signed)
She only received a few weeks of relief from her bursitis after the right shoulder injection. The tramadol has also been relatively ineffective. We therefore decided to start diclofenac 50 mg by mouth 3 times a day as needed with food for the pain. We will reassess her response at the followup visit. If necessary we will change to another nonsteroidal that she has yet to try. It should be noted, she has failed ibuprofen and Naprosyn in the past.
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
## 2014-05-09 NOTE — Assessment & Plan Note (Signed)
She states she has not been compliant with her calcium and vitamin D. She was encouraged to restart this medication given its importance in bone health. A DEXA scan is due and a request was placed during this visit. We will reassess the stability of her osteopenia based on the results of the DEXA scan when available.

## 2014-05-09 NOTE — Assessment & Plan Note (Signed)
She was given a prescription for Zostavax today to take to the pharmacy, given her interest in receiving this vaccination. She is otherwise up-to-date on her preventive health care.

## 2014-05-09 NOTE — Assessment & Plan Note (Signed)
As mentioned above, her allergies have been problematic recently. She was encouraged to purchase over-the-counter loratadine in hopes of improving the symptoms and thus decreasing its impact on her asthma control.

## 2014-05-09 NOTE — Assessment & Plan Note (Signed)
This is well controlled on as needed over-the-counter Colace. She will continue this therapy.
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
## 2014-05-09 NOTE — Progress Notes (Signed)
   Subjective:    Patient ID: Colleen Medina, female    DOB: 1941-07-24, 73 y.o.   MRN: [id number]  HPI  Please see the A&P for the status of the pt's chronic medical problems.  Review of Systems  HENT: Positive for congestion, rhinorrhea and voice change.   Respiratory: Positive for chest tightness, shortness of breath and wheezing.   Cardiovascular: Negative for chest pain and leg swelling.  Musculoskeletal: Positive for arthralgias. Negative for back pain, gait problem, joint swelling, myalgias, neck pain and neck stiffness.  Skin: Negative for rash.  Psychiatric/Behavioral: Negative for dysphoric mood.      Objective:   Physical Exam  Nursing note and vitals reviewed. Constitutional: She is oriented to person, place, and time. She appears well-developed and well-nourished. No distress.  HENT:  Head: Normocephalic and atraumatic.  Eyes: Conjunctivae are normal. Right eye exhibits no discharge. Left eye exhibits no discharge. No scleral icterus.  Cardiovascular: Normal rate, regular rhythm and normal heart sounds.  Exam reveals no gallop and no friction rub.   No murmur heard. Pulmonary/Chest: Effort normal and breath sounds normal. No respiratory distress. She has no wheezes. She has no rales.  Abdominal: Soft. Bowel sounds are normal. She exhibits no distension. There is no tenderness. There is no rebound and no guarding.  Musculoskeletal: Normal range of motion. She exhibits no edema.  Tenderness to palpation of right shoulder laterally  Neurological: She is alert and oriented to person, place, and time. She exhibits normal muscle tone.  Skin: Skin is warm and dry. No rash noted. She is not diaphoretic. No erythema.  Psychiatric: She has a normal mood and affect. Her behavior is normal. Judgment and thought content normal.      Assessment & Plan:   Please see problem oriented charting.

## 2014-05-09 NOTE — Assessment & Plan Note (Signed)
She is doing well with weight loss and has lost another 3 pounds since the last visit. She is very close to having a BMI 25. We will continue to follow her progress at this point.

## 2014-05-09 NOTE — Assessment & Plan Note (Signed)
She recently has had trouble with her allergies. This has resulted in a slight worsening in her asthma control. She continues to use the albuterol as needed. Rather than escalating therapy at this time we will reassess her asthma control once over this bout of allergies. If she has persistent symptoms she would be a candidate for inhaled steroids.

## 2014-05-09 NOTE — Patient Instructions (Signed)
It was good to see you again.  I am sorry that your shoulder pain was only relieved for a few weeks after the injection.  You are doing well with your diabetes.  1) I prescribed diclofenac 50 mg by mouth every 8 hours as needed for pain.  This will replace the tramadol.  Please take with food.  2) I had you speak with the Social Worker about starting the process to place you husband in a facility given that he is becoming difficult to care for.  3) Think about taking an asprin a day as it may decrease your chances of having a stroke.  4) I ordered a DEXA scan to check your bone strength.  Please restart the calcium and vitamin D.  5) I checked you vitamin D level and cholesterol levels today to make sure they are OK.  I will call you early next week if we need to make any changes.  6) We gave you a prescription for the shingles vaccine today to take tot he pharmacy.  I will see you back in 3 months to see if the medication worked for your shoulder pain, sooner if necessary.

## 2014-05-09 NOTE — Assessment & Plan Note (Signed)
Her hemoglobin A1c today remains at target at 6.5. This is on diet alone. She was praised for her continued excellent diabetic control. Her blood pressure is also at target. A lipid panel was drawn and is pending at this time of dictation. Otherwise she is up-to-date on her diabetic preventative health maintenance.

## 2014-05-09 NOTE — Assessment & Plan Note (Signed)
She states she's been compliant with the simvastatin 40 mg by mouth at bedtime and is tolerating it well without myalgias. A lipid panel is drawn this morning and is pending at the time of this dictation. We will followup on the results of the lipid panel when back and make any appropriate adjustments as necessary.

## 2014-05-10 LAB — VITAMIN D 25 HYDROXY (VIT D DEFICIENCY, FRACTURES): Vit D, 25-Hydroxy: 16 ng/mL — ABNORMAL LOW (ref 30–89)

## 2014-05-13 ENCOUNTER — Encounter: Payer: Self-pay | Admitting: Licensed Clinical Social Worker

## 2014-05-20 NOTE — Progress Notes (Signed)
Total cholesterol 180 Triglycerides 58 HDL 55 LDL 113  LDL above target.  Will increase simvastatin to 80 mg daily as the alternative statins at [facility] are unlikely to help the patient reach target LDL levels.  We will repeat the lipid panel at the follow-up visit.  Vitamin D 16  She remains vitamin D deficient.  We will start vitamin D 50,000 units weekly and recheck a vitamin D level at the follow-up visit.

## 2014-06-12 MED ORDER — SIMVASTATIN 80 MG PO TABS: 80.0000 mg | ORAL_TABLET | Freq: Every day | ORAL | Status: DC

## 2014-06-12 MED ORDER — ERGOCALCIFEROL 1.25 MG (50000 UT) PO CAPS: 50000.0000 [IU] | ORAL_CAPSULE | ORAL | Status: DC

## 2014-06-12 NOTE — Progress Notes (Signed)
I got a hold of Colleen Medina and we discussed the lab results.  She is in agreement to increase the simvastatin and start the weekly vitamin D.

## 2014-06-12 NOTE — Addendum Note (Signed)
Addended by: Oval Linsey D on: 06/12/2014 10:28 AM   Modules accepted: Orders

## 2014-09-11 ENCOUNTER — Encounter: Payer: Self-pay | Admitting: Internal Medicine

## 2014-09-11 ENCOUNTER — Ambulatory Visit (INDEPENDENT_AMBULATORY_CARE_PROVIDER_SITE_OTHER): Payer: Commercial Managed Care - HMO | Admitting: Internal Medicine

## 2014-09-11 VITALS — BP 134/72 | HR 66 | Temp 98.2°F | Ht 59.0 in | Wt 124.0 lb

## 2014-09-11 DIAGNOSIS — J4520 Mild intermittent asthma, uncomplicated: Secondary | ICD-10-CM

## 2014-09-11 DIAGNOSIS — IMO0002 Reserved for concepts with insufficient information to code with codable children: Secondary | ICD-10-CM

## 2014-09-11 DIAGNOSIS — E559 Vitamin D deficiency, unspecified: Secondary | ICD-10-CM

## 2014-09-11 DIAGNOSIS — E785 Hyperlipidemia, unspecified: Secondary | ICD-10-CM

## 2014-09-11 DIAGNOSIS — K5909 Other constipation: Secondary | ICD-10-CM

## 2014-09-11 DIAGNOSIS — M899 Disorder of bone, unspecified: Secondary | ICD-10-CM

## 2014-09-11 DIAGNOSIS — J309 Allergic rhinitis, unspecified: Secondary | ICD-10-CM

## 2014-09-11 DIAGNOSIS — G589 Mononeuropathy, unspecified: Secondary | ICD-10-CM

## 2014-09-11 DIAGNOSIS — M7551 Bursitis of right shoulder: Secondary | ICD-10-CM

## 2014-09-11 DIAGNOSIS — M8580 Other specified disorders of bone density and structure, unspecified site: Secondary | ICD-10-CM

## 2014-09-11 DIAGNOSIS — J45909 Unspecified asthma, uncomplicated: Secondary | ICD-10-CM

## 2014-09-11 DIAGNOSIS — K5900 Constipation, unspecified: Secondary | ICD-10-CM

## 2014-09-11 DIAGNOSIS — Z23 Encounter for immunization: Secondary | ICD-10-CM

## 2014-09-11 DIAGNOSIS — E119 Type 2 diabetes mellitus without complications: Secondary | ICD-10-CM

## 2014-09-11 DIAGNOSIS — J302 Other seasonal allergic rhinitis: Secondary | ICD-10-CM

## 2014-09-11 DIAGNOSIS — G629 Polyneuropathy, unspecified: Secondary | ICD-10-CM

## 2014-09-11 DIAGNOSIS — M949 Disorder of cartilage, unspecified: Secondary | ICD-10-CM

## 2014-09-11 DIAGNOSIS — M75100 Unspecified rotator cuff tear or rupture of unspecified shoulder, not specified as traumatic: Secondary | ICD-10-CM

## 2014-09-11 LAB — GLUCOSE, CAPILLARY: GLUCOSE-CAPILLARY: 111 mg/dL — AB (ref 70–99)

## 2014-09-11 LAB — POCT GLYCOSYLATED HEMOGLOBIN (HGB A1C): Hemoglobin A1C: 6.2

## 2014-09-11 NOTE — Progress Notes (Signed)
   Subjective:    Patient ID: Colleen Medina, female    DOB: [birth year], 72 y.o.   MRN: [id number]  HPI  Please see the A&P for the status of the pt's chronic medical problems.  Review of Systems  Constitutional: Negative for activity change, appetite change and unexpected weight change.  HENT: Positive for rhinorrhea.   Respiratory: Negative for cough, chest tightness, shortness of breath and wheezing.   Cardiovascular: Negative for chest pain, palpitations and leg swelling.  Gastrointestinal: Positive for constipation. Negative for nausea and vomiting.  Musculoskeletal: Negative for neck pain and neck stiffness.  Skin: Negative for color change, rash and wound.  Allergic/Immunologic: Positive for environmental allergies.      Objective:   Physical Exam  Nursing note and vitals reviewed. Constitutional: She is oriented to person, place, and time. She appears well-developed and well-nourished. No distress.  HENT:  Head: Normocephalic and atraumatic.  Eyes: Conjunctivae are normal. Right eye exhibits no discharge. Left eye exhibits no discharge. No scleral icterus.  Neck: Normal range of motion. Neck supple.  Pulmonary/Chest: No stridor.  Musculoskeletal: Normal range of motion. She exhibits no edema and no tenderness.  Neurological: She is alert and oriented to person, place, and time. She has normal strength. She displays no atrophy and no tremor. No sensory deficit. She exhibits normal muscle tone.  Skin: Skin is warm and dry. No rash noted. She is not diaphoretic. No erythema.  Psychiatric: She has a normal mood and affect. Her behavior is normal. Judgment and thought content normal.      Assessment & Plan:   Please see problem oriented charting.

## 2014-09-11 NOTE — Assessment & Plan Note (Signed)
She is tolerating the simvastatin 80 mg daily without any myalgias. A lipid panel was drawn at this visit and is pending at the time of this dictation. We will assess her risk for a cardiovascular event in the next 10 years using the ACC-AHA Pooled Risk calculator once these results are back. Further adjustments in her statin regimen will be done at that time.

## 2014-09-11 NOTE — Assessment & Plan Note (Signed)
Her right shoulder continues to feel well after the subacromial steroid injection she received previously. We will continue to follow symptomatically.

## 2014-09-11 NOTE — Patient Instructions (Signed)
It was good to see you today.  You are doing a wonderful job with your diabetes!  1) Keep taking your medications as you are.  2) We gave you a flu shot today.  3) We drew some blood.  I will call you next week if there is anything we need to discuss.  4) We will try to have someone contact you with a date for your DEXA scan to follow-up on your bone health.  5) Please get your mammogram when due.  6) I will see you back in 6 months, sooner if necessary.

## 2014-09-11 NOTE — Assessment & Plan Note (Signed)
As noted above, she has had some difficulty with her allergic symptoms recently. At the same time, she just discovered that her water heater had a leak and there was mold associated with this. This is being addressed. We will reassess the control of her allergic symptoms after control of the mold in her house.

## 2014-09-11 NOTE — Assessment & Plan Note (Addendum)
Since last visit she states she's had 2 exacerbations of her asthma. This is well on albuterol as needed. She recently discovered that her water heater had a leak and it was associated with some mold. She would like to have this issue addressed prior to escalating her asthma therapy to include inhaled steroids given that her symptoms still remain very intermittent. We will reassess the control of her asthma at the followup visit.

## 2014-09-11 NOTE — Assessment & Plan Note (Addendum)
She received the Zostavax last week. Today we gave her the influenza vaccination. She is otherwise up-to-date on her health maintenance.

## 2014-09-11 NOTE — Assessment & Plan Note (Signed)
She states she's been compliant with her vitamin D therapy on a weekly basis. A level was checked to see if she is now replete. This result is pending at the time of this dictation but will be followed up and adjustments made as appropriate.

## 2014-09-11 NOTE — Assessment & Plan Note (Signed)
Her symptoms are well-controlled at this time. We will check a TSH level given the neuropathic symptoms that she is complaining of.

## 2014-09-11 NOTE — Assessment & Plan Note (Signed)
For the past month she has noted shocklike sensations radiating down both arms and to the left breast. These will occur every few days and last for only seconds at a time. They are not associated with any particular activity or position. She has had no new changes in terms of her activities and hobbies recently. She specifically denies any neck pain or neck stiffness. These sensations occur laterally down the extremities. In between these episodes she is without any pain, numbness, or weakness in any of these areas. Although these symptoms would be consistent with a cervical disc compression of the nerve roots to these areas the fact that she has no neck symptoms make this less likely, particularly since they are so fleeting and intermittent. Her diabetes has been extremely well controlled on diet alone and therefore I doubt this is related to some kind of diabetic neuropathy. We checked a vitamin B12 level, vitamin D level, and TSH to assess for possible deficiencies to explain these symptoms. Otherwise we will watch clinically and reassess at the followup visit.

## 2014-09-11 NOTE — Assessment & Plan Note (Signed)
This DEXA scan was reordered at the last visit but unfortunately she has not been notified of an appointment for this test. We will try to get this test scheduled so we may assess whether or not her osteopenia is progressive.

## 2014-09-11 NOTE — Assessment & Plan Note (Signed)
Her hemoglobin A1c today was down to 6.2 on diet alone. It had previously been 6.5 on diet. She was congratulated on her excellent control of her diabetes. She was encouraged to continue with her diet and activity level. She is up-to-date on all of her diabetic health maintenance items. We will reassess her diabetic control at the followup visit in 6 months.

## 2014-09-12 LAB — LIPID PANEL
CHOLESTEROL: 174 mg/dL (ref 0–200)
HDL: 61 mg/dL (ref >39)
LDL CALC: 99 mg/dL (ref 0–99)
TRIGLYCERIDES: 72 mg/dL (ref <150)
Total CHOL/HDL Ratio: 2.9 Ratio
VLDL: 14 mg/dL (ref 0–40)

## 2014-09-12 LAB — VITAMIN B12: Vitamin B-12: 356 pg/mL (ref 211–911)

## 2014-09-12 LAB — VITAMIN D 25 HYDROXY (VIT D DEFICIENCY, FRACTURES): Vit D, 25-Hydroxy: 48 ng/mL (ref 30–89)

## 2014-09-12 LAB — TSH: TSH: 2.358 u[IU]/mL (ref 0.350–4.500)

## 2014-09-16 LAB — HM DIABETES EYE EXAM

## 2014-09-23 ENCOUNTER — Encounter: Payer: Self-pay | Admitting: *Deleted

## 2014-09-24 NOTE — Progress Notes (Signed)
Total cholesterol 174 Triglycerides 72 HDL 61 LDL 99  ACC/AHA 10 year cardiovascular risk 12.1%  We will continue simvastatin 80 mg QHS.  Vitamin D 48  Level now replete.  We will continue Ca-Vit D for bone health at the current dose.  B12 356 TSH 2.358  These do not explain her symptoms suggesting a neuropathy.  We will continue to monitor clinically.  I called Ms. Cadavid to inform her of the results.

## 2014-10-06 ENCOUNTER — Observation Stay (HOSPITAL_COMMUNITY): Payer: Medicare HMO

## 2014-10-06 ENCOUNTER — Observation Stay (HOSPITAL_COMMUNITY)
Admission: EM | Admit: 2014-10-06 | Discharge: 2014-10-07 | Disposition: A | Discharge status: Home / Self Care | Payer: Medicare HMO | Attending: Internal Medicine | Admitting: Internal Medicine

## 2014-10-06 ENCOUNTER — Encounter (HOSPITAL_COMMUNITY): Payer: Self-pay | Admitting: Emergency Medicine

## 2014-10-06 ENCOUNTER — Telehealth: Payer: Self-pay | Admitting: *Deleted

## 2014-10-06 DIAGNOSIS — D1330 Benign neoplasm of unspecified part of small intestine: Secondary | ICD-10-CM | POA: Insufficient documentation

## 2014-10-06 DIAGNOSIS — K5900 Constipation, unspecified: Secondary | ICD-10-CM | POA: Insufficient documentation

## 2014-10-06 DIAGNOSIS — Z79899 Other long term (current) drug therapy: Secondary | ICD-10-CM | POA: Insufficient documentation

## 2014-10-06 DIAGNOSIS — E663 Overweight: Secondary | ICD-10-CM | POA: Insufficient documentation

## 2014-10-06 DIAGNOSIS — F329 Major depressive disorder, single episode, unspecified: Secondary | ICD-10-CM | POA: Insufficient documentation

## 2014-10-06 DIAGNOSIS — K5790 Diverticulosis of intestine, part unspecified, without perforation or abscess without bleeding: Secondary | ICD-10-CM | POA: Insufficient documentation

## 2014-10-06 DIAGNOSIS — J45909 Unspecified asthma, uncomplicated: Secondary | ICD-10-CM | POA: Insufficient documentation

## 2014-10-06 DIAGNOSIS — E559 Vitamin D deficiency, unspecified: Secondary | ICD-10-CM | POA: Diagnosis not present

## 2014-10-06 DIAGNOSIS — G44229 Chronic tension-type headache, not intractable: Secondary | ICD-10-CM | POA: Insufficient documentation

## 2014-10-06 DIAGNOSIS — I872 Venous insufficiency (chronic) (peripheral): Secondary | ICD-10-CM | POA: Diagnosis not present

## 2014-10-06 DIAGNOSIS — K219 Gastro-esophageal reflux disease without esophagitis: Secondary | ICD-10-CM | POA: Insufficient documentation

## 2014-10-06 DIAGNOSIS — E119 Type 2 diabetes mellitus without complications: Secondary | ICD-10-CM | POA: Insufficient documentation

## 2014-10-06 DIAGNOSIS — D509 Iron deficiency anemia, unspecified: Secondary | ICD-10-CM | POA: Insufficient documentation

## 2014-10-06 DIAGNOSIS — K449 Diaphragmatic hernia without obstruction or gangrene: Secondary | ICD-10-CM | POA: Insufficient documentation

## 2014-10-06 DIAGNOSIS — R079 Chest pain, unspecified: Principal | ICD-10-CM | POA: Insufficient documentation

## 2014-10-06 DIAGNOSIS — K648 Other hemorrhoids: Secondary | ICD-10-CM | POA: Diagnosis not present

## 2014-10-06 DIAGNOSIS — K644 Residual hemorrhoidal skin tags: Secondary | ICD-10-CM | POA: Diagnosis not present

## 2014-10-06 DIAGNOSIS — M1990 Unspecified osteoarthritis, unspecified site: Secondary | ICD-10-CM | POA: Diagnosis not present

## 2014-10-06 DIAGNOSIS — E785 Hyperlipidemia, unspecified: Secondary | ICD-10-CM | POA: Diagnosis not present

## 2014-10-06 DIAGNOSIS — M8580 Other specified disorders of bone density and structure, unspecified site: Secondary | ICD-10-CM | POA: Insufficient documentation

## 2014-10-06 LAB — BASIC METABOLIC PANEL
ANION GAP: 11 (ref 5–15)
BUN: 13 mg/dL (ref 6–23)
CO2: 28 mEq/L (ref 19–32)
Calcium: 9.2 mg/dL (ref 8.4–10.5)
Chloride: 102 mEq/L (ref 96–112)
Creatinine, Ser: 0.83 mg/dL (ref 0.50–1.10)
GFR calc non Af Amer: 69 mL/min — ABNORMAL LOW (ref >90)
GFR, EST AFRICAN AMERICAN: 80 mL/min — AB (ref >90)
Glucose, Bld: 102 mg/dL — ABNORMAL HIGH (ref 70–99)
POTASSIUM: 3.7 meq/L (ref 3.7–5.3)
Sodium: 141 mEq/L (ref 137–147)

## 2014-10-06 LAB — CBC
HCT: 34.8 % — ABNORMAL LOW (ref 36.0–46.0)
Hemoglobin: 10.6 g/dL — ABNORMAL LOW (ref 12.0–15.0)
MCH: 24 pg — ABNORMAL LOW (ref 26.0–34.0)
MCHC: 30.5 g/dL (ref 30.0–36.0)
MCV: 78.9 fL (ref 78.0–100.0)
PLATELETS: 240 10*3/uL (ref 150–400)
RBC: 4.41 MIL/uL (ref 3.87–5.11)
RDW: 14.6 % (ref 11.5–15.5)
WBC: 4 10*3/uL (ref 4.0–10.5)

## 2014-10-06 LAB — TROPONIN I: Troponin I: <0.3 ng/mL (ref <0.30)

## 2014-10-06 LAB — I-STAT TROPONIN, ED: TROPONIN I, POC: 0 ng/mL (ref 0.00–0.08)

## 2014-10-06 MED ORDER — INSULIN ASPART 100 UNIT/ML ~~LOC~~ SOLN: 0.0000 [IU] | Freq: Three times a day (TID) | SUBCUTANEOUS | Status: DC

## 2014-10-06 MED ORDER — MORPHINE SULFATE 2 MG/ML IJ SOLN: 2.0000 mg | INTRAMUSCULAR | Status: DC | PRN

## 2014-10-06 MED ORDER — NITROGLYCERIN 0.4 MG SL SUBL
0.4000 mg | SUBLINGUAL_TABLET | SUBLINGUAL | Status: DC | PRN
Start: 2014-10-06 — End: 2014-10-07
  Administered 2014-10-06 – 2014-10-07 (×2): 0.4 mg via SUBLINGUAL
  Filled 2014-10-06: qty 1

## 2014-10-06 MED ORDER — VITAMIN D (ERGOCALCIFEROL) 1.25 MG (50000 UNIT) PO CAPS
50000.0000 [IU] | ORAL_CAPSULE | ORAL | Status: DC
  Administered 2014-10-06: 50000 [IU] via ORAL
  Filled 2014-10-06 (×2): qty 1

## 2014-10-06 MED ORDER — ONDANSETRON HCL 4 MG/2ML IJ SOLN: 4.0000 mg | Freq: Four times a day (QID) | INTRAMUSCULAR | Status: DC | PRN

## 2014-10-06 MED ORDER — ASPIRIN 325 MG PO TABS
325.0000 mg | ORAL_TABLET | Freq: Once | ORAL | Status: AC
  Administered 2014-10-06: 325 mg via ORAL
  Filled 2014-10-06: qty 1

## 2014-10-06 MED ORDER — ALBUTEROL SULFATE (2.5 MG/3ML) 0.083% IN NEBU: 2.5000 mg | INHALATION_SOLUTION | Freq: Four times a day (QID) | RESPIRATORY_TRACT | Status: DC | PRN

## 2014-10-06 MED ORDER — SIMVASTATIN 40 MG PO TABS
80.0000 mg | ORAL_TABLET | Freq: Every day | ORAL | Status: DC
  Administered 2014-10-06: 80 mg via ORAL
  Filled 2014-10-06: qty 2

## 2014-10-06 MED ORDER — HEPARIN SODIUM (PORCINE) 5000 UNIT/ML IJ SOLN
5000.0000 [IU] | Freq: Three times a day (TID) | INTRAMUSCULAR | Status: DC
  Filled 2014-10-06: qty 1

## 2014-10-06 MED ORDER — ACETAMINOPHEN 325 MG PO TABS: 650.0000 mg | ORAL_TABLET | ORAL | Status: DC | PRN

## 2014-10-06 MED ORDER — PANTOPRAZOLE SODIUM 40 MG PO TBEC
40.0000 mg | DELAYED_RELEASE_TABLET | Freq: Every day | ORAL | Status: DC
  Administered 2014-10-06 – 2014-10-07 (×2): 40 mg via ORAL
  Filled 2014-10-06 (×2): qty 1

## 2014-10-06 NOTE — Telephone Encounter (Signed)
I called Colleen Medina and confirmed the history.  She states she has had a several week history of "clutching chest pain" that radiates to the left arm, and occasionally the right arm.  This pain has no known precipitant and can develop at rest.  She has several episodes per week and each episode will last for about 1 hour.  The pain resolves spontaneously with sitting.  There is nothing else she does that relieves the pain.  The pain is associated with diaphoresis of the forehead as well as SOB, but there is no nausea or dizziness.  Cardiac risk factors include diabetes and hyperlipidemia.  Her last clinic visit was less than 4 weeks ago.  Her ACC/AHA population risk assessment was 12.7% for a cardiovascular event in the next 10 years and she is on simvastatin 80 mg daily.  I am concerned this pain may be cardiac in nature given the description over the phone and her underlying risk.  I would see her in the clinic this afternoon but we have no open slots until tomorrow morning.  I believe this is too long to wait and I recommended that she go to the Emergency Department for further evaluation.  She states that she will.  I also advised her that if she changed her mind and did not go that I wanted her to call the clinic immediately so that we could see her tomorrow morning.
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
## 2014-10-06 NOTE — ED Notes (Signed)
Pt talking on cell phone in no distress when RN came to [facility] from the monitor.
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
## 2014-10-06 NOTE — Telephone Encounter (Signed)
Received a call from Dr Toma Copier wanting [place] to call his housekeeper, a pt of Dr Caroline More  I called pt and she has been having a clutching chest pain with radiation to arm.  On and Off.  Onset 2 weeks ago She has a choking feeling with SOB but denies nausea, she is not sure about diaphoresis Today she has a little pain in rt arm.  Hx of irregular heart beat.  Pt # [id number]

## 2014-10-06 NOTE — ED Notes (Signed)
Pt transported to xray 

## 2014-10-07 ENCOUNTER — Observation Stay (HOSPITAL_COMMUNITY): Payer: Medicare HMO

## 2014-10-07 ENCOUNTER — Encounter (HOSPITAL_COMMUNITY): Payer: Medicare HMO

## 2014-10-07 DIAGNOSIS — E785 Hyperlipidemia, unspecified: Secondary | ICD-10-CM | POA: Diagnosis not present

## 2014-10-07 DIAGNOSIS — R079 Chest pain, unspecified: Secondary | ICD-10-CM

## 2014-10-07 DIAGNOSIS — I369 Nonrheumatic tricuspid valve disorder, unspecified: Secondary | ICD-10-CM

## 2014-10-07 DIAGNOSIS — F329 Major depressive disorder, single episode, unspecified: Secondary | ICD-10-CM | POA: Diagnosis not present

## 2014-10-07 DIAGNOSIS — E119 Type 2 diabetes mellitus without complications: Secondary | ICD-10-CM | POA: Diagnosis not present

## 2014-10-07 LAB — TROPONIN I: Troponin I: <0.3 ng/mL (ref <0.30)

## 2014-10-07 LAB — GLUCOSE, CAPILLARY
GLUCOSE-CAPILLARY: 95 mg/dL (ref 70–99)
Glucose-Capillary: 85 mg/dL (ref 70–99)

## 2014-10-07 MED ORDER — NITROGLYCERIN 0.4 MG SL SUBL: 0.4000 mg | SUBLINGUAL_TABLET | SUBLINGUAL | Status: DC | PRN

## 2014-10-07 MED ORDER — NITROGLYCERIN 0.4 MG SL SUBL
SUBLINGUAL_TABLET | SUBLINGUAL | Status: AC
  Administered 2014-10-07: 0.4 mg via SUBLINGUAL
  Filled 2014-10-07: qty 1

## 2014-10-07 MED ORDER — IOHEXOL 350 MG/ML SOLN
80.0000 mL | Freq: Once | INTRAVENOUS | Status: AC | PRN
  Administered 2014-10-07: 80 mL via INTRAVENOUS

## 2014-10-07 NOTE — Discharge Summary (Signed)
INTERNAL MEDICINE ATTENDING DISCHARGE COSIGN   I evaluated the patient on the day of discharge and discussed the discharge plan with my resident team. I agree with the discharge documentation and disposition.   Colleen Medina 10/07/2014, 7:52 PM
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
## 2014-10-07 NOTE — Discharge Instructions (Addendum)
It was a pleasure take care of you at [facility]. Your heart imaging came back normal. Please return to the ED or seek medical attention if you have any new or worsening chest pain, shortness of breath, or other worrisome medical condition.  Lottie Mussel, MD Chest Pain (Nonspecific) It is often hard to give a specific diagnosis for the cause of chest pain. There is always a chance that your pain could be related to something serious, such as a heart attack or a blood clot in the lungs. You need to follow up with your health care provider for further evaluation. CAUSES   Heartburn.  Pneumonia or bronchitis.  Anxiety or stress.  Inflammation around your heart (pericarditis) or lung (pleuritis or pleurisy).  A blood clot in the lung.  A collapsed lung (pneumothorax). It can develop suddenly on its own (spontaneous pneumothorax) or from trauma to the chest.  Shingles infection (herpes zoster virus). The chest wall is composed of bones, muscles, and cartilage. Any of these can be the source of the pain.  The bones can be bruised by injury.  The muscles or cartilage can be strained by coughing or overwork.  The cartilage can be affected by inflammation and become sore (costochondritis). DIAGNOSIS  Lab tests or other studies may be needed to find the cause of your pain. Your health care provider may have you take a test called an ambulatory electrocardiogram (ECG). An ECG records your heartbeat patterns over a 24-hour period. You may also have other tests, such as:  Transthoracic echocardiogram (TTE). During echocardiography, sound waves are used to evaluate how blood flows through your heart.  Transesophageal echocardiogram (TEE).  Cardiac monitoring. This allows your health care provider to monitor your heart rate and rhythm in real time.  Holter monitor. This is a portable device that records your heartbeat and can help diagnose heart arrhythmias. It allows your health care provider  to track your heart activity for several days, if needed.  Stress tests by exercise or by giving medicine that makes the heart beat faster. TREATMENT   Treatment depends on what may be causing your chest pain. Treatment may include:  Acid blockers for heartburn.  Anti-inflammatory medicine.  Pain medicine for inflammatory conditions.  Antibiotics if an infection is present.  You may be advised to change lifestyle habits. This includes stopping smoking and avoiding alcohol, caffeine, and chocolate.  You may be advised to keep your head raised (elevated) when sleeping. This reduces the chance of acid going backward from your stomach into your esophagus. Most of the time, nonspecific chest pain will improve within 2-3 days with rest and mild pain medicine.  HOME CARE INSTRUCTIONS   If antibiotics were prescribed, take them as directed. Finish them even if you start to feel better.  For the next few days, avoid physical activities that bring on chest pain. Continue physical activities as directed.  Do not use any tobacco products, including cigarettes, chewing tobacco, or electronic cigarettes.  Avoid drinking alcohol.  Only take medicine as directed by your health care provider.  Follow your health care provider's suggestions for further testing if your chest pain does not go away.  Keep any follow-up appointments you made. If you do not go to an appointment, you could develop lasting (chronic) problems with pain. If there is any problem keeping an appointment, call to reschedule. SEEK MEDICAL CARE IF:   Your chest pain does not go away, even after treatment.  You have a rash with blisters  on your chest.  You have a fever. SEEK IMMEDIATE MEDICAL CARE IF:   You have increased chest pain or pain that spreads to your arm, neck, jaw, back, or abdomen.  You have shortness of breath.  You have an increasing cough, or you cough up blood.  You have severe back or abdominal  pain.  You feel nauseous or vomit.  You have severe weakness.  You faint.  You have chills. This is an emergency. Do not wait to see if the pain will go away. Get medical help at once. Call your local emergency services (911 in [place] Do not drive yourself to the hospital. MAKE SURE YOU:   Understand these instructions.  Will watch your condition.  Will get help right away if you are not doing well or get worse. Document Released: 09/14/2005 Document Revised: 12/10/2013 Document Reviewed: 07/10/2008 [facility] Patient Information 2015 [place], [place]. This information is not intended to replace advice given to you by your health care provider. Make sure you discuss any questions you have with your health care provider.  Diabetes and Exercise Exercising regularly is important. It is not just about losing weight. It has many health benefits, such as:  Improving your overall fitness, flexibility, and endurance.  Increasing your bone density.  Helping with weight control.  Decreasing your body fat.  Increasing your muscle strength.  Reducing stress and tension.  Improving your overall health. People with diabetes who exercise gain additional benefits because exercise:  Reduces appetite.  Improves the body's use of blood sugar (glucose).  Helps lower or control blood glucose.  Decreases blood pressure.  Helps control blood lipids (such as cholesterol and triglycerides).  Improves the body's use of the hormone insulin by:  Increasing the body's insulin sensitivity.  Reducing the body's insulin needs.  Decreases the risk for heart disease because exercising:  Lowers cholesterol and triglycerides levels.  Increases the levels of good cholesterol (such as high-density lipoproteins [HDL]) in the body.  Lowers blood glucose levels. YOUR ACTIVITY PLAN  Choose an activity that you enjoy and set realistic goals. Your health care provider or diabetes educator can help you  make an activity plan that works for you. Exercise regularly as directed by your health care provider. This includes:  Performing resistance training twice a week such as push-ups, sit-ups, lifting weights, or using resistance bands.  Performing 150 minutes of cardio exercises each week such as walking, running, or playing sports.  Staying active and spending no more than 90 minutes at one time being inactive. Even short bursts of exercise are good for you. Three 10-minute sessions spread throughout the day are just as beneficial as a single 30-minute session. Some exercise ideas include:  Taking the dog for a walk.  Taking the stairs instead of the elevator.  Dancing to your favorite song.  Doing an exercise video.  Doing your favorite exercise with a friend. RECOMMENDATIONS FOR EXERCISING WITH TYPE 1 OR TYPE 2 DIABETES   Check your blood glucose before exercising. If blood glucose levels are greater than 240 mg/dL, check for urine ketones. Do not exercise if ketones are present.  Avoid injecting insulin into areas of the body that are going to be exercised. For example, avoid injecting insulin into:  The arms when playing tennis.  The legs when jogging.  Keep a record of:  Food intake before and after you exercise.  Expected peak times of insulin action.  Blood glucose levels before and after you exercise.  The type  and amount of exercise you have done.  Review your records with your health care provider. Your health care provider will help you to develop guidelines for adjusting food intake and insulin amounts before and after exercising.  If you take insulin or oral hypoglycemic agents, watch for signs and symptoms of hypoglycemia. They include:  Dizziness.  Shaking.  Sweating.  Chills.  Confusion.  Drink plenty of water while you exercise to prevent dehydration or heat stroke. Body water is lost during exercise and must be replaced.  Talk to your health care  provider before starting an exercise program to make sure it is safe for you. Remember, almost any type of activity is better than none. Document Released: 02/25/2004 Document Revised: 04/21/2014 Document Reviewed: 05/14/2013 [facility] Patient Information 2015 [place], [place]. This information is not intended to replace advice given to you by your health care provider. Make sure you discuss any questions you have with your health care provider.

## 2014-10-07 NOTE — H&P (Signed)
Pt seen and examined. Case d/w Dr. Randell Patient. Please refer to resident note for details  In brief, 73 y/o female with PMH of DM, HLD, asthma, GERD p/w CP * 2 weeks. Pt states that CP is intermittent, occurs 3-4 times a week. Yesterday pt ahd an episode which lasted an hour- midsternal, radiating to both arms and to the right side of her neck. Pt c/o mild diaphoresis and associated SOB. No aggravating/relieving factors. No palpitations, no abd pain, no n/v, no lightheadedness, no syncope, no fevers. Remaining ROS negative  Exam: Gen: AAO*3, NAD CV: RRR, normal heart sounds Pulm: CTA b/l Abd: soft, non tender, BS + Ext- No edema  Assessment and Plan: 73 y/o female p/w CP  Chest Pain: - Monitor on tele - EKG with no acute ST/T wave changes - Troponins negative - Unable to get myoview done but cardiac CT done with no evidence of cardiac disease  Pt stable for d/c home today

## 2014-10-07 NOTE — Progress Notes (Signed)
Subjective: NAEON. She says she experienced some of the chest pain characterized as pressure overnight and it was relieved with nitroglycerin. The pain radiated up her R neck and R arm as She did not have any nausea, emesis, or diaphoresis during this episode. She was asymptomatic at the time of interview. She was seen by cardiology this morning who ordered a stress test and echo.  Objective: Vital signs in last 24 hours: Filed Vitals:   10/06/14 1830 10/06/14 1900 10/06/14 2040 10/07/14 0533  BP: 160/62 179/88 168/68 116/61  Pulse: 51 54 59 64  Temp:  98.8 F (37.1 C) 98.9 F (37.2 C) 98.1 F (36.7 C)  TempSrc:  Oral Oral Oral  Resp: 12 18 18 18   Height:      Weight:    123 lb 14.4 oz (56.2 kg)  SpO2: 98% 100% 99% 100%   Weight change:  No intake or output data in the 24 hours ending 10/07/14 1405  Gen: A&O x 4, no acute distress, well developed, well nourished HEENT: Atraumatic, PERRL, EOMI, sclerae anicteric, moist mucous membranes Heart: Regular rate and rhythm, normal S1 S2, no murmurs, rubs, or gallops Lungs: Clear to auscultation bilaterally, respirations unlabored Abd: Soft, non-tender, non-distended, + bowel sounds, no hepatosplenomegaly Ext: No edema or cyanosis   Lab Results: Basic Metabolic Panel:  Recent Labs Lab 10/06/14 1617  NA 141  K 3.7  CL 102  CO2 28  GLUCOSE 102*  BUN 13  CREATININE 0.83  CALCIUM 9.2   CBC:  Recent Labs Lab 10/06/14 1617  WBC 4.0  HGB 10.6*  HCT 34.8*  MCV 78.9  PLT 240   Cardiac Enzymes:  Recent Labs Lab 10/06/14 2015 10/06/14 2234 10/07/14 0120  TROPONINI <0.30 <0.30 <0.30   CBG:  Recent Labs Lab 10/07/14 0728 10/07/14 1125  GLUCAP 95 85   Misc. Labs: none EKG: NSR, t-wave abnormalities unchanged  Studies/Results: Dg Chest 2 View  10/06/2014   CLINICAL DATA:  73 year old female with acute central chest pain radiating to both upper extremities and associated with shortness of breath. Initial  encounter.  EXAM: CHEST  2 VIEW  COMPARISON:  07/22/2011.  FINDINGS: Chronic cardiomegaly has not significantly changed. Other mediastinal contours are within normal limits. Visualized tracheal air column is within normal limits. Stable lung volumes. No pneumothorax, pulmonary edema, pleural effusion or consolidation. No acute pulmonary opacity. Stable cholecystectomy clips. No acute osseous abnormality identified.  IMPRESSION: Stable cardiomegaly. No acute cardiopulmonary abnormality.   Electronically Signed   By: Lars Pinks M.D.   On: 10/06/2014 18:19   Ct Coronary Morp W/cta Cor W/score W/ca W/cm &/or Wo/cm  10/07/2014   ADDENDUM REPORT: 10/07/2014 13:34  CLINICAL DATA:  Chest pain  EXAM: Cardiac CTA  MEDICATIONS: Sub lingual nitro. 4mg  and lopressor 0mg   TECHNIQUE: The patient was scanned on a Philips 563 slice scanner. Gantry rotation speed was 270 msecs. Collimation was .69mm. A 100 kV prospective scan was triggered in the descending thoracic aorta at 111 HU's with 5% padding centered around 78% of the R-R interval. Average HR during the scan was 56 bpm. The 3D data set was interpreted on a dedicated work station using MPR, MIP and VRT modes. A total of 80cc of contrast was used.  FINDINGS: Non-cardiac: See separate report from [facility]. No significant findings on limited lung and soft tissue windows.  Calcium Score:  0  Ascending Aorta normal 3.3 cm  Coronary Arteries: Right dominant with no anomalies  LM: Normal  LAD:  Normal  D1:  Normal small  D2: Normal  D3:  Normal and large  D4: Normal  Circumflex:  Normal  OM1: Normal  OM2: Normal  RCA:  Dominant and normal  PDA: Normal  PLA:  Normal  IMPRESSION: 1) Calcium score of 0  2) Normal right dominant coronary arteries  3) Normal ascending aorta 3.3 cm  Jenkins Rouge   Electronically Signed   By: Jenkins Rouge M.D.   On: 10/07/2014 13:34   10/07/2014   EXAM: OVER-READ INTERPRETATION  CT CHEST  The following report is an over-read performed by  radiologist Dr. Maud Deed [facility] on 10/07/2014. This over-read does not include interpretation of cardiac or coronary anatomy or pathology. The coronary calcium score/coronary CTA interpretation by the cardiologist is attached.  COMPARISON:  Chest CT 04/02/2008.  FINDINGS: There are no enlarged mediastinal or hilar lymph nodes. A small hiatal hernia is noted. There is chronic central enlargement of the pulmonary arteries.  There is no significant pleural or pericardial effusion. There are no suspicious pulmonary findings. There is a stable calcified left upper lobe granuloma.  Small low-density lesions within the left hepatic lobe are stable. There is no evidence of adrenal mass.  IMPRESSION: 1. No acute or significant extracardiac findings identified within the lower chest. 2. Central enlargement of the pulmonary arteries suspicious for pulmonary arterial hypertension.  Electronically Signed: By: Camie Patience M.D. On: 10/07/2014 13:24   Medications: I have reviewed the patient's current medications. Scheduled Meds: . heparin  5,000 Units Subcutaneous 3 times per day  . insulin aspart  0-9 Units Subcutaneous TID WC  . pantoprazole  40 mg Oral Daily  . simvastatin  80 mg Oral QHS  . Vitamin D (Ergocalciferol)  50,000 Units Oral Q Mon   Continuous Infusions:  PRN Meds:.acetaminophen, albuterol, morphine injection, nitroGLYCERIN, ondansetron (ZOFRAN) IV Assessment/Plan: Principal Problem:   Chest pain on exertion Active Problems:   Chest pain  Chest pain: Patient admitted for chest pain rule-out. Pain is non-exertional pressure that radiates to R neck and R arm and was relieved with nitroglycerin. Troponins negative x 3. EKG unchanged. Patient seen by cardiology who ordered lexiscan myoview and echo as she has risk factors of atherosclerotic disease including DM and hypercholesterolemia and abnormal EKG at baseline. -f/u stress test and echo -tylenol 650mg  Q4hr prn mild pain,  morphine 2mg  Q2hr prn severe pain  -nitrostat SL 0.4mg  Q78min prn chest pain  -continue home statin 80mg  QHS  -cardiac monitoring  DM2: Diet controlled at home. Last hgb A1c 09/11/2014 6.2. CBGs 80s-110s on SSI. -SSI   Asthma: Ms. Suitt was on albuterol 108 mcg 2 puffs q6hr prn at home. Currently asymptomatic -albuterol 2.5mg  Q6hr prn SOB   GERD: Ms Corporan is on omeprazole 20mg  daily at home  -protonix 40mg  daily   Dispo: Disposition is deferred at this time, awaiting improvement of current medical problems.  Anticipated discharge in approximately 1 day(s).   The patient does have a current PCP Karren Cobble, MD) and does need an [facility] hospital follow-up appointment after discharge.  The patient does not know have transportation limitations that hinder transportation to clinic appointments.  .Services Needed at time of discharge: Y = Yes, Blank = No PT:   OT:   RN:   Equipment:   Other:     LOS: 1 day   Kelby Aline, MD 10/07/2014, 2:05 PM

## 2014-10-07 NOTE — Progress Notes (Signed)
Echocardiogram 2D Echocardiogram has been performed.  Colleen Medina 10/07/2014, 3:18 PM

## 2014-10-07 NOTE — Progress Notes (Signed)
Pt seen and examined. Please refer to my H and P note from today for further details. I agree with assessment and plan as outlined in Dr. Bebe Shaggy note

## 2014-10-07 NOTE — Discharge Summary (Signed)
Name: Colleen Medina MRN: [id number] DOB: [birth year] 73 y.o. PCP: Colleen Cobble, MD  Date of Admission: 10/06/2014  4:23 PM Date of Discharge: 10/07/2014 Attending Physician: Colleen Contes, MD  Discharge Diagnosis:  Principal Problem:   Chest pain on exertion Active Problems:   Chest pain  Discharge Medications:   Medication List         albuterol 108 (90 BASE) MCG/ACT inhaler  Commonly known as:  PROAIR HFA  Inhale 2 puffs into the lungs every 6 (six) hours as needed for shortness of breath.     omeprazole 20 MG capsule  Commonly known as:  PRILOSEC  Take 1 capsule (20 mg total) by mouth daily.     simvastatin 80 MG tablet  Commonly known as:  ZOCOR  Take 1 tablet (80 mg total) by mouth at bedtime.     Vitamin D (Ergocalciferol) 50000 UNITS Caps capsule  Commonly known as:  DRISDOL  Take 50,000 Units by mouth every Monday.     zoster vaccine live (PF) 19400 UNT/0.65ML injection  Commonly known as:  ZOSTAVAX  Inject 19,400 Units into the skin once.        Disposition and follow-up:   Colleen Medina was discharged from [facility] in Good condition.  At the hospital follow up visit please address:  1.  Chest pain symptoms, anemia   2.  Labs / imaging needed at time of follow-up: anemia panel  3.  Pending labs/ test needing follow-up: echo read  Follow-up Appointments: Follow-up Information   Follow up with Colleen Jewels, MD On 10/15/2014. (@ 10:15 am)    Specialty:  Internal Medicine   Contact information:   Colleen Medina [id number] [phone]       Discharge Instructions: Discharge Instructions   Diet - low sodium heart healthy    Complete by:  As directed      Increase activity slowly    Complete by:  As directed            Consultations: Treatment Team:  Colleen Coco, MD  Procedures Performed:  Dg Chest 2 View  10/06/2014   CLINICAL DATA:  73 year old female with acute central chest  pain radiating to both upper extremities and associated with shortness of breath. Initial encounter.  EXAM: CHEST  2 VIEW  COMPARISON:  07/22/2011.  FINDINGS: Chronic cardiomegaly has not significantly changed. Other mediastinal contours are within normal limits. Visualized tracheal air column is within normal limits. Stable lung volumes. No pneumothorax, pulmonary edema, pleural effusion or consolidation. No acute pulmonary opacity. Stable cholecystectomy clips. No acute osseous abnormality identified.  IMPRESSION: Stable cardiomegaly. No acute cardiopulmonary abnormality.   Electronically Signed   By: Colleen Medina M.D.   On: 10/06/2014 18:19   Ct Coronary Morp W/cta Cor W/score W/ca W/cm &/or Wo/cm  10/07/2014   ADDENDUM REPORT: 10/07/2014 [place]  CLINICAL DATA:  Chest pain  EXAM: Cardiac CTA  MEDICATIONS: Sub lingual nitro. 4mg  and lopressor 0mg   TECHNIQUE: The patient was scanned on a Philips 563 slice scanner. Gantry rotation speed was 270 msecs. Collimation was .24mm. A 100 kV prospective scan was triggered in the descending thoracic aorta at 111 HU's with 5% padding centered around 78% of the R-R interval. Average HR during the scan was 56 bpm. The 3D data set was interpreted on a dedicated work station using MPR, MIP and VRT modes. A total of 80cc of contrast was used.  FINDINGS: Non-cardiac: See separate report from  Colleen Medina. No significant findings on limited lung and soft tissue windows.  Calcium Score:  0  Ascending Aorta normal 3.3 cm  Coronary Arteries: Right dominant with no anomalies  LM: Normal  LAD:  Normal  D1:  Normal small  D2: Normal  D3:  Normal and large  D4: Normal  Circumflex:  Normal  OM1: Normal  OM2: Normal  RCA:  Dominant and normal  PDA: Normal  PLA:  Normal  IMPRESSION: 1) Calcium score of 0  2) Normal right dominant coronary arteries  3) Normal ascending aorta 3.3 cm  Colleen Medina   Electronically Signed   By: Colleen Medina M.D.   On: 10/07/2014 [place]   10/07/2014    EXAM: OVER-READ INTERPRETATION  CT CHEST  The following report is an over-read performed by radiologist Dr. Maud Medina [facility] [place] on 10/07/2014. This over-read does not include interpretation of cardiac or coronary anatomy or pathology. The coronary calcium score/coronary CTA interpretation by the cardiologist is attached.  COMPARISON:  Chest CT 04/02/2008.  FINDINGS: There are no enlarged mediastinal or hilar lymph nodes. A small hiatal hernia is noted. There is chronic central enlargement of the pulmonary arteries.  There is no significant pleural or pericardial effusion. There are no suspicious pulmonary findings. There is a stable calcified left upper lobe granuloma.  Small low-density lesions within the left hepatic lobe are stable. There is no evidence of adrenal mass.  IMPRESSION: 1. No acute or significant extracardiac findings identified within the lower chest. 2. Central enlargement of the pulmonary arteries suspicious for pulmonary arterial hypertension.  Electronically Signed: By: Colleen Medina M.D. On: 10/07/2014 13:24   EKG: NSR, t-wave abnormalities unchanged   Admission HPI: Colleen Medina is a 73 year old woman with history of DM2, HLD, asthma, GERD presenting with chest pain x 2 weeks. It occurs 3 to 4 times a week. It has not changed in frequency. She describes it as squeezing midsternal pain that radiates to her right arm and right neck. No association with activity - occurs at rest as well with exertion. No identified exacerbating or relieving factors. Resolves spontaneously after an hour. She denies change in energy level. She has had no previous episodes of similar pain. Associated diaphoresis and shortness of breath. She has a sensation of choking with some of these episodes. Denies fevers, chills, blurred vision, nausea, vomiting, reflux, heartburn, abdominal pain, diarrhea, weakness, paresthesias.  Hospital Course by problem list: Principal Problem:   Chest pain on  exertion Active Problems:   Chest pain   #Chest pain: Colleen Medina was admitted for chest pain rule-out. She received ASA 325 mg po in the ED. Her pain was a non-exertional pressure that radiated to the R neck and R arm and was relieved with nitroglycerin. She had this pain intermittently for two weeks. It occurred once overnight in the hospital and was relieved with nitroglycerin. She had troponins negative x 3 and an unchanged EKG while on cardiac monitoring. She was seen by cardiology who ordered a lexiscan myoview and echo as she has risk factors of atherosclerotic disease including DM and hypercholesterolemia and abnormal EKG at baseline. Her lexiscan was poor quality so cardiology got a coronary CTA. This demonstrated a calcium score of 0 and normal coronary arteries. She was then cleared for discharge by Dr. Johnsie Cancel. She also had an echo performed with the results pending at the time of discharge. Colleen Chismar is aware that the results of this test should be discussed at her follow-up  appointment.  She continued her home zocor while hospitalized.  #DM2: Colleen Degner's diabetes is diet controlled at home. Her last hgb A1c on 09/11/2014 was 6.2. Her CBGs were 80s-110s while on a sliding scale of insulin.  #Anemia: Colleen Cotrell's hemoglobin was 10.6 on admission. It appears her baseline level is ~11. There was no anemia work-up in her chart. This should be pursued as an outpatient as it is not an acute issue.  #Asthma: Colleen. Osterloh is on albuterol 108 mcg 2 puffs q6hr prn at home but she did not request any as she was asymptomatic.   #GERD: Colleen Paschal is on omeprazole 20mg  daily at home and continued with protonix 40mg  daily while hospitalized.   Discharge Vitals:   BP 159/82  Pulse 70  Temp(Src) 98.1 F (36.7 C) (Oral)  Resp 18  Ht 4\' 11"  (1.499 m)  Wt 123 lb 14.4 oz (56.2 kg)  BMI 25.01 kg/m2  SpO2 100%  Discharge Physical Exam: Gen: A&O x 4, no acute distress, well developed, well  nourished  HEENT: Atraumatic, PERRL, EOMI, sclerae anicteric, moist mucous membranes  Heart: Regular rate and rhythm, normal S1 S2, no murmurs, rubs, or gallops  Lungs: Clear to auscultation bilaterally, respirations unlabored  Abd: Soft, non-tender, non-distended, + bowel sounds, no hepatosplenomegaly  Ext: No edema or cyanosis   Discharge Labs:  Results for orders placed during the hospital encounter of 10/06/14 (from the past 24 hour(s))  TROPONIN I     Status: None   Collection Time    10/06/14  8:15 PM      Result Value Ref Range   Troponin I <0.30  <0.30 ng/mL  TROPONIN I     Status: None   Collection Time    10/06/14 10:34 PM      Result Value Ref Range   Troponin I <0.30  <0.30 ng/mL  TROPONIN I     Status: None   Collection Time    10/07/14  1:20 AM      Result Value Ref Range   Troponin I <0.30  <0.30 ng/mL  GLUCOSE, CAPILLARY     Status: None   Collection Time    10/07/14  7:28 AM      Result Value Ref Range   Glucose-Capillary 95  70 - 99 mg/dL  GLUCOSE, CAPILLARY     Status: None   Collection Time    10/07/14 11:25 AM      Result Value Ref Range   Glucose-Capillary 85  70 - 99 mg/dL    Signed: Kelby Aline, MD 10/07/2014, 5:00 PM    Services Ordered on Discharge: none Equipment Ordered on Discharge: none

## 2014-10-07 NOTE — Progress Notes (Signed)
UR completed 

## 2014-10-15 ENCOUNTER — Encounter: Payer: Self-pay | Admitting: Internal Medicine

## 2014-10-15 ENCOUNTER — Ambulatory Visit (INDEPENDENT_AMBULATORY_CARE_PROVIDER_SITE_OTHER): Payer: Commercial Managed Care - HMO | Admitting: Internal Medicine

## 2014-10-15 VITALS — BP 143/76 | HR 63 | Temp 97.9°F | Ht 59.0 in | Wt 124.2 lb

## 2014-10-15 DIAGNOSIS — R079 Chest pain, unspecified: Secondary | ICD-10-CM

## 2014-10-15 DIAGNOSIS — J4520 Mild intermittent asthma, uncomplicated: Secondary | ICD-10-CM

## 2014-10-15 DIAGNOSIS — Z636 Dependent relative needing care at home: Secondary | ICD-10-CM

## 2014-10-15 DIAGNOSIS — J45909 Unspecified asthma, uncomplicated: Secondary | ICD-10-CM

## 2014-10-15 DIAGNOSIS — D509 Iron deficiency anemia, unspecified: Secondary | ICD-10-CM

## 2014-10-15 DIAGNOSIS — T7491XD Unspecified adult maltreatment, confirmed, subsequent encounter: Secondary | ICD-10-CM

## 2014-10-15 DIAGNOSIS — R0789 Other chest pain: Secondary | ICD-10-CM

## 2014-10-15 MED ORDER — ALBUTEROL SULFATE HFA 108 (90 BASE) MCG/ACT IN AERS: 2.0000 | INHALATION_SPRAY | Freq: Four times a day (QID) | RESPIRATORY_TRACT | Status: DC | PRN

## 2014-10-15 NOTE — Progress Notes (Signed)
Patient ID: Colleen Medina, female   DOB: [birth year], 73 y.o.   MRN: [id number]  Case discussed with Dr. Gordy Levan soon after the resident saw the patient.  We reviewed the resident's history and exam and pertinent patient test results.  I agree with the assessment, diagnosis, and plan of care documented in the resident's note.
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
## 2014-10-15 NOTE — Patient Instructions (Signed)
Thank you for your visit today.   Please return to the internal medicine clinic in 3-4 month(s) or sooner if needed.     Your current medical regimen is effective;  continue present plan and take all medications as prescribed.    I have asked the social worker to provide you with some information on caregiver support groups in the area.    Please follow-up with the [facility] regarding your husband's care.  You may need to call them again.   Please be sure to bring all of your medications with you to every visit; this includes herbal supplements, vitamins, eye drops, and any over-the-counter medications.   Should you have any questions regarding your medications and/or any new or worsening symptoms, please be sure to call the clinic at [phone].   If you believe that you are suffering from a life threatening condition or one that may result in the loss of limb or function, then you should call 911 or proceed to the nearest Emergency Department.    Alzheimer Disease Caregiver Guide A person who has Alzheimer disease may not be able to take care of himself or herself. He or she may need help with simple tasks. The tips below can help you care for the person. MEMORY LOSS AND CONFUSION If the person is confused or cannot remember things:  Stay calm.  Respond with a short answer.  Avoid correcting him or her in a way that sounds like scolding.  Try not to take it personally, even if he or she forgets your name. BEHAVIOR CHANGES The person may go through behavior changes. This can include depression, anxiety, anger, or seeing things that are not there. When behavior changes:  Try not to take behavior changes personally.  Stay calm and patient.  Do not argue or try to convince the person about a specific point.  Know that these changes are part of the disease process. Try to work through it. TIPS TO LESSEN FRUSTRATION  Make appointments and do daily tasks when the person is at his or  her best.  Take your time. Simple tasks may take longer. Allow plenty of time to complete tasks.  Limit choices for the person.  Involve the person in what you are doing.  Stick to a routine.  Avoid new or crowded places, if possible.  Use simple words, short sentences, and a calm voice. Only give 1 direction at a time.  Buy clothes and shoes that are easy to put on and take off.  Let people help if they offer. HOME SAFETY  Keep floors clear. Remove rugs, magazine racks, and floor lamps.  Keep hallways well lit.  Put a handrail and nonslip mat in the bathtub or shower.  Put childproof locks on cabinets that have dangerous items in them. These items include medicine, alcohol, guns, toxic cleaning items, sharp tools, matches, or lighters.  Place locks on doors where the person cannot see or reach them. This helps the person to not wander out of the house and get lost.  Be prepared for emergencies. Keep a list of emergency phone numbers and addresses in a handy area. PLANS FOR THE FUTURE   Talk about finances.  Talk about money management. People with Alzheimer disease have trouble managing their money as the disease gets worse.  Get help from professional advisors about financial and legal matters.  Talk about future care.  Choose a power of attorney. This is someone who can make decisions for the person with  Alzheimer disease when he or she can no longer do so.  Talk about driving and when it is the right time to stop. The person's doctor can help with this.  If the person lives alone, make sure he or she is safe. Some people need extra help at home. Other people need more care at a nursing home or care center. SUPPORT GROUPS  Some benefits of joining a support group include:   Learning ways to manage stress.  Sharing experiences with others.  Getting emotional comfort and support.  Learning new caregiving skills as the disease progresses.  Knowing what  community resources are available and taking advantage of them. GET HELP IF:  The person has a fever.  The person has a sudden behavior change that does not get better with calming strategies.  The person is unable to manage his or her living situation.  The person threatens you or anyone else, including himself or herself.  You are no longer able to care for the person. Document Released: 02/27/2012 Document Revised: 04/21/2014 Document Reviewed: 02/27/2012 [facility] Patient Information 2015 [place], [place]. This information is not intended to replace advice given to you by your health care provider. Make sure you discuss any questions you have with your health care provider.

## 2014-10-15 NOTE — Assessment & Plan Note (Addendum)
Denies symptoms at this time.  -refilled albuterol
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
## 2014-10-15 NOTE — Assessment & Plan Note (Signed)
-  monitor; has had extensive work-up in the past and no need for additional studies

## 2014-10-30 ENCOUNTER — Ambulatory Visit (HOSPITAL_COMMUNITY)
Admission: RE | Admit: 2014-10-30 | Discharge: 2014-10-30 | Disposition: A | Discharge status: Home / Self Care | Payer: Medicare HMO | Source: Ambulatory Visit | Attending: Internal Medicine | Admitting: Internal Medicine

## 2014-10-30 ENCOUNTER — Inpatient Hospital Stay (HOSPITAL_COMMUNITY)
Admission: AD | Admit: 2014-10-30 | Discharge: 2014-11-01 | DRG: 392 | Disposition: A | Discharge status: Home / Self Care | Payer: Medicare HMO | Source: Ambulatory Visit | Attending: Infectious Diseases | Admitting: Infectious Diseases

## 2014-10-30 ENCOUNTER — Encounter: Payer: Self-pay | Admitting: Internal Medicine

## 2014-10-30 ENCOUNTER — Ambulatory Visit (INDEPENDENT_AMBULATORY_CARE_PROVIDER_SITE_OTHER): Payer: Commercial Managed Care - HMO | Admitting: Internal Medicine

## 2014-10-30 ENCOUNTER — Inpatient Hospital Stay (HOSPITAL_COMMUNITY): Payer: Medicare HMO

## 2014-10-30 ENCOUNTER — Encounter (HOSPITAL_COMMUNITY): Payer: Self-pay

## 2014-10-30 ENCOUNTER — Telehealth: Payer: Self-pay | Admitting: *Deleted

## 2014-10-30 VITALS — BP 149/85 | HR 80 | Temp 98.1°F

## 2014-10-30 DIAGNOSIS — K5790 Diverticulosis of intestine, part unspecified, without perforation or abscess without bleeding: Secondary | ICD-10-CM | POA: Diagnosis present

## 2014-10-30 DIAGNOSIS — F419 Anxiety disorder, unspecified: Secondary | ICD-10-CM | POA: Diagnosis present

## 2014-10-30 DIAGNOSIS — Z6822 Body mass index (BMI) 22.0-22.9, adult: Secondary | ICD-10-CM | POA: Diagnosis not present

## 2014-10-30 DIAGNOSIS — F329 Major depressive disorder, single episode, unspecified: Secondary | ICD-10-CM | POA: Diagnosis present

## 2014-10-30 DIAGNOSIS — Z882 Allergy status to sulfonamides status: Secondary | ICD-10-CM | POA: Diagnosis not present

## 2014-10-30 DIAGNOSIS — I872 Venous insufficiency (chronic) (peripheral): Secondary | ICD-10-CM | POA: Diagnosis present

## 2014-10-30 DIAGNOSIS — M8589 Other specified disorders of bone density and structure, multiple sites: Secondary | ICD-10-CM | POA: Diagnosis present

## 2014-10-30 DIAGNOSIS — R7611 Nonspecific reaction to tuberculin skin test without active tuberculosis: Secondary | ICD-10-CM | POA: Diagnosis present

## 2014-10-30 DIAGNOSIS — R1084 Generalized abdominal pain: Secondary | ICD-10-CM

## 2014-10-30 DIAGNOSIS — E11649 Type 2 diabetes mellitus with hypoglycemia without coma: Secondary | ICD-10-CM | POA: Diagnosis not present

## 2014-10-30 DIAGNOSIS — E559 Vitamin D deficiency, unspecified: Secondary | ICD-10-CM | POA: Diagnosis present

## 2014-10-30 DIAGNOSIS — K219 Gastro-esophageal reflux disease without esophagitis: Secondary | ICD-10-CM | POA: Diagnosis present

## 2014-10-30 DIAGNOSIS — Z79899 Other long term (current) drug therapy: Secondary | ICD-10-CM

## 2014-10-30 DIAGNOSIS — E785 Hyperlipidemia, unspecified: Secondary | ICD-10-CM | POA: Diagnosis present

## 2014-10-30 DIAGNOSIS — K449 Diaphragmatic hernia without obstruction or gangrene: Secondary | ICD-10-CM | POA: Diagnosis present

## 2014-10-30 DIAGNOSIS — K5900 Constipation, unspecified: Secondary | ICD-10-CM | POA: Diagnosis present

## 2014-10-30 DIAGNOSIS — D132 Benign neoplasm of duodenum: Secondary | ICD-10-CM | POA: Diagnosis present

## 2014-10-30 DIAGNOSIS — J45909 Unspecified asthma, uncomplicated: Secondary | ICD-10-CM | POA: Diagnosis present

## 2014-10-30 DIAGNOSIS — E1143 Type 2 diabetes mellitus with diabetic autonomic (poly)neuropathy: Secondary | ICD-10-CM

## 2014-10-30 DIAGNOSIS — E663 Overweight: Secondary | ICD-10-CM | POA: Diagnosis present

## 2014-10-30 DIAGNOSIS — Z9889 Other specified postprocedural states: Secondary | ICD-10-CM

## 2014-10-30 DIAGNOSIS — Z90710 Acquired absence of both cervix and uterus: Secondary | ICD-10-CM

## 2014-10-30 DIAGNOSIS — Z9049 Acquired absence of other specified parts of digestive tract: Secondary | ICD-10-CM | POA: Diagnosis present

## 2014-10-30 DIAGNOSIS — K644 Residual hemorrhoidal skin tags: Secondary | ICD-10-CM | POA: Diagnosis present

## 2014-10-30 DIAGNOSIS — K648 Other hemorrhoids: Secondary | ICD-10-CM | POA: Diagnosis present

## 2014-10-30 DIAGNOSIS — Z91419 Personal history of unspecified adult abuse: Secondary | ICD-10-CM

## 2014-10-30 DIAGNOSIS — D509 Iron deficiency anemia, unspecified: Secondary | ICD-10-CM | POA: Diagnosis present

## 2014-10-30 DIAGNOSIS — R109 Unspecified abdominal pain: Secondary | ICD-10-CM | POA: Diagnosis present

## 2014-10-30 DIAGNOSIS — Z880 Allergy status to penicillin: Secondary | ICD-10-CM

## 2014-10-30 DIAGNOSIS — Z833 Family history of diabetes mellitus: Secondary | ICD-10-CM

## 2014-10-30 DIAGNOSIS — E876 Hypokalemia: Secondary | ICD-10-CM | POA: Diagnosis not present

## 2014-10-30 LAB — GLUCOSE, CAPILLARY
GLUCOSE-CAPILLARY: 117 mg/dL — AB (ref 70–99)
Glucose-Capillary: 102 mg/dL — ABNORMAL HIGH (ref 70–99)
Glucose-Capillary: 112 mg/dL — ABNORMAL HIGH (ref 70–99)

## 2014-10-30 LAB — COMPLETE METABOLIC PANEL WITH GFR
ALT: 16 U/L (ref 0–35)
AST: 27 U/L (ref 0–37)
Albumin: 4.1 g/dL (ref 3.5–5.2)
Alkaline Phosphatase: 71 U/L (ref 39–117)
BUN: 13 mg/dL (ref 6–23)
CALCIUM: 9.9 mg/dL (ref 8.4–10.5)
CHLORIDE: 98 meq/L (ref 96–112)
CO2: 23 meq/L (ref 19–32)
Creat: 0.79 mg/dL (ref 0.50–1.10)
GFR, EST AFRICAN AMERICAN: 87 mL/min
GFR, EST NON AFRICAN AMERICAN: 76 mL/min
GLUCOSE: 127 mg/dL — AB (ref 70–99)
Potassium: 3.3 mEq/L — ABNORMAL LOW (ref 3.5–5.3)
Sodium: 138 mEq/L (ref 135–145)
Total Bilirubin: 0.9 mg/dL (ref 0.3–1.2)
Total Protein: 7.8 g/dL (ref 6.0–8.3)

## 2014-10-30 LAB — CBC WITH DIFFERENTIAL/PLATELET
BASOS PCT: 0 % (ref 0–1)
Basophils Absolute: 0 10*3/uL (ref 0.0–0.1)
EOS ABS: 0 10*3/uL (ref 0.0–0.7)
Eosinophils Relative: 0 % (ref 0–5)
HCT: 35.8 % — ABNORMAL LOW (ref 36.0–46.0)
HEMOGLOBIN: 11.5 g/dL — AB (ref 12.0–15.0)
LYMPHS ABS: 1.2 10*3/uL (ref 0.7–4.0)
Lymphocytes Relative: 24 % (ref 12–46)
MCH: 24.4 pg — AB (ref 26.0–34.0)
MCHC: 32.1 g/dL (ref 30.0–36.0)
MCV: 76 fL — ABNORMAL LOW (ref 78.0–100.0)
MONOS PCT: 5 % (ref 3–12)
Monocytes Absolute: 0.2 10*3/uL (ref 0.1–1.0)
NEUTROS ABS: 3.4 10*3/uL (ref 1.7–7.7)
NEUTROS PCT: 71 % (ref 43–77)
PLATELETS: 248 10*3/uL (ref 150–400)
RBC: 4.71 MIL/uL (ref 3.87–5.11)
RDW: 13.9 % (ref 11.5–15.5)
WBC: 4.8 10*3/uL (ref 4.0–10.5)

## 2014-10-30 LAB — LIPASE: Lipase: 18 U/L (ref 11–59)

## 2014-10-30 MED ORDER — INSULIN ASPART 100 UNIT/ML ~~LOC~~ SOLN
0.0000 [IU] | SUBCUTANEOUS | Status: DC
  Administered 2014-10-31 – 2014-11-01 (×2): 1 [IU] via SUBCUTANEOUS

## 2014-10-30 MED ORDER — MORPHINE SULFATE 2 MG/ML IJ SOLN
2.0000 mg | INTRAMUSCULAR | Status: DC | PRN
  Administered 2014-10-30 – 2014-11-01 (×5): 2 mg via INTRAVENOUS
  Filled 2014-10-30 (×5): qty 1

## 2014-10-30 MED ORDER — IOHEXOL 300 MG/ML  SOLN
80.0000 mL | Freq: Once | INTRAMUSCULAR | Status: AC | PRN
  Administered 2014-10-30: 100 mL via INTRAVENOUS

## 2014-10-30 MED ORDER — IOHEXOL 300 MG/ML  SOLN
25.0000 mL | INTRAMUSCULAR | Status: AC
  Administered 2014-10-30 (×2): 25 mL via ORAL

## 2014-10-30 MED ORDER — ONDANSETRON HCL 4 MG PO TABS: 4.0000 mg | ORAL_TABLET | Freq: Four times a day (QID) | ORAL | Status: DC | PRN

## 2014-10-30 MED ORDER — ONDANSETRON HCL 4 MG/2ML IJ SOLN
4.0000 mg | Freq: Four times a day (QID) | INTRAMUSCULAR | Status: DC | PRN
  Administered 2014-10-30: 4 mg via INTRAVENOUS
  Filled 2014-10-30: qty 2

## 2014-10-30 MED ORDER — POTASSIUM CHLORIDE 10 MEQ/100ML IV SOLN
10.0000 meq | INTRAVENOUS | Status: AC
  Administered 2014-10-30 – 2014-10-31 (×4): 10 meq via INTRAVENOUS
  Filled 2014-10-30 (×4): qty 100

## 2014-10-30 MED ORDER — SODIUM CHLORIDE 0.9 % IV SOLN
INTRAVENOUS | Status: AC
  Administered 2014-10-30: 1000 mL via INTRAVENOUS
  Administered 2014-10-31 (×2): via INTRAVENOUS

## 2014-10-30 MED ORDER — ENOXAPARIN SODIUM 30 MG/0.3ML ~~LOC~~ SOLN
30.0000 mg | SUBCUTANEOUS | Status: DC
  Administered 2014-10-30 – 2014-10-31 (×2): 30 mg via SUBCUTANEOUS
  Filled 2014-10-30 (×3): qty 0.3

## 2014-10-30 MED ORDER — PANTOPRAZOLE SODIUM 40 MG IV SOLR
40.0000 mg | Freq: Two times a day (BID) | INTRAVENOUS | Status: DC
  Administered 2014-10-30 – 2014-11-01 (×4): 40 mg via INTRAVENOUS
  Filled 2014-10-30 (×6): qty 40
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
## 2014-10-30 NOTE — Progress Notes (Signed)
AXR: No evidence of free air, obstruction, or ileus.  Stool throughout and air in the rectum.  CBC: Mild leukocytosis w/o a left shift.  The leukocytosis is chronic.  CMP, Lipase, and UA: Pending  She states her pain is no better than earlier, and if anything, slightly worse.  She does admit to some anxiety.  She is now OK with admission to obs to watch her closely overnight and hydrate.  If there is no improvement, deterioration, or her BMP demonstrates an acidosis further evaluation including scanning can be done.

## 2014-10-30 NOTE — Telephone Encounter (Signed)
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 Dr Levora Dredge called for pt to be seen  - since 10/27/14 n/v and diarrhea. Having severe abd pain. No diarrhea today. Talked with pt and aware of appt today 3:30PM Dr Ethelene Hal. Reached pt at cell # [phone]. Hilda Blades Miraya Cudney RN 10/30/14 1:50PM

## 2014-10-30 NOTE — Progress Notes (Signed)
Patient arrived in our unit at 17:30 o'clock. Alert and oriented x4; complaining of abdominal pain. Dr. Johnnye Sima was paged and informed her about patient's pain. She said she will come to check the patient and to put some orders; skin intact.  Orient patient to room and unit;  gave patient care guide; instructed how to use the call bell and  fall risk precautions. Will continue to monitor the patient.

## 2014-10-30 NOTE — Plan of Care (Signed)
Problem: Phase I Progression Outcomes Goal: Voiding-avoid urinary catheter unless indicated Outcome: Not Applicable Date Met:  75/33/91 Goal: Other Phase I Outcomes/Goals Outcome: Not Applicable Date Met:  79/21/78  Problem: Phase II Progression Outcomes Goal: Obtain order to discontinue catheter if appropriate Outcome: Not Applicable Date Met:  37/54/23 Goal: Other Phase II Outcomes/Goals Outcome: Not Applicable Date Met:  70/23/01  Problem: Phase III Progression Outcomes Goal: Voiding independently Outcome: Completed/Met Date Met:  10/30/14 Goal: Foley discontinued Outcome: Not Applicable Date Met:  72/09/10 Goal: Other Phase III Outcomes/Goals Outcome: Not Applicable Date Met:  68/16/61  Problem: Discharge Progression Outcomes Goal: Other Discharge Outcomes/Goals Outcome: Not Applicable Date Met:  96/94/09
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
## 2014-10-31 DIAGNOSIS — E119 Type 2 diabetes mellitus without complications: Secondary | ICD-10-CM

## 2014-10-31 DIAGNOSIS — K449 Diaphragmatic hernia without obstruction or gangrene: Secondary | ICD-10-CM

## 2014-10-31 DIAGNOSIS — K219 Gastro-esophageal reflux disease without esophagitis: Secondary | ICD-10-CM

## 2014-10-31 DIAGNOSIS — R109 Unspecified abdominal pain: Secondary | ICD-10-CM

## 2014-10-31 DIAGNOSIS — E785 Hyperlipidemia, unspecified: Secondary | ICD-10-CM

## 2014-10-31 LAB — COMPREHENSIVE METABOLIC PANEL
ALT: 10 U/L (ref 0–35)
ANION GAP: 10 (ref 5–15)
AST: 20 U/L (ref 0–37)
Albumin: 2.9 g/dL — ABNORMAL LOW (ref 3.5–5.2)
Alkaline Phosphatase: 55 U/L (ref 39–117)
BUN: 10 mg/dL (ref 6–23)
CALCIUM: 8.6 mg/dL (ref 8.4–10.5)
CO2: 25 mEq/L (ref 19–32)
Chloride: 102 mEq/L (ref 96–112)
Creatinine, Ser: 0.86 mg/dL (ref 0.50–1.10)
GFR, EST AFRICAN AMERICAN: 77 mL/min — AB (ref >90)
GFR, EST NON AFRICAN AMERICAN: 66 mL/min — AB (ref >90)
GLUCOSE: 87 mg/dL (ref 70–99)
Potassium: 4.1 mEq/L (ref 3.7–5.3)
SODIUM: 137 meq/L (ref 137–147)
Total Bilirubin: 1.2 mg/dL (ref 0.3–1.2)
Total Protein: 6 g/dL (ref 6.0–8.3)

## 2014-10-31 LAB — GLUCOSE, CAPILLARY
GLUCOSE-CAPILLARY: 87 mg/dL (ref 70–99)
GLUCOSE-CAPILLARY: 94 mg/dL (ref 70–99)
Glucose-Capillary: 101 mg/dL — ABNORMAL HIGH (ref 70–99)
Glucose-Capillary: 97 mg/dL (ref 70–99)
Glucose-Capillary: 150 mg/dL — ABNORMAL HIGH (ref 70–99)

## 2014-10-31 LAB — CBC
HCT: 30.2 % — ABNORMAL LOW (ref 36.0–46.0)
HEMOGLOBIN: 9.2 g/dL — AB (ref 12.0–15.0)
MCH: 23.2 pg — AB (ref 26.0–34.0)
MCHC: 30.5 g/dL (ref 30.0–36.0)
MCV: 76.1 fL — ABNORMAL LOW (ref 78.0–100.0)
PLATELETS: 205 10*3/uL (ref 150–400)
RBC: 3.97 MIL/uL (ref 3.87–5.11)
RDW: 14.1 % (ref 11.5–15.5)
WBC: 5.6 10*3/uL (ref 4.0–10.5)

## 2014-10-31 LAB — URINALYSIS, COMPLETE
Bilirubin Urine: NEGATIVE
Casts: NONE SEEN
Crystals: NONE SEEN
Glucose, UA: NEGATIVE mg/dL
KETONES UR: 40 mg/dL — AB
LEUKOCYTES UA: NEGATIVE
Nitrite: NEGATIVE
PH: 6 (ref 5.0–8.0)
Protein, ur: NEGATIVE mg/dL
Specific Gravity, Urine: 1.023 (ref 1.005–1.030)
UROBILINOGEN UA: 0.2 mg/dL (ref 0.0–1.0)

## 2014-10-31 MED ORDER — SENNOSIDES-DOCUSATE SODIUM 8.6-50 MG PO TABS
1.0000 | ORAL_TABLET | Freq: Once | ORAL | Status: AC
  Administered 2014-10-31: 1 via ORAL
  Filled 2014-10-31: qty 1

## 2014-10-31 MED ORDER — SENNOSIDES-DOCUSATE SODIUM 8.6-50 MG PO TABS: 1.0000 | ORAL_TABLET | Freq: Every day | ORAL | Status: DC

## 2014-10-31 MED ORDER — SENNOSIDES-DOCUSATE SODIUM 8.6-50 MG PO TABS
1.0000 | ORAL_TABLET | Freq: Two times a day (BID) | ORAL | Status: DC
  Administered 2014-11-01: 1 via ORAL
  Filled 2014-10-31: qty 1

## 2014-10-31 NOTE — Plan of Care (Signed)
Problem: Phase I Progression Outcomes Goal: OOB as tolerated unless otherwise ordered Outcome: Completed/Met Date Met:  10/31/14 Goal: Initial discharge plan identified Outcome: Completed/Met Date Met:  10/31/14 Goal: Hemodynamically stable Outcome: Completed/Met Date Met:  10/31/14

## 2014-10-31 NOTE — Care Management Note (Unsigned)
    Page 1 of 1   10/31/2014     5:11:10 PM CARE MANAGEMENT NOTE 10/31/2014  Patient:  Colleen Medina, Colleen Medina   Account Number:  [id number]  Date Initiated:  10/31/2014  Documentation initiated by:  Tomi Bamberger  Subjective/Objective Assessment:   dx acute abd pain  admit- lives with family.     Action/Plan:   Anticipated DC Date:  11/02/2014   Anticipated DC Plan:  [place]         Choice offered to / List presented to:             Status of service:  In process, will continue to follow Medicare Important Message given?   (If response is "NO", the following Medicare IM given date fields will be blank) Date Medicare IM given:   Medicare IM given by:   Date Additional Medicare IM given:   Additional Medicare IM given by:    Discharge Disposition:    Per UR Regulation:  Reviewed for med. necessity/level of care/duration of stay  If discussed at [place] of Stay Meetings, dates discussed:    Comments:  10/31/14 [place], BSN [phone] patient lives with spouse, NCM will continue to follow for dc needs.

## 2014-10-31 NOTE — Progress Notes (Signed)
Subjective:  Feels better today compared to yesterday. Nausea is better. Ab pain is better. Had some chills but no fever over night. No emesis. States that she had n/v/diarrhea nonbloody on 11/9 and 11/10. Then started having ab pain and nausea. No BM last 2 days. Usually has BM everyday. Her ab pain was squeezing in quality diffusely on abdomen without radiation to back, pain similar to her bowel obstruction in the past. Patient is passing gas. Pain was 10/10 yesterday but 5/10 today. Used to see Dr. Oletta Lamas for her GI. Had colonoscopies in the past several times because of "acid reflux". Patient is agreeable to try enema today.   Checked on patient later today, she had BM with it and feeling somewhat better.   Objective: Vital signs in last 24 hours: Filed Vitals:   10/30/14 1759 10/30/14 2119 10/30/14 2122 10/31/14 0546  BP:  137/75  116/58  Pulse:  61  56  Temp:   98.4 F (36.9 C) 98.2 F (36.8 C)  TempSrc:  Oral Oral Oral  Resp:  16  16  Height: 4\' 11"  (1.499 m)     Weight: 51.4 kg (113 lb 5.1 oz)     SpO2:  97%  100%   Weight change:  No intake or output data in the 24 hours ending 10/31/14 1240 Vitals reviewed. General: resting in bed, NAD HEENT: PERRL, EOMI, no scleral icterus Cardiac: RRR, no rubs, murmurs or gallops Pulm: clear to auscultation bilaterally, no wheezes, rales, or rhonchi Abd: soft, TTP mildly on lower abdomen diffusely, nondistended, BS present. Ext: warm and well perfused, no pedal edema Neuro: alert and oriented X3, cranial nerves II-XII grossly intact, strength and sensation to light touch equal in bilateral upper and lower extremities  Lab Results: Basic Metabolic Panel:  Recent Labs Lab 10/30/14 1608 10/31/14 0540  NA 138 137  K 3.3* 4.1  CL 98 102  CO2 23 25  GLUCOSE 127* 87  BUN 13 10  CREATININE 0.79 0.86  CALCIUM 9.9 8.6   Liver Function Tests:  Recent Labs Lab 10/30/14 1608 10/31/14 0540  AST 27 20  ALT 16 10  ALKPHOS 71  55  BILITOT 0.9 1.2  PROT 7.8 6.0  ALBUMIN 4.1 2.9*    Recent Labs Lab 10/30/14 1608  LIPASE 18   No results for input(s): AMMONIA in the last 168 hours. CBC:  Recent Labs Lab 10/30/14 1608 10/31/14 0540  WBC 4.8 5.6  NEUTROABS 3.4  --   HGB 11.5* 9.2*  HCT 35.8* 30.2*  MCV 76.0* 76.1*  PLT 248 205   Cardiac Enzymes: No results for input(s): CKTOTAL, CKMB, CKMBINDEX, TROPONINI in the last 168 hours. BNP: No results for input(s): PROBNP in the last 168 hours. D-Dimer: No results for input(s): DDIMER in the last 168 hours. CBG:  Recent Labs Lab 10/30/14 1754 10/30/14 1959 10/30/14 2351 10/31/14 0349 10/31/14 0752 10/31/14 1148  GLUCAP 117* 112* 102* 101* 97 87   Hemoglobin A1C: No results for input(s): HGBA1C in the last 168 hours. Fasting Lipid Panel: No results for input(s): CHOL, HDL, LDLCALC, TRIG, CHOLHDL, LDLDIRECT in the last 168 hours. Thyroid Function Tests: No results for input(s): TSH, T4TOTAL, FREET4, T3FREE, THYROIDAB in the last 168 hours. Coagulation: No results for input(s): LABPROT, INR in the last 168 hours. Anemia Panel: No results for input(s): VITAMINB12, FOLATE, FERRITIN, TIBC, IRON, RETICCTPCT in the last 168 hours. Urine Drug Screen: Drugs of Abuse  No results found for: LABOPIA, COCAINSCRNUR, [place], [place], [place],  LABBARB  Alcohol Level: No results for input(s): ETH in the last 168 hours. Urinalysis:  Recent Labs Lab 10/30/14 1616  COLORURINE YELLOW  LABSPEC 1.023  PHURINE 6.0  GLUCOSEU NEG  HGBUR SMALL*  BILIRUBINUR NEGATIVE  KETONESUR 40*  PROTEINUR NEGATIVE  UROBILINOGEN 0.2  NITRITE NEG  LEUKOCYTESUR NEGATIVE   Misc. Labs:  Micro Results: No results found for this or any previous visit (from the past 240 hour(s)). Studies/Results: Ct Abdomen Pelvis W Contrast  10/31/2014   CLINICAL DATA:  Severe generalized abdominal pain worse over the periumbilical region.  EXAM: CT ABDOMEN AND PELVIS WITH CONTRAST   TECHNIQUE: Multidetector CT imaging of the abdomen and pelvis was performed using the standard protocol following bolus administration of intravenous contrast.  CONTRAST:  148mL OMNIPAQUE IOHEXOL 300 MG/ML  SOLN  COMPARISON:  08/15/2012  FINDINGS: Lung bases are unremarkable.  Abdominal images demonstrate mild diffuse hepatic steatosis. There are a couple small sub cm hypodensities over the left lobe of the liver unchanged likely cysts. There has been a prior cholecystectomy. All stable mild prominence of the common bile duct and central intrahepatic ducts likely due to previous cholecystectomy. The spleen, pancreas and adrenal glands are normal. Kidneys normal in size without hydronephrosis or nephrolithiasis. Appendix is normal. There is no free fluid or inflammatory change. There is mild diverticulosis of the colon.  Pelvic images demonstrate multiple phleboliths. The bladder is within normal. Rectosigmoid colon is unremarkable. There are degenerative changes of the spine and hips.  IMPRESSION: No acute findings in the abdomen/ pelvis.  2 subcentimeter hypodensities over the left lobe of the liver likely cysts.  Mild diverticulosis of the colon.   Electronically Signed   By: Marin Olp M.D.   On: 10/31/2014 01:22   Dg Abd 2 Views  10/30/2014   CLINICAL DATA:  Abdominal pain with nausea, vomiting and diarrhea for 4 days. History of diabetes. Initial encounter.  EXAM: ABDOMEN - 2 VIEW  COMPARISON:  Abdominal pelvic CT 08/15/2012. Acute abdominal series done 08/20/2011.  FINDINGS: Bowel anastomosis and other surgical clips are noted. The bowel gas pattern is normal. There is no free intraperitoneal air. Pelvic calcifications typical of phleboliths are grossly stable. There are stable mild degenerative changes within the spine.  IMPRESSION: No acute abdominal findings.  Postsurgical changes as described.   Electronically Signed   By: Camie Patience M.D.   On: 10/30/2014 18:54   Medications: I have reviewed the  patient's current medications. Scheduled Meds: . enoxaparin (LOVENOX) injection  30 mg Subcutaneous Q24H  . insulin aspart  0-9 Units Subcutaneous 6 times per day  . pantoprazole (PROTONIX) IV  40 mg Intravenous Q12H   Continuous Infusions: . sodium chloride 100 mL/hr at 10/31/14 0752   PRN Meds:.morphine injection, ondansetron **OR** ondansetron (ZOFRAN) IV Assessment/Plan: Principal Problem:   Abdominal pain Active Problems:   Type 2 diabetes mellitus   Gastroesophageal reflux disease with hiatal hernia  73 yo female with dm II, HLD, diverticulosis, GERD with hiatal hernia, several ab sx in the past, here with ab pain.   Ab pain - likely 2/2 to constipation or functional ileus. CT abdomen doesn't show any obstruction al thought she has the risk of it since she had several ab sx in the past and also had bowel obstruction in the past. Shows mild diverticulosis without diverticulitis. Has some stool burden. Usually has 1 BM daily but no BM for last 2 days, has +flatus. Good BS. Pain is well controlled now morphine 2mg iv prn.  UA nl, CBC nl, Hgb stable around baseline 11.5. Lipase nl.  - was NPO last night for bowel rest.Today gave soap suds enema. Patient had BM with the enema and feels her pain and Nausea is somewhat better compared to yesterday.  - will advance diet slowly - bowel regimen - morphine 2mg  IV PRN for pain - zofran for nausea - cont protonix for GERD  Diabetes II - diet controlled. 6.2 hgba1c.  - ssi.   HLd - simvastatin  DVT/PE ppx: Lovenox 30 mg SQ daily, SCDs   Dispo: Disposition is deferred at this time, awaiting improvement of current medical problems.  Anticipated discharge in approximately 1-2 day(s).   The patient does have a current PCP Karren Cobble, MD) and does need an [facility] hospital follow-up appointment after discharge.  The patient does have transportation limitations that hinder transportation to clinic appointments.  .Services Needed at time  of discharge: Y = Yes, Blank = No PT:   OT:   RN:   Equipment:   Other:     LOS: 1 day   Dellia Nims, MD 10/31/2014, 12:40 PM

## 2014-10-31 NOTE — Progress Notes (Signed)
  Date: 10/31/2014  Patient name: Colleen Medina  Medical record number: [id number]  Date of birth: 25-Sep-1941   This patient has been seen and the plan of care was discussed with the house staff. Please see their note for complete details. I concur with their findings with the following additions/corrections:  Pt with improved abd pain today. Previously 10/10 now 5/10.   Today's Vitals   10/30/14 2252 10/31/14 0546 10/31/14 0823 10/31/14 1453  BP:  116/58  128/76  Pulse:  56  63  Temp:  98.2 F (36.8 C)  98.3 F (36.8 C)  TempSrc:  Oral  Oral  Resp:  16  16  Height:      Weight:      SpO2:  100%  99%  PainSc: 0-No pain  6     cv- rrr Chest- cta abd- bs+, soft, nontender extr- no edema.   Labs- Cr 0.86  WBC 5.6  H/h 9.2/30.2 (10.6/34.8) CT abd- No acute findings in the abdomen/ pelvis. 2 subcentimeter hypodensities over the left lobe of the liver likely cysts. Mild diverticulosis of the colon.  A/P Abd pain DM2 GERD Hyperlipidemia  Will improve pt's bowel hygiene If pain not better after BM, will have her seen by GI.  Diabetic control.  Campbell Riches, MD 10/31/2014, 3:06 PM

## 2014-11-01 ENCOUNTER — Encounter (HOSPITAL_COMMUNITY): Payer: Self-pay | Admitting: *Deleted

## 2014-11-01 LAB — BASIC METABOLIC PANEL
ANION GAP: 10 (ref 5–15)
BUN: 8 mg/dL (ref 6–23)
CALCIUM: 8.4 mg/dL (ref 8.4–10.5)
CHLORIDE: 101 meq/L (ref 96–112)
CO2: 25 mEq/L (ref 19–32)
Creatinine, Ser: 0.91 mg/dL (ref 0.50–1.10)
GFR calc Af Amer: 72 mL/min — ABNORMAL LOW (ref >90)
GFR calc non Af Amer: 62 mL/min — ABNORMAL LOW (ref >90)
Glucose, Bld: 89 mg/dL (ref 70–99)
Potassium: 3.8 mEq/L (ref 3.7–5.3)
Sodium: 136 mEq/L — ABNORMAL LOW (ref 137–147)

## 2014-11-01 LAB — GLUCOSE, CAPILLARY
GLUCOSE-CAPILLARY: 68 mg/dL — AB (ref 70–99)
Glucose-Capillary: 117 mg/dL — ABNORMAL HIGH (ref 70–99)
Glucose-Capillary: 129 mg/dL — ABNORMAL HIGH (ref 70–99)
Glucose-Capillary: 89 mg/dL (ref 70–99)
Glucose-Capillary: 102 mg/dL — ABNORMAL HIGH (ref 70–99)

## 2014-11-01 LAB — CBC
HCT: 30.3 % — ABNORMAL LOW (ref 36.0–46.0)
HEMOGLOBIN: 9.3 g/dL — AB (ref 12.0–15.0)
MCH: 24.2 pg — ABNORMAL LOW (ref 26.0–34.0)
MCHC: 30.7 g/dL (ref 30.0–36.0)
MCV: 78.9 fL (ref 78.0–100.0)
PLATELETS: 204 10*3/uL (ref 150–400)
RBC: 3.84 MIL/uL — ABNORMAL LOW (ref 3.87–5.11)
RDW: 14.8 % (ref 11.5–15.5)
WBC: 4.7 10*3/uL (ref 4.0–10.5)

## 2014-11-01 MED ORDER — SENNOSIDES-DOCUSATE SODIUM 8.6-50 MG PO TABS: 1.0000 | ORAL_TABLET | Freq: Two times a day (BID) | ORAL | Status: DC

## 2014-11-01 MED ORDER — POLYETHYLENE GLYCOL 3350 17 G PO PACK: 17.0000 g | PACK | Freq: Every day | ORAL | Status: DC

## 2014-11-01 NOTE — Plan of Care (Signed)
Problem: Phase II Progression Outcomes Goal: Progress activity as tolerated unless otherwise ordered Outcome: Progressing     

## 2014-11-01 NOTE — Discharge Summary (Signed)
Name: Colleen Medina MRN: [id number] DOB: 05/19/1941 73 y.o. PCP: Karren Cobble, MD  Date of Admission: 10/30/2014  5:31 PM Date of Discharge: 11/01/2014 Attending Physician: Campbell Riches, MD  Discharge Diagnosis: Principal Problem:   Abdominal pain Active Problems:   Type 2 diabetes mellitus   Gastroesophageal reflux disease with hiatal hernia  Discharge Medications:   Medication List    STOP taking these medications        zoster vaccine live (PF) 19400 UNT/0.65ML injection  Commonly known as:  ZOSTAVAX      TAKE these medications        albuterol 108 (90 BASE) MCG/ACT inhaler  Commonly known as:  PROAIR HFA  Inhale 2 puffs into the lungs every 6 (six) hours as needed for shortness of breath.     omeprazole 20 MG capsule  Commonly known as:  PRILOSEC  Take 1 capsule (20 mg total) by mouth daily.     polyethylene glycol packet  Commonly known as:  MIRALAX / GLYCOLAX  Take 17 g by mouth daily.     senna-docusate 8.6-50 MG per tablet  Commonly known as:  Senokot-S  Take 1 tablet by mouth 2 (two) times daily.     simvastatin 80 MG tablet  Commonly known as:  ZOCOR  Take 1 tablet (80 mg total) by mouth at bedtime.     Vitamin D (Ergocalciferol) 50000 UNITS Caps capsule  Commonly known as:  DRISDOL  Take 50,000 Units by mouth every Monday.        Disposition and follow-up:   [name] was discharged from [facility] in Stable condition.  At the hospital follow up visit please address:  1.  We have added a bowel regimen to help her keep BM's regular. She has microcytic anemia which per chart had been worked up extensively in the past and thought to be 2/2 to thalassemia.. We didn't order iron panel because we didn't want to subject her to blood draw the second time since she is doing so well. Her microcytic anemia should be worked up outpatient.   She states having intermittent ab pain that's mild-mod (different from the  episode leading to this admission) ever since she had her GASTROJEJUNOSTOMY on 2012. If pain recurs, should get GI referral outpatient.  2.  Labs / imaging needed at time of follow-up: CBC, iron panel?  3.  Pending labs/ test needing follow-up:   Follow-up Appointments: Follow-up Information    Follow up with Karren Cobble, MD.   Specialty:  Internal Medicine   Contact information:   [address] [place] [id number] [phone]       Discharge Instructions:   Consultations:    Procedures Performed:  Dg Chest 2 View  10/06/2014   CLINICAL DATA:  73 year old female with acute central chest pain radiating to both upper extremities and associated with shortness of breath. Initial encounter.  EXAM: CHEST  2 VIEW  COMPARISON:  07/22/2011.  FINDINGS: Chronic cardiomegaly has not significantly changed. Other mediastinal contours are within normal limits. Visualized tracheal air column is within normal limits. Stable lung volumes. No pneumothorax, pulmonary edema, pleural effusion or consolidation. No acute pulmonary opacity. Stable cholecystectomy clips. No acute osseous abnormality identified.  IMPRESSION: Stable cardiomegaly. No acute cardiopulmonary abnormality.   Electronically Signed   By: Lars Pinks M.D.   On: 10/06/2014 18:19   Ct Abdomen Pelvis W Contrast  10/31/2014   CLINICAL DATA:  Severe generalized abdominal  pain worse over the periumbilical region.  EXAM: CT ABDOMEN AND PELVIS WITH CONTRAST  TECHNIQUE: Multidetector CT imaging of the abdomen and pelvis was performed using the standard protocol following bolus administration of intravenous contrast.  CONTRAST:  133mL OMNIPAQUE IOHEXOL 300 MG/ML  SOLN  COMPARISON:  08/15/2012  FINDINGS: Lung bases are unremarkable.  Abdominal images demonstrate mild diffuse hepatic steatosis. There are a couple small sub cm hypodensities over the left lobe of the liver unchanged likely cysts. There has been a prior cholecystectomy. All stable  mild prominence of the common bile duct and central intrahepatic ducts likely due to previous cholecystectomy. The spleen, pancreas and adrenal glands are normal. Kidneys normal in size without hydronephrosis or nephrolithiasis. Appendix is normal. There is no free fluid or inflammatory change. There is mild diverticulosis of the colon.  Pelvic images demonstrate multiple phleboliths. The bladder is within normal. Rectosigmoid colon is unremarkable. There are degenerative changes of the spine and hips.  IMPRESSION: No acute findings in the abdomen/ pelvis.  2 subcentimeter hypodensities over the left lobe of the liver likely cysts.  Mild diverticulosis of the colon.   Electronically Signed   By: Marin Olp M.D.   On: 10/31/2014 01:22   Ct Coronary Morp W/cta Cor W/score W/ca W/cm &/or Wo/cm  10/07/2014   ADDENDUM REPORT: 10/07/2014 13:34  CLINICAL DATA:  Chest pain  EXAM: Cardiac CTA  MEDICATIONS: Sub lingual nitro. 4mg  and lopressor 0mg   TECHNIQUE: The patient was scanned on a Philips 482 slice scanner. Gantry rotation speed was 270 msecs. Collimation was .37mm. A 100 kV prospective scan was triggered in the descending thoracic aorta at 111 HU's with 5% padding centered around 78% of the R-R interval. Average HR during the scan was 56 bpm. The 3D data set was interpreted on a dedicated work station using MPR, MIP and VRT modes. A total of 80cc of contrast was used.  FINDINGS: Non-cardiac: See separate report from [facility]. No significant findings on limited lung and soft tissue windows.  Calcium Score:  0  Ascending Aorta normal 3.3 cm  Coronary Arteries: Right dominant with no anomalies  LM: Normal  LAD:  Normal  D1:  Normal small  D2: Normal  D3:  Normal and large  D4: Normal  Circumflex:  Normal  OM1: Normal  OM2: Normal  RCA:  Dominant and normal  PDA: Normal  PLA:  Normal  IMPRESSION: 1) Calcium score of 0  2) Normal right dominant coronary arteries  3) Normal ascending aorta 3.3 cm  Jenkins Rouge   Electronically Signed   By: Jenkins Rouge M.D.   On: 10/07/2014 13:34   10/07/2014   EXAM: OVER-READ INTERPRETATION  CT CHEST  The following report is an over-read performed by radiologist Dr. Maud Deed [facility] on 10/07/2014. This over-read does not include interpretation of cardiac or coronary anatomy or pathology. The coronary calcium score/coronary CTA interpretation by the cardiologist is attached.  COMPARISON:  Chest CT 04/02/2008.  FINDINGS: There are no enlarged mediastinal or hilar lymph nodes. A small hiatal hernia is noted. There is chronic central enlargement of the pulmonary arteries.  There is no significant pleural or pericardial effusion. There are no suspicious pulmonary findings. There is a stable calcified left upper lobe granuloma.  Small low-density lesions within the left hepatic lobe are stable. There is no evidence of adrenal mass.  IMPRESSION: 1. No acute or significant extracardiac findings identified within the lower chest. 2. Central enlargement of the pulmonary  arteries suspicious for pulmonary arterial hypertension.  Electronically Signed: By: Camie Patience M.D. On: 10/07/2014 13:24   Dg Abd 2 Views  10/30/2014   CLINICAL DATA:  Abdominal pain with nausea, vomiting and diarrhea for 4 days. History of diabetes. Initial encounter.  EXAM: ABDOMEN - 2 VIEW  COMPARISON:  Abdominal pelvic CT 08/15/2012. Acute abdominal series done 08/20/2011.  FINDINGS: Bowel anastomosis and other surgical clips are noted. The bowel gas pattern is normal. There is no free intraperitoneal air. Pelvic calcifications typical of phleboliths are grossly stable. There are stable mild degenerative changes within the spine.  IMPRESSION: No acute abdominal findings.  Postsurgical changes as described.   Electronically Signed   By: Camie Patience M.D.   On: 10/30/2014 18:54    2D Echo:   Cardiac Cath:   Admission HPI:   Ms. Mcneely is a 73 yo female with PMHx of diabetes,  hyperlipidemia, diverticulosis, gastroesophageal reflux disease with hiatal hernia, diverticulosis, and depression who presented to the clinic with a 3 day history of abdominal pain. Patient describes her pain as severe, crampy, located in the mid abdomen, periumbilical without radiation. Patient had nausea, vomiting and diarrhea for 2 days which resolved. Emesis and diarrhea were non-bloody. After resolution, patient continued to have nausea and sudden onset of abdominal pain. She admits to loss of appetite and has not had a bowel movement in 2 days. She passed gas yesterday. Pain is a 10/10 and nothing makes it better. Movement and eating make the pain worse. She tried tramadol at home without relief. Patient has had pain in the past that was similar in nature when she had a SBO. Patient does admit to stress, depression and anxiety.   Hospital Course by problem list: Principal Problem:   Abdominal pain Active Problems:   Type 2 diabetes mellitus   Gastroesophageal reflux disease with hiatal hernia   73 yo female with dm II, HLD, diverticulosis, GERD with hiatal hernia, several ab sx in the past, here with ab pain 2/2 to constipation  Ab pain - likely 2/2 to constipation. CT abdomen doesn't show any obstruction al though she has the risk of it since she had several ab sx in the past and also had bowel obstruction in the past. Shows mild diverticulosis without diverticulitis. Has some stool burden. Usually has 1 BM daily but no BM for last 2 days prior to admission. UA nl, no white count. Hgb slightly down from her baseline, no apparent active bleeding. Lipase nl.   - kept NPO first night for bowel rest. Gave soap suds enema 2nd day. Patient had BM with the enema and feels her pain and Nausea both resolved.  - advanced diet to regular as she tolerated liquid diet without problem.  - will start bowel regimen: sennokot 1 tablet bid. Can d/c if doesn't need it. - morphine 2mg  IV PRN for pain while here  for pain. Has barely used after the first day. Will not prescribe on discharge. - zofran for nausea - cont protonix for GERD  Patient states she has intermittent mild-mod ab pain chronically since having her GASTROJEJUNOSTOMY on 2012. If pain recurs, may need GI follow up outpatient.  Diabetes II - diet controlled. 6.2 hgba1c.  - ssi.  HLd - simvastatin  Microcytic anemia - baseline around 10.6. Currently 9.3. May be due to dilution with IVF. Denies any bleeding or stool color changes. per chart, this has been worked up extensively and thought to be 2/2 to thalassemia.  -  should follow up outpatient.   DVT/PE ppx: Lovenox 30 mg SQ daily, SCDs   Discharge Vitals:   BP 149/80 mmHg  Pulse 55  Temp(Src) 98.4 F (36.9 C) (Oral)  Resp 18  Ht 4\' 11"  (1.499 m)  Wt 51.4 kg (113 lb 5.1 oz)  BMI 22.87 kg/m2  SpO2 100%  Discharge Labs:  Results for orders placed or performed during the hospital encounter of 10/30/14 (from the past 24 hour(s))  Glucose, capillary     Status: None   Collection Time: 10/31/14  4:29 PM  Result Value Ref Range   Glucose-Capillary 94 70 - 99 mg/dL  Glucose, capillary     Status: Abnormal   Collection Time: 10/31/14  8:25 PM  Result Value Ref Range   Glucose-Capillary 150 (H) 70 - 99 mg/dL  Glucose, capillary     Status: Abnormal   Collection Time: 11/01/14 12:13 AM  Result Value Ref Range   Glucose-Capillary 68 (L) 70 - 99 mg/dL  Glucose, capillary     Status: Abnormal   Collection Time: 11/01/14  1:01 AM  Result Value Ref Range   Glucose-Capillary 117 (H) 70 - 99 mg/dL  Glucose, capillary     Status: Abnormal   Collection Time: 11/01/14  4:19 AM  Result Value Ref Range   Glucose-Capillary 129 (H) 70 - 99 mg/dL  Basic metabolic panel     Status: Abnormal   Collection Time: 11/01/14  6:05 AM  Result Value Ref Range   Sodium 136 (L) 137 - 147 mEq/L   Potassium 3.8 3.7 - 5.3 mEq/L   Chloride 101 96 - 112 mEq/L   CO2 25 19 - 32 mEq/L   Glucose,  Bld 89 70 - 99 mg/dL   BUN 8 6 - 23 mg/dL   Creatinine, Ser 0.91 0.50 - 1.10 mg/dL   Calcium 8.4 8.4 - 10.5 mg/dL   GFR calc non Af Amer 62 (L) >90 mL/min   GFR calc Af Amer 72 (L) >90 mL/min   Anion gap 10 5 - 15  CBC     Status: Abnormal   Collection Time: 11/01/14  6:05 AM  Result Value Ref Range   WBC 4.7 4.0 - 10.5 K/uL   RBC 3.84 (L) 3.87 - 5.11 MIL/uL   Hemoglobin 9.3 (L) 12.0 - 15.0 g/dL   HCT 30.3 (L) 36.0 - 46.0 %   MCV 78.9 78.0 - 100.0 fL   MCH 24.2 (L) 26.0 - 34.0 pg   MCHC 30.7 30.0 - 36.0 g/dL   RDW 14.8 11.5 - 15.5 %   Platelets 204 150 - 400 K/uL  Glucose, capillary     Status: None   Collection Time: 11/01/14  7:47 AM  Result Value Ref Range   Glucose-Capillary 89 70 - 99 mg/dL    Signed: Dellia Nims, MD 11/01/2014, 12:14 PM    Services Ordered on Discharge:  Equipment Ordered on Discharge:

## 2014-11-01 NOTE — Progress Notes (Signed)
Subjective:  Had 1 bm yesterday afternoon after the enema. No BM after that yet. Started on bowel regimen yesterday. Tolerating liquid diet. No nausea. No abdominal pain. No fever/chills or other complaints.  Had 1x hypoglycemia episode likely 2/2 to not eating that much. No symptoms. Resolved with carb snack. Objective: Vital signs in last 24 hours: Filed Vitals:   10/31/14 0546 10/31/14 1453 10/31/14 2136 11/01/14 0554  BP: 116/58 128/76 138/76 149/80  Pulse: 56 63 62 55  Temp: 98.2 F (36.8 C) 98.3 F (36.8 C) 98.3 F (36.8 C) 98.4 F (36.9 C)  TempSrc: Oral Oral Oral Oral  Resp: 16 16 16 18   Height:      Weight:      SpO2: 100% 99% 100% 100%   Weight change:   Intake/Output Summary (Last 24 hours) at 11/01/14 0742 Last data filed at 11/01/14 0651  Gross per 24 hour  Intake 2888.27 ml  Output      0 ml  Net 2888.27 ml   Vitals reviewed. General: resting in bed, NAD HEENT: PERRL, EOMI, no scleral icterus Cardiac: RRR, no rubs, murmurs or gallops Pulm: clear to auscultation bilaterally, no wheezes, rales, or rhonchi Abd: soft, no tenderness today. nondistended, BS present. Ext: warm and well perfused, no pedal edema Neuro: alert and oriented X3, cranial nerves II-XII grossly intact, strength and sensation to light touch equal in bilateral upper and lower extremities  Lab Results: Basic Metabolic Panel:  Recent Labs Lab 10/31/14 0540 11/01/14 0605  NA 137 136*  K 4.1 3.8  CL 102 101  CO2 25 25  GLUCOSE 87 89  BUN 10 8  CREATININE 0.86 0.91  CALCIUM 8.6 8.4   Liver Function Tests:  Recent Labs Lab 10/30/14 1608 10/31/14 0540  AST 27 20  ALT 16 10  ALKPHOS 71 55  BILITOT 0.9 1.2  PROT 7.8 6.0  ALBUMIN 4.1 2.9*    Recent Labs Lab 10/30/14 1608  LIPASE 18   No results for input(s): AMMONIA in the last 168 hours. CBC:  Recent Labs Lab 10/30/14 1608 10/31/14 0540 11/01/14 0605  WBC 4.8 5.6 4.7  NEUTROABS 3.4  --   --   HGB 11.5* 9.2*  9.3*  HCT 35.8* 30.2* 30.3*  MCV 76.0* 76.1* 78.9  PLT 248 205 204   Cardiac Enzymes: No results for input(s): CKTOTAL, CKMB, CKMBINDEX, TROPONINI in the last 168 hours. BNP: No results for input(s): PROBNP in the last 168 hours. D-Dimer: No results for input(s): DDIMER in the last 168 hours. CBG:  Recent Labs Lab 10/31/14 1148 10/31/14 1629 10/31/14 2025 11/01/14 0013 11/01/14 0101 11/01/14 0419  GLUCAP 87 94 150* 68* 117* 129*   Hemoglobin A1C: No results for input(s): HGBA1C in the last 168 hours. Fasting Lipid Panel: No results for input(s): CHOL, HDL, LDLCALC, TRIG, CHOLHDL, LDLDIRECT in the last 168 hours. Thyroid Function Tests: No results for input(s): TSH, T4TOTAL, FREET4, T3FREE, THYROIDAB in the last 168 hours. Coagulation: No results for input(s): LABPROT, INR in the last 168 hours. Anemia Panel: No results for input(s): VITAMINB12, FOLATE, FERRITIN, TIBC, IRON, RETICCTPCT in the last 168 hours. Urine Drug Screen: Drugs of Abuse  No results found for: LABOPIA, COCAINSCRNUR, LABBENZ, AMPHETMU, THCU, LABBARB  Alcohol Level: No results for input(s): ETH in the last 168 hours. Urinalysis:  Recent Labs Lab 10/30/14 1616  COLORURINE YELLOW  LABSPEC 1.023  PHURINE 6.0  GLUCOSEU NEG  HGBUR SMALL*  BILIRUBINUR NEGATIVE  KETONESUR 40*  PROTEINUR NEGATIVE  UROBILINOGEN 0.2  NITRITE NEG  LEUKOCYTESUR NEGATIVE   Misc. Labs:  Micro Results: No results found for this or any previous visit (from the past 240 hour(s)). Studies/Results: Ct Abdomen Pelvis W Contrast  10/31/2014   CLINICAL DATA:  Severe generalized abdominal pain worse over the periumbilical region.  EXAM: CT ABDOMEN AND PELVIS WITH CONTRAST  TECHNIQUE: Multidetector CT imaging of the abdomen and pelvis was performed using the standard protocol following bolus administration of intravenous contrast.  CONTRAST:  162mL OMNIPAQUE IOHEXOL 300 MG/ML  SOLN  COMPARISON:  08/15/2012  FINDINGS: Lung bases  are unremarkable.  Abdominal images demonstrate mild diffuse hepatic steatosis. There are a couple small sub cm hypodensities over the left lobe of the liver unchanged likely cysts. There has been a prior cholecystectomy. All stable mild prominence of the common bile duct and central intrahepatic ducts likely due to previous cholecystectomy. The spleen, pancreas and adrenal glands are normal. Kidneys normal in size without hydronephrosis or nephrolithiasis. Appendix is normal. There is no free fluid or inflammatory change. There is mild diverticulosis of the colon.  Pelvic images demonstrate multiple phleboliths. The bladder is within normal. Rectosigmoid colon is unremarkable. There are degenerative changes of the spine and hips.  IMPRESSION: No acute findings in the abdomen/ pelvis.  2 subcentimeter hypodensities over the left lobe of the liver likely cysts.  Mild diverticulosis of the colon.   Electronically Signed   By: Marin Olp M.D.   On: 10/31/2014 01:22   Dg Abd 2 Views  10/30/2014   CLINICAL DATA:  Abdominal pain with nausea, vomiting and diarrhea for 4 days. History of diabetes. Initial encounter.  EXAM: ABDOMEN - 2 VIEW  COMPARISON:  Abdominal pelvic CT 08/15/2012. Acute abdominal series done 08/20/2011.  FINDINGS: Bowel anastomosis and other surgical clips are noted. The bowel gas pattern is normal. There is no free intraperitoneal air. Pelvic calcifications typical of phleboliths are grossly stable. There are stable mild degenerative changes within the spine.  IMPRESSION: No acute abdominal findings.  Postsurgical changes as described.   Electronically Signed   By: Camie Patience M.D.   On: 10/30/2014 18:54   Medications: I have reviewed the patient's current medications. Scheduled Meds: . enoxaparin (LOVENOX) injection  30 mg Subcutaneous Q24H  . insulin aspart  0-9 Units Subcutaneous 6 times per day  . pantoprazole (PROTONIX) IV  40 mg Intravenous Q12H  . senna-docusate  1 tablet Oral BID    Continuous Infusions:   PRN Meds:.morphine injection, ondansetron **OR** ondansetron (ZOFRAN) IV Assessment/Plan: Principal Problem:   Abdominal pain Active Problems:   Type 2 diabetes mellitus   Gastroesophageal reflux disease with hiatal hernia  73 yo female with dm II, HLD, diverticulosis, GERD with hiatal hernia, several ab sx in the past, here with ab pain 2/2 to constipation  Ab pain - likely 2/2 to constipation. CT abdomen doesn't show any obstruction al though she has the risk of it since she had several ab sx in the past and also had bowel obstruction in the past. Shows mild diverticulosis without diverticulitis. Has some stool burden. Usually has 1 BM daily but no BM for last 2 days prior to admission. UA nl, CBC nl, Hgb stable around baseline 11.5. Lipase nl.   - Gave soap suds enema. Patient had BM with the enema and feels her pain and Nausea both resolved.  - advanced diet to regular as she tolerated liquid diet without problem. Will discharge if tolerates breakfast and lunch. - bowel regimen: sennokot  1 tablet daily.  - morphine 2mg  IV PRN for pain - zofran for nausea - cont protonix for GERD  Diabetes II - diet controlled. 6.2 hgba1c.  - ssi.  HLd - simvastatin  Microcytic anemia - baseline around 10.6. Currently 9.3. May be due to dilution with IVF. Denies any bleeding or stool color changes.  - should follow up outpatient. Didn't want to torture her with another blood draw since she might be leaving today. Should get iron panel outpatient.   DVT/PE ppx: Lovenox 30 mg SQ daily, SCDs   Dispo: Disposition is deferred at this time, awaiting improvement of current medical problems.  Anticipated discharge in approximately 1-2 day(s).   The patient does have a current PCP Karren Cobble, MD) and does need an [facility] hospital follow-up appointment after discharge.  The patient does have transportation limitations that hinder transportation to clinic  appointments.  .Services Needed at time of discharge: Y = Yes, Blank = No PT:   OT:   RN:   Equipment:   Other:     LOS: 2 days   Dellia Nims, MD 11/01/2014, 7:42 AM

## 2014-11-01 NOTE — Progress Notes (Signed)
Hypoglycemic Event  CBG:68  Treatment: 15 GM carbohydrate snack  Symptoms: None  Follow-up CBG: Time:0101 CBG Result:117  Possible Reasons for Event: Inadequate meal intake  Comments/MD notified:DR.NGO    Colleen Medina  Remember to initiate Hypoglycemia Order Set & complete

## 2014-11-01 NOTE — Progress Notes (Signed)
11/01/14 Patient discharged home,IV site removed, and discharge instructions reviewed with patient.

## 2014-11-01 NOTE — Discharge Instructions (Signed)
You were admitted with abdominal pain likely due to constipation. You should make sure you take medicines to help keep your bowel regular. Follow up with your primary care doctor.   Constipation Constipation is when a person has fewer than three bowel movements a week, has difficulty having a bowel movement, or has stools that are dry, hard, or larger than normal. As people grow older, constipation is more common. If you try to fix constipation with medicines that make you have a bowel movement (laxatives), the problem may get worse. Long-term laxative use may cause the muscles of the colon to become weak. A low-fiber diet, not taking in enough fluids, and taking certain medicines may make constipation worse.  CAUSES   Certain medicines, such as antidepressants, pain medicine, iron supplements, antacids, and water pills.   Certain diseases, such as diabetes, irritable bowel syndrome (IBS), thyroid disease, or depression.   Not drinking enough water.   Not eating enough fiber-rich foods.   Stress or travel.   Lack of physical activity or exercise.   Ignoring the urge to have a bowel movement.   Using laxatives too much.  SIGNS AND SYMPTOMS   Having fewer than three bowel movements a week.   Straining to have a bowel movement.   Having stools that are hard, dry, or larger than normal.   Feeling full or bloated.   Pain in the lower abdomen.   Not feeling relief after having a bowel movement.  DIAGNOSIS  Your health care provider will take a medical history and perform a physical exam. Further testing may be done for severe constipation. Some tests may include:  A barium enema X-ray to examine your rectum, colon, and, sometimes, your small intestine.   A sigmoidoscopy to examine your lower colon.   A colonoscopy to examine your entire colon. TREATMENT  Treatment will depend on the severity of your constipation and what is causing it. Some dietary treatments  include drinking more fluids and eating more fiber-rich foods. Lifestyle treatments may include regular exercise. If these diet and lifestyle recommendations do not help, your health care provider may recommend taking over-the-counter laxative medicines to help you have bowel movements. Prescription medicines may be prescribed if over-the-counter medicines do not work.  HOME CARE INSTRUCTIONS   Eat foods that have a lot of fiber, such as fruits, vegetables, whole grains, and beans.  Limit foods high in fat and processed sugars, such as french fries, hamburgers, cookies, candies, and soda.   A fiber supplement may be added to your diet if you cannot get enough fiber from foods.   Drink enough fluids to keep your urine clear or pale yellow.   Exercise regularly or as directed by your health care provider.   Go to the restroom when you have the urge to go. Do not hold it.   Only take over-the-counter or prescription medicines as directed by your health care provider. Do not take other medicines for constipation without talking to your health care provider first.  [place] IF:   You have bright red blood in your stool.   Your constipation lasts for more than 4 days or gets worse.   You have abdominal or rectal pain.   You have thin, pencil-like stools.   You have unexplained weight loss. MAKE SURE YOU:   Understand these instructions.  Will watch your condition.  Will get help right away if you are not doing well or get worse. Document Released: 09/02/2004 Document Revised:  12/10/2013 Document Reviewed: 09/16/2013 ExitCare Patient Information 2015 [place], [place]. This information is not intended to replace advice given to you by your health care provider. Make sure you discuss any questions you have with your health care provider.

## 2014-11-10 ENCOUNTER — Encounter: Payer: Self-pay | Admitting: Pulmonary Disease

## 2014-11-10 ENCOUNTER — Ambulatory Visit (INDEPENDENT_AMBULATORY_CARE_PROVIDER_SITE_OTHER): Payer: Commercial Managed Care - HMO | Admitting: Pulmonary Disease

## 2014-11-10 VITALS — BP 126/74 | HR 65 | Temp 97.6°F | Ht 59.0 in | Wt 120.0 lb

## 2014-11-10 DIAGNOSIS — D509 Iron deficiency anemia, unspecified: Secondary | ICD-10-CM

## 2014-11-10 DIAGNOSIS — E118 Type 2 diabetes mellitus with unspecified complications: Secondary | ICD-10-CM

## 2014-11-10 DIAGNOSIS — K219 Gastro-esophageal reflux disease without esophagitis: Secondary | ICD-10-CM

## 2014-11-10 DIAGNOSIS — K449 Diaphragmatic hernia without obstruction or gangrene: Secondary | ICD-10-CM

## 2014-11-10 DIAGNOSIS — R1084 Generalized abdominal pain: Secondary | ICD-10-CM

## 2014-11-10 DIAGNOSIS — K5900 Constipation, unspecified: Secondary | ICD-10-CM

## 2014-11-10 DIAGNOSIS — K5909 Other constipation: Secondary | ICD-10-CM

## 2014-11-10 DIAGNOSIS — E119 Type 2 diabetes mellitus without complications: Secondary | ICD-10-CM

## 2014-11-10 LAB — CBC WITH DIFFERENTIAL/PLATELET
Basophils Absolute: 0 10*3/uL (ref 0.0–0.1)
Basophils Relative: 0 % (ref 0–1)
EOS ABS: 0.1 10*3/uL (ref 0.0–0.7)
EOS PCT: 2 % (ref 0–5)
HEMATOCRIT: 33 % — AB (ref 36.0–46.0)
HEMOGLOBIN: 10.2 g/dL — AB (ref 12.0–15.0)
LYMPHS PCT: 50 % — AB (ref 12–46)
Lymphs Abs: 2.3 10*3/uL (ref 0.7–4.0)
MCH: 23.6 pg — ABNORMAL LOW (ref 26.0–34.0)
MCHC: 30.9 g/dL (ref 30.0–36.0)
MCV: 76.4 fL — AB (ref 78.0–100.0)
MONOS PCT: 7 % (ref 3–12)
MPV: 8.6 fL — ABNORMAL LOW (ref 9.4–12.4)
Monocytes Absolute: 0.3 10*3/uL (ref 0.1–1.0)
Neutro Abs: 1.9 10*3/uL (ref 1.7–7.7)
Neutrophils Relative %: 41 % — ABNORMAL LOW (ref 43–77)
Platelets: 293 10*3/uL (ref 150–400)
RBC: 4.32 MIL/uL (ref 3.87–5.11)
RDW: 15.3 % (ref 11.5–15.5)
WBC: 4.6 10*3/uL (ref 4.0–10.5)

## 2014-11-10 LAB — IRON AND TIBC
%SAT: 30 % (ref 20–55)
Iron: 90 ug/dL (ref 42–145)
TIBC: 304 ug/dL (ref 250–470)
UIBC: 214 ug/dL (ref 125–400)

## 2014-11-10 NOTE — Progress Notes (Signed)
Internal Medicine Clinic Attending  I saw and evaluated the patient.  I personally confirmed the key portions of the history and exam documented by Dr. Krall and I reviewed pertinent patient test results.  The assessment, diagnosis, and plan were formulated together and I agree with the documentation in the resident's note.  

## 2014-11-10 NOTE — Patient Instructions (Addendum)
Please follow up in 6 months.  General Instructions:   Please bring your medicines with you each time you come to clinic.  Medicines may include prescription medications, over-the-counter medications, herbal remedies, eye drops, vitamins, or other pills.   Progress Toward Treatment Goals:  Treatment Goal 09/11/2014  Hemoglobin A1C at goal  Blood pressure -    Self Care Goals & Plans:  Self Care Goal 05/09/2014  Manage my medications take my medicines as prescribed; refill my medications on time; bring my medications to every visit  Monitor my health bring my glucose meter and log to each visit; keep track of my blood glucose  Eat healthy foods eat foods that are low in salt; eat baked foods instead of fried foods; drink diet soda or water instead of juice or soda  Be physically active find an activity I enjoy  Meeting treatment goals -    Home Blood Glucose Monitoring 09/11/2014  Check my blood sugar no home glucose monitoring  When to check my blood sugar N/A
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
## 2014-11-10 NOTE — Assessment & Plan Note (Addendum)
Assessment: Hgb 11/01/2014 below baseline at 9.3. Normocytic. Previously noted microcytic anemia 2/2 likely thalassemia. May be iron deficiency 2/2 upper GI bleed as she mentioned that she had some coffee ground emesis prior to hospitalization vs 2/2 IV fluids/dilutional or her previously noted thalassemia.  Plan: -Recheck CBC today -Iron panel -Referral made to GI (previously saw Dr. Laurence Spates) for consideration of EGD.  Addendum: CBC with hgb increased from 9.3 to 10.2, close to baseline of 10.6-11. Iron panel wnl.

## 2014-11-10 NOTE — Assessment & Plan Note (Signed)
Assessment: improved. Reports 1 BM per day. Formed and not hard. No loose or watery stools.  Plan: continue Senokot-S BID and Miralax daily prn.

## 2014-11-10 NOTE — Assessment & Plan Note (Signed)
Assessment: She was hospitalized 10/30/2014 to 11/01/2014 for abdominal pain 2/2 constipation. Started on a bowel regimen. Improved overall since hospitalization. Two mild to moderate episodes of abdominal pain since discharge. Noted that she had some coffee ground appearance to emesis prior to hospitalization. Last EGD done in 2012 by [facility] prior to her surgery. Concern for upper GI bleed possibly 2/2 marginal ulcer. Abdominal pain may be 2/2 history of gastroparesis otherwise.  Plan: Referral made for evaluation by GI and consideration of EGD (saw Dr. Laurence Spates previously)

## 2014-11-10 NOTE — Assessment & Plan Note (Signed)
Lab Results  Component Value Date   HGBA1C 6.2 09/11/2014   HGBA1C 6.5 05/09/2014   HGBA1C 6.2 11/29/2013     Assessment: Well controlled on diet  Plan: continue diet control  - Follow up in 6 months for diabetic health maintenance items. Reassess her diabetic control at that time.

## 2014-11-10 NOTE — Assessment & Plan Note (Signed)
Assessment: stable  Plan: continue omeprazole 20mg  daily

## 2014-11-11 LAB — FERRITIN: Ferritin: 118 ng/mL (ref 10–291)

## 2014-11-30 ENCOUNTER — Encounter (HOSPITAL_COMMUNITY): Payer: Self-pay | Admitting: Family Medicine

## 2014-11-30 ENCOUNTER — Emergency Department (HOSPITAL_COMMUNITY): Payer: Commercial Managed Care - HMO

## 2014-11-30 ENCOUNTER — Emergency Department (HOSPITAL_COMMUNITY)
Admission: EM | Admit: 2014-11-30 | Discharge: 2014-11-30 | Disposition: A | Discharge status: Home / Self Care | Payer: Commercial Managed Care - HMO | Attending: Emergency Medicine | Admitting: Emergency Medicine

## 2014-11-30 DIAGNOSIS — K219 Gastro-esophageal reflux disease without esophagitis: Secondary | ICD-10-CM | POA: Insufficient documentation

## 2014-11-30 DIAGNOSIS — Z8679 Personal history of other diseases of the circulatory system: Secondary | ICD-10-CM | POA: Insufficient documentation

## 2014-11-30 DIAGNOSIS — E119 Type 2 diabetes mellitus without complications: Secondary | ICD-10-CM | POA: Diagnosis not present

## 2014-11-30 DIAGNOSIS — Y998 Other external cause status: Secondary | ICD-10-CM | POA: Diagnosis not present

## 2014-11-30 DIAGNOSIS — G8929 Other chronic pain: Secondary | ICD-10-CM | POA: Diagnosis not present

## 2014-11-30 DIAGNOSIS — Z8659 Personal history of other mental and behavioral disorders: Secondary | ICD-10-CM | POA: Insufficient documentation

## 2014-11-30 DIAGNOSIS — J45909 Unspecified asthma, uncomplicated: Secondary | ICD-10-CM | POA: Insufficient documentation

## 2014-11-30 DIAGNOSIS — Y9389 Activity, other specified: Secondary | ICD-10-CM | POA: Insufficient documentation

## 2014-11-30 DIAGNOSIS — E785 Hyperlipidemia, unspecified: Secondary | ICD-10-CM | POA: Diagnosis not present

## 2014-11-30 DIAGNOSIS — K5900 Constipation, unspecified: Secondary | ICD-10-CM | POA: Diagnosis not present

## 2014-11-30 DIAGNOSIS — Z79899 Other long term (current) drug therapy: Secondary | ICD-10-CM | POA: Diagnosis not present

## 2014-11-30 DIAGNOSIS — Y92410 Unspecified street and highway as the place of occurrence of the external cause: Secondary | ICD-10-CM | POA: Diagnosis not present

## 2014-11-30 DIAGNOSIS — E663 Overweight: Secondary | ICD-10-CM | POA: Insufficient documentation

## 2014-11-30 DIAGNOSIS — Z8669 Personal history of other diseases of the nervous system and sense organs: Secondary | ICD-10-CM | POA: Diagnosis not present

## 2014-11-30 DIAGNOSIS — Z862 Personal history of diseases of the blood and blood-forming organs and certain disorders involving the immune mechanism: Secondary | ICD-10-CM | POA: Insufficient documentation

## 2014-11-30 DIAGNOSIS — S8001XA Contusion of right knee, initial encounter: Secondary | ICD-10-CM

## 2014-11-30 DIAGNOSIS — S8991XA Unspecified injury of right lower leg, initial encounter: Secondary | ICD-10-CM | POA: Diagnosis present

## 2014-11-30 DIAGNOSIS — Z8739 Personal history of other diseases of the musculoskeletal system and connective tissue: Secondary | ICD-10-CM | POA: Diagnosis not present

## 2014-11-30 DIAGNOSIS — Z880 Allergy status to penicillin: Secondary | ICD-10-CM | POA: Insufficient documentation

## 2014-11-30 MED ORDER — TRAMADOL HCL 50 MG PO TABS: 50.0000 mg | ORAL_TABLET | Freq: Four times a day (QID) | ORAL | Status: DC | PRN

## 2014-11-30 MED ORDER — OXYCODONE-ACETAMINOPHEN 5-325 MG PO TABS
1.0000 | ORAL_TABLET | Freq: Once | ORAL | Status: AC
  Administered 2014-11-30: 1 via ORAL
  Filled 2014-11-30: qty 1

## 2014-11-30 NOTE — ED Notes (Signed)
Pt took Tramadol this morning without relief.

## 2014-11-30 NOTE — Discharge Instructions (Signed)
Contusion °A contusion is the result of an injury to the skin and underlying tissues and is usually caused by direct trauma. The injury results in the appearance of a bruise on the skin overlying the injured tissues. Contusions cause rupture and bleeding of the small capillaries and blood vessels and affect function, because the bleeding infiltrates muscles, tendons, nerves, or other soft tissues.  °SYMPTOMS  °· Swelling and often a hard lump in the injured area, either superficial or deep. °· Pain and tenderness over the area of the contusion. °· Feeling of firmness when pressure is exerted over the contusion. °· Discoloration under the skin, beginning with redness and progressing to the characteristic "black and blue" bruise. °CAUSES  °A contusion is typically the result of direct trauma. This is often by a blunt object.  °RISK INCREASES WITH: °· Sports that have a high likelihood of trauma (football, boxing, ice hockey, soccer, field hockey, martial arts, basketball, and baseball). °· Sports that make falling from a height likely (high-jumping, pole-vaulting, skating, or gymnastics). °· Any bleeding disorder (hemophilia) or taking medications that affect clotting (aspirin, nonsteroidal anti-inflammatory medications, or warfarin [Coumadin]). °· Inadequate protection of exposed areas during contact sports. °PREVENTION °· Maintain physical fitness: °¨ Joint and muscle flexibility. °¨ Strength and endurance. °¨ Coordination. °· Wear proper protective equipment. Make sure it fits correctly. °PROGNOSIS  °Contusions typically heal without any complications. Healing time varies with the severity of injury and intake of medications that affect clotting. Contusions usually heal in 1 to 4 weeks. °RELATED COMPLICATIONS  °· Damage to nearby nerves or blood vessels, causing numbness, coldness, or paleness. °· Compartment syndrome. °· Bleeding into the soft tissues that leads to disability. °· Infiltrative-type bleeding,  leading to the calcification and impaired function of the injured muscle (rare). °· Prolonged healing time if usual activities are resumed too soon. °· Infection if the skin over the injury site is broken. °· Fracture of the bone underlying the contusion. °· Stiffness in the joint where the injured muscle crosses. °TREATMENT  °Treatment initially consists of resting the injured area as well as medication and ice to reduce inflammation. The use of a compression bandage may also be helpful in minimizing inflammation. As pain diminishes and movement is tolerated, the joint where the affected muscle crosses should be moved to prevent stiffness and the shortening (contracture) of the joint. Movement of the joint should begin as soon as possible. It is also important to work on maintaining strength within the affected muscles. °Occasionally, extra padding over the area of contusion may be recommended before returning to sports, particularly if re-injury is likely.  °MEDICATION  °· If pain relief is necessary these medications are often recommended: °¨ Nonsteroidal anti-inflammatory medications, such as aspirin and ibuprofen. °¨ Other minor pain relievers, such as acetaminophen, are often recommended. °· Prescription pain relievers may be given by your caregiver. Use only as directed and only as much as you need. °HEAT AND COLD °· Cold treatment (icing) relieves pain and reduces inflammation. Cold treatment should be applied for 10 to 15 minutes every 2 to 3 hours for inflammation and pain and immediately after any activity that aggravates your symptoms. Use ice packs or an ice massage. (To do an ice massage fill a large styrofoam cup with water and freeze. Tear a small amount of foam from the top so ice protrudes. Massage ice firmly over the injured area in a circle about the size of a softball.) °· Heat treatment may be used prior to   performing the stretching and strengthening activities prescribed by your caregiver,  physical therapist, or athletic trainer. Use a heat pack or a warm soak. °SEEK MEDICAL CARE IF:  °· Symptoms get worse or do not improve despite treatment in a few days. °· You have difficulty moving a joint. °· Any extremity becomes extremely painful, numb, pale, or cool (This is an emergency!). °· Medication produces any side effects (bleeding, upset stomach, or allergic reaction). °· Signs of infection (drainage from skin, headache, muscle aches, dizziness, fever, or general ill feeling) occur if skin was broken. °Document Released: 12/05/2005 Document Revised: 02/27/2012 Document Reviewed: 03/19/2009 °[employer]® Patient Information ©2015 [employer]. This information is not intended to replace advice given to you by your health care provider. Make sure you discuss any questions you have with your health care provider. ° °
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
## 2014-11-30 NOTE — ED Notes (Signed)
Pt in radiology 

## 2015-01-01 DIAGNOSIS — R1084 Generalized abdominal pain: Secondary | ICD-10-CM | POA: Diagnosis not present

## 2015-01-16 ENCOUNTER — Ambulatory Visit: Payer: Medicare HMO | Admitting: Internal Medicine

## 2015-02-04 ENCOUNTER — Emergency Department (HOSPITAL_COMMUNITY): Payer: Commercial Managed Care - HMO

## 2015-02-04 ENCOUNTER — Emergency Department (HOSPITAL_COMMUNITY)
Admission: EM | Admit: 2015-02-04 | Discharge: 2015-02-04 | Disposition: A | Discharge status: Home / Self Care | Payer: Commercial Managed Care - HMO | Attending: Emergency Medicine | Admitting: Emergency Medicine

## 2015-02-04 ENCOUNTER — Encounter (HOSPITAL_COMMUNITY): Payer: Self-pay | Admitting: *Deleted

## 2015-02-04 DIAGNOSIS — Z8739 Personal history of other diseases of the musculoskeletal system and connective tissue: Secondary | ICD-10-CM | POA: Insufficient documentation

## 2015-02-04 DIAGNOSIS — E785 Hyperlipidemia, unspecified: Secondary | ICD-10-CM | POA: Insufficient documentation

## 2015-02-04 DIAGNOSIS — Z9851 Tubal ligation status: Secondary | ICD-10-CM | POA: Diagnosis not present

## 2015-02-04 DIAGNOSIS — Z86018 Personal history of other benign neoplasm: Secondary | ICD-10-CM | POA: Insufficient documentation

## 2015-02-04 DIAGNOSIS — Z880 Allergy status to penicillin: Secondary | ICD-10-CM | POA: Diagnosis not present

## 2015-02-04 DIAGNOSIS — E119 Type 2 diabetes mellitus without complications: Secondary | ICD-10-CM | POA: Insufficient documentation

## 2015-02-04 DIAGNOSIS — F329 Major depressive disorder, single episode, unspecified: Secondary | ICD-10-CM | POA: Insufficient documentation

## 2015-02-04 DIAGNOSIS — R112 Nausea with vomiting, unspecified: Secondary | ICD-10-CM

## 2015-02-04 DIAGNOSIS — E559 Vitamin D deficiency, unspecified: Secondary | ICD-10-CM | POA: Insufficient documentation

## 2015-02-04 DIAGNOSIS — R109 Unspecified abdominal pain: Secondary | ICD-10-CM | POA: Diagnosis not present

## 2015-02-04 DIAGNOSIS — Z9089 Acquired absence of other organs: Secondary | ICD-10-CM | POA: Insufficient documentation

## 2015-02-04 DIAGNOSIS — K219 Gastro-esophageal reflux disease without esophagitis: Secondary | ICD-10-CM | POA: Diagnosis not present

## 2015-02-04 DIAGNOSIS — Z79899 Other long term (current) drug therapy: Secondary | ICD-10-CM | POA: Diagnosis not present

## 2015-02-04 DIAGNOSIS — Z90710 Acquired absence of both cervix and uterus: Secondary | ICD-10-CM | POA: Insufficient documentation

## 2015-02-04 DIAGNOSIS — R1033 Periumbilical pain: Secondary | ICD-10-CM | POA: Insufficient documentation

## 2015-02-04 DIAGNOSIS — R630 Anorexia: Secondary | ICD-10-CM | POA: Insufficient documentation

## 2015-02-04 LAB — CBC WITH DIFFERENTIAL/PLATELET
BASOS ABS: 0 10*3/uL (ref 0.0–0.1)
BASOS PCT: 0 % (ref 0–1)
Eosinophils Absolute: 0 10*3/uL (ref 0.0–0.7)
Eosinophils Relative: 1 % (ref 0–5)
HCT: 35 % — ABNORMAL LOW (ref 36.0–46.0)
Hemoglobin: 10.7 g/dL — ABNORMAL LOW (ref 12.0–15.0)
Lymphocytes Relative: 31 % (ref 12–46)
Lymphs Abs: 1.9 10*3/uL (ref 0.7–4.0)
MCH: 23.8 pg — ABNORMAL LOW (ref 26.0–34.0)
MCHC: 30.6 g/dL (ref 30.0–36.0)
MCV: 77.8 fL — ABNORMAL LOW (ref 78.0–100.0)
Monocytes Absolute: 0.6 10*3/uL (ref 0.1–1.0)
Monocytes Relative: 9 % (ref 3–12)
NEUTROS PCT: 59 % (ref 43–77)
Neutro Abs: 3.7 10*3/uL (ref 1.7–7.7)
Platelets: 213 10*3/uL (ref 150–400)
RBC: 4.5 MIL/uL (ref 3.87–5.11)
RDW: 14.5 % (ref 11.5–15.5)
WBC: 6.2 10*3/uL (ref 4.0–10.5)

## 2015-02-04 LAB — URINALYSIS, ROUTINE W REFLEX MICROSCOPIC
GLUCOSE, UA: NEGATIVE mg/dL
Ketones, ur: NEGATIVE mg/dL
NITRITE: NEGATIVE
Protein, ur: NEGATIVE mg/dL
Specific Gravity, Urine: 1.029 (ref 1.005–1.030)
Urobilinogen, UA: 1 mg/dL (ref 0.0–1.0)
pH: 5 (ref 5.0–8.0)

## 2015-02-04 LAB — URINE MICROSCOPIC-ADD ON

## 2015-02-04 LAB — COMPREHENSIVE METABOLIC PANEL
ALBUMIN: 3.9 g/dL (ref 3.5–5.2)
ALT: 24 U/L (ref 0–35)
AST: 32 U/L (ref 0–37)
Alkaline Phosphatase: 65 U/L (ref 39–117)
Anion gap: 6 (ref 5–15)
BUN: 18 mg/dL (ref 6–23)
CO2: 30 mmol/L (ref 19–32)
Calcium: 9.2 mg/dL (ref 8.4–10.5)
Chloride: 100 mmol/L (ref 96–112)
Creatinine, Ser: 0.94 mg/dL (ref 0.50–1.10)
GFR calc Af Amer: 69 mL/min — ABNORMAL LOW (ref >90)
GFR calc non Af Amer: 60 mL/min — ABNORMAL LOW (ref >90)
Glucose, Bld: 101 mg/dL — ABNORMAL HIGH (ref 70–99)
POTASSIUM: 3.9 mmol/L (ref 3.5–5.1)
SODIUM: 136 mmol/L (ref 135–145)
Total Bilirubin: 1.5 mg/dL — ABNORMAL HIGH (ref 0.3–1.2)
Total Protein: 7.3 g/dL (ref 6.0–8.3)

## 2015-02-04 LAB — LIPASE, BLOOD: LIPASE: 21 U/L (ref 11–59)

## 2015-02-04 MED ORDER — ONDANSETRON 4 MG PO TBDP
4.0000 mg | ORAL_TABLET | Freq: Once | ORAL | Status: AC
  Administered 2015-02-04: 4 mg via ORAL
  Filled 2015-02-04: qty 1

## 2015-02-04 MED ORDER — ONDANSETRON 8 MG PO TBDP: 8.0000 mg | ORAL_TABLET | Freq: Three times a day (TID) | ORAL | Status: DC | PRN

## 2015-02-04 MED ORDER — HYDROCODONE-ACETAMINOPHEN 5-325 MG PO TABS
1.0000 | ORAL_TABLET | Freq: Once | ORAL | Status: AC
  Administered 2015-02-04: 1 via ORAL
  Filled 2015-02-04: qty 1

## 2015-02-04 MED ORDER — TRAMADOL HCL 50 MG PO TABS
50.0000 mg | ORAL_TABLET | Freq: Once | ORAL | Status: AC
  Administered 2015-02-04: 50 mg via ORAL
  Filled 2015-02-04: qty 1

## 2015-02-04 MED ORDER — ONDANSETRON 4 MG PO TBDP
8.0000 mg | ORAL_TABLET | Freq: Once | ORAL | Status: AC
  Administered 2015-02-04: 8 mg via ORAL

## 2015-02-04 MED ORDER — GI COCKTAIL ~~LOC~~
30.0000 mL | Freq: Once | ORAL | Status: AC
  Administered 2015-02-04: 30 mL via ORAL
  Filled 2015-02-04: qty 30

## 2015-02-04 MED ORDER — ONDANSETRON 4 MG PO TBDP
ORAL_TABLET | ORAL | Status: AC
  Filled 2015-02-04: qty 2
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
## 2015-02-04 NOTE — ED Notes (Signed)
Pts family member approached nurses station requesting to be updated by the doctor regarding patients pain and status. Dr. Kathrynn Humble informed.

## 2015-02-04 NOTE — Discharge Instructions (Signed)
We saw you in the ER for the abdominal pain. All of our results are normal, including all labs and imaging. Kidney function is fine as well. THE X-RAY SHOWS NO PATTERN OF OBSTRUCTION, BUT AS DISCUSSED, THERE COULD BE EARLY OBSTRUCTION OR SMALL OBSTRUCTION THAT IS FAILED TO BE PICKED UP BY THE XRAYS.  We advice you to try clear liquid diet and hydrate well.  If your symptoms get worse, return to the ER. Also, return to the ER if you can't pass gas and the pain is getting worse.   Abdominal Pain Many things can cause abdominal pain. Usually, abdominal pain is not caused by a disease and will improve without treatment. It can often be observed and treated at home. Your health care provider will do a physical exam and possibly order blood tests and X-rays to help determine the seriousness of your pain. However, in many cases, more time must pass before a clear cause of the pain can be found. Before that point, your health care provider may not know if you need more testing or further treatment. HOME CARE INSTRUCTIONS  Monitor your abdominal pain for any changes. The following actions may help to alleviate any discomfort you are experiencing:  Only take over-the-counter or prescription medicines as directed by your health care provider.  Do not take laxatives unless directed to do so by your health care provider.  Try a clear liquid diet (broth, tea, or water) as directed by your health care provider. Slowly move to a bland diet as tolerated. SEEK MEDICAL CARE IF:  You have unexplained abdominal pain.  You have abdominal pain associated with nausea or diarrhea.  You have pain when you urinate or have a bowel movement.  You experience abdominal pain that wakes you in the night.  You have abdominal pain that is worsened or improved by eating food.  You have abdominal pain that is worsened with eating fatty foods.  You have a fever. SEEK IMMEDIATE MEDICAL CARE IF:   Your pain does not go  away within 2 hours.  You keep throwing up (vomiting).  Your pain is felt only in portions of the abdomen, such as the right side or the left lower portion of the abdomen.  You pass bloody or black tarry stools. MAKE SURE YOU:  Understand these instructions.   Will watch your condition.   Will get help right away if you are not doing well or get worse.  Document Released: 09/14/2005 Document Revised: 12/10/2013 Document Reviewed: 08/14/2013 [facility] Patient Information 2015 [place], [place]. This information is not intended to replace advice given to you by your health care provider. Make sure you discuss any questions you have with your health care provider. Clear Liquid Diet A clear liquid diet is a short-term diet that is prescribed to provide the necessary fluid and basic energy you need when you can have nothing else. The clear liquid diet consists of liquids or solids that will become liquid at room temperature. You should be able to see through the liquid. There are many reasons that you may be restricted to clear liquids, such as:  When you have a sudden-onset (acute) condition that occurs before or after surgery.  To help your body slowly get adjusted to food again after a long period when you were unable to have food.  Replacement of fluids when you have a diarrheal disease.  When you are going to have certain exams, such as a colonoscopy, in which instruments are inserted inside your  body to look at parts of your digestive system. WHAT CAN I HAVE? A clear liquid diet does not provide all the nutrients you need. It is important to choose a variety of the following items to get as many nutrients as possible:  Vegetable juices that do not have pulp.  Fruit juices and fruit drinks that do not have pulp.  Coffee (regular or decaffeinated), tea, or soda at the discretion of your health care provider.  Clear bouillon, broth, or strained broth-based soups.  High-protein and  flavored gelatins.  Sugar or honey.  Ices or frozen ice pops that do not contain milk. If you are not sure whether you can have certain items, you should ask your health care provider. You may also ask your health care provider if there are any other clear liquid options. Document Released: 12/05/2005 Document Revised: 12/10/2013 Document Reviewed: 11/01/2013 [facility] Patient Information 2015 [place], [place]. This information is not intended to replace advice given to you by your health care provider. Make sure you discuss any questions you have with your health care provider. Nausea and Vomiting Nausea is a sick feeling that often comes before throwing up (vomiting). Vomiting is a reflex where stomach contents come out of your mouth. Vomiting can cause severe loss of body fluids (dehydration). Children and elderly adults can become dehydrated quickly, especially if they also have diarrhea. Nausea and vomiting are symptoms of a condition or disease. It is important to find the cause of your symptoms. CAUSES   Direct irritation of the stomach lining. This irritation can result from increased acid production (gastroesophageal reflux disease), infection, food poisoning, taking certain medicines (such as nonsteroidal anti-inflammatory drugs), alcohol use, or tobacco use.  Signals from the brain.These signals could be caused by a headache, heat exposure, an inner ear disturbance, increased pressure in the brain from injury, infection, a tumor, or a concussion, pain, emotional stimulus, or metabolic problems.  An obstruction in the gastrointestinal tract (bowel obstruction).  Illnesses such as diabetes, hepatitis, gallbladder problems, appendicitis, kidney problems, cancer, sepsis, atypical symptoms of a heart attack, or eating disorders.  Medical treatments such as chemotherapy and radiation.  Receiving medicine that makes you sleep (general anesthetic) during surgery. DIAGNOSIS Your caregiver  may ask for tests to be done if the problems do not improve after a few days. Tests may also be done if symptoms are severe or if the reason for the nausea and vomiting is not clear. Tests may include:  Urine tests.  Blood tests.  Stool tests.  Cultures (to look for evidence of infection).  X-rays or other imaging studies. Test results can help your caregiver make decisions about treatment or the need for additional tests. TREATMENT You need to stay well hydrated. Drink frequently but in small amounts.You may wish to drink water, sports drinks, clear broth, or eat frozen ice pops or gelatin dessert to help stay hydrated.When you eat, eating slowly may help prevent nausea.There are also some antinausea medicines that may help prevent nausea. HOME CARE INSTRUCTIONS   Take all medicine as directed by your caregiver.  If you do not have an appetite, do not force yourself to eat. However, you must continue to drink fluids.  If you have an appetite, eat a normal diet unless your caregiver tells you differently.  Eat a variety of complex carbohydrates (rice, wheat, potatoes, bread), lean meats, yogurt, fruits, and vegetables.  Avoid high-fat foods because they are more difficult to digest.  Drink enough water and fluids to keep  your urine clear or pale yellow.  If you are dehydrated, ask your caregiver for specific rehydration instructions. Signs of dehydration may include:  Severe thirst.  Dry lips and mouth.  Dizziness.  Dark urine.  Decreasing urine frequency and amount.  Confusion.  Rapid breathing or pulse. SEEK IMMEDIATE MEDICAL CARE IF:   You have blood or brown flecks (like coffee grounds) in your vomit.  You have black or bloody stools.  You have a severe headache or stiff neck.  You are confused.  You have severe abdominal pain.  You have chest pain or trouble breathing.  You do not urinate at least once every 8 hours.  You develop cold or clammy  skin.  You continue to vomit for longer than 24 to 48 hours.  You have a fever. MAKE SURE YOU:   Understand these instructions.  Will watch your condition.  Will get help right away if you are not doing well or get worse. Document Released: 12/05/2005 Document Revised: 02/27/2012 Document Reviewed: 05/04/2011 [facility] Patient Information 2015 [place], [place]. This information is not intended to replace advice given to you by your health care provider. Make sure you discuss any questions you have with your health care provider.

## 2015-02-04 NOTE — ED Notes (Signed)
Pt reports her daughter will be driving her home 

## 2015-02-04 NOTE — ED Notes (Signed)
Pt given cup of ginger ale per fluid challenge.

## 2015-02-04 NOTE — ED Notes (Signed)
Pt requesting pain medication. Dr. Kathrynn Humble informed.

## 2015-02-04 NOTE — ED Notes (Signed)
Pt states she has been drinking the ginger ale and has not gotten nauseous.

## 2015-02-04 NOTE — ED Notes (Signed)
Pt reports generalized abd pain and n/v x 2 days. Denies urinary symptoms, denies diarrhea.

## 2015-02-04 NOTE — ED Notes (Signed)
Dr. Nanavati at bedside 

## 2015-02-09 DIAGNOSIS — E119 Type 2 diabetes mellitus without complications: Secondary | ICD-10-CM | POA: Diagnosis not present

## 2015-02-09 DIAGNOSIS — R1013 Epigastric pain: Secondary | ICD-10-CM | POA: Diagnosis not present

## 2015-02-09 DIAGNOSIS — K3184 Gastroparesis: Secondary | ICD-10-CM | POA: Diagnosis not present

## 2015-02-18 DIAGNOSIS — R1013 Epigastric pain: Secondary | ICD-10-CM | POA: Diagnosis not present

## 2015-02-18 DIAGNOSIS — K449 Diaphragmatic hernia without obstruction or gangrene: Secondary | ICD-10-CM | POA: Diagnosis not present

## 2015-02-18 DIAGNOSIS — T182XXA Foreign body in stomach, initial encounter: Secondary | ICD-10-CM | POA: Diagnosis not present

## 2015-02-18 DIAGNOSIS — K3189 Other diseases of stomach and duodenum: Secondary | ICD-10-CM | POA: Diagnosis not present

## 2015-02-18 DIAGNOSIS — Z980 Intestinal bypass and anastomosis status: Secondary | ICD-10-CM | POA: Diagnosis not present

## 2015-02-19 ENCOUNTER — Other Ambulatory Visit: Payer: Self-pay | Admitting: Internal Medicine

## 2015-02-19 DIAGNOSIS — R1013 Epigastric pain: Secondary | ICD-10-CM

## 2015-02-19 MED ORDER — HYOSCYAMINE SULFATE 0.125 MG SL SUBL
0.2500 mg | SUBLINGUAL_TABLET | Freq: Four times a day (QID) | SUBLINGUAL | Status: DC | PRN
Start: 2015-02-19 — End: 2015-12-01

## 2015-03-17 ENCOUNTER — Encounter: Payer: Self-pay | Admitting: Internal Medicine

## 2015-03-27 IMAGING — CR DG ABDOMEN 2V
2 series · 2 of 2 positions shown · non-contrast
Comparison: Abdominal pelvic CT 08/15/2012. Acute abdominal series
done 08/20/2011.

CLINICAL DATA: Abdominal pain with nausea, vomiting and diarrhea
for 4 days. History of diabetes. Initial encounter.

EXAM:
ABDOMEN - 2 VIEW

[w abdomen upright]
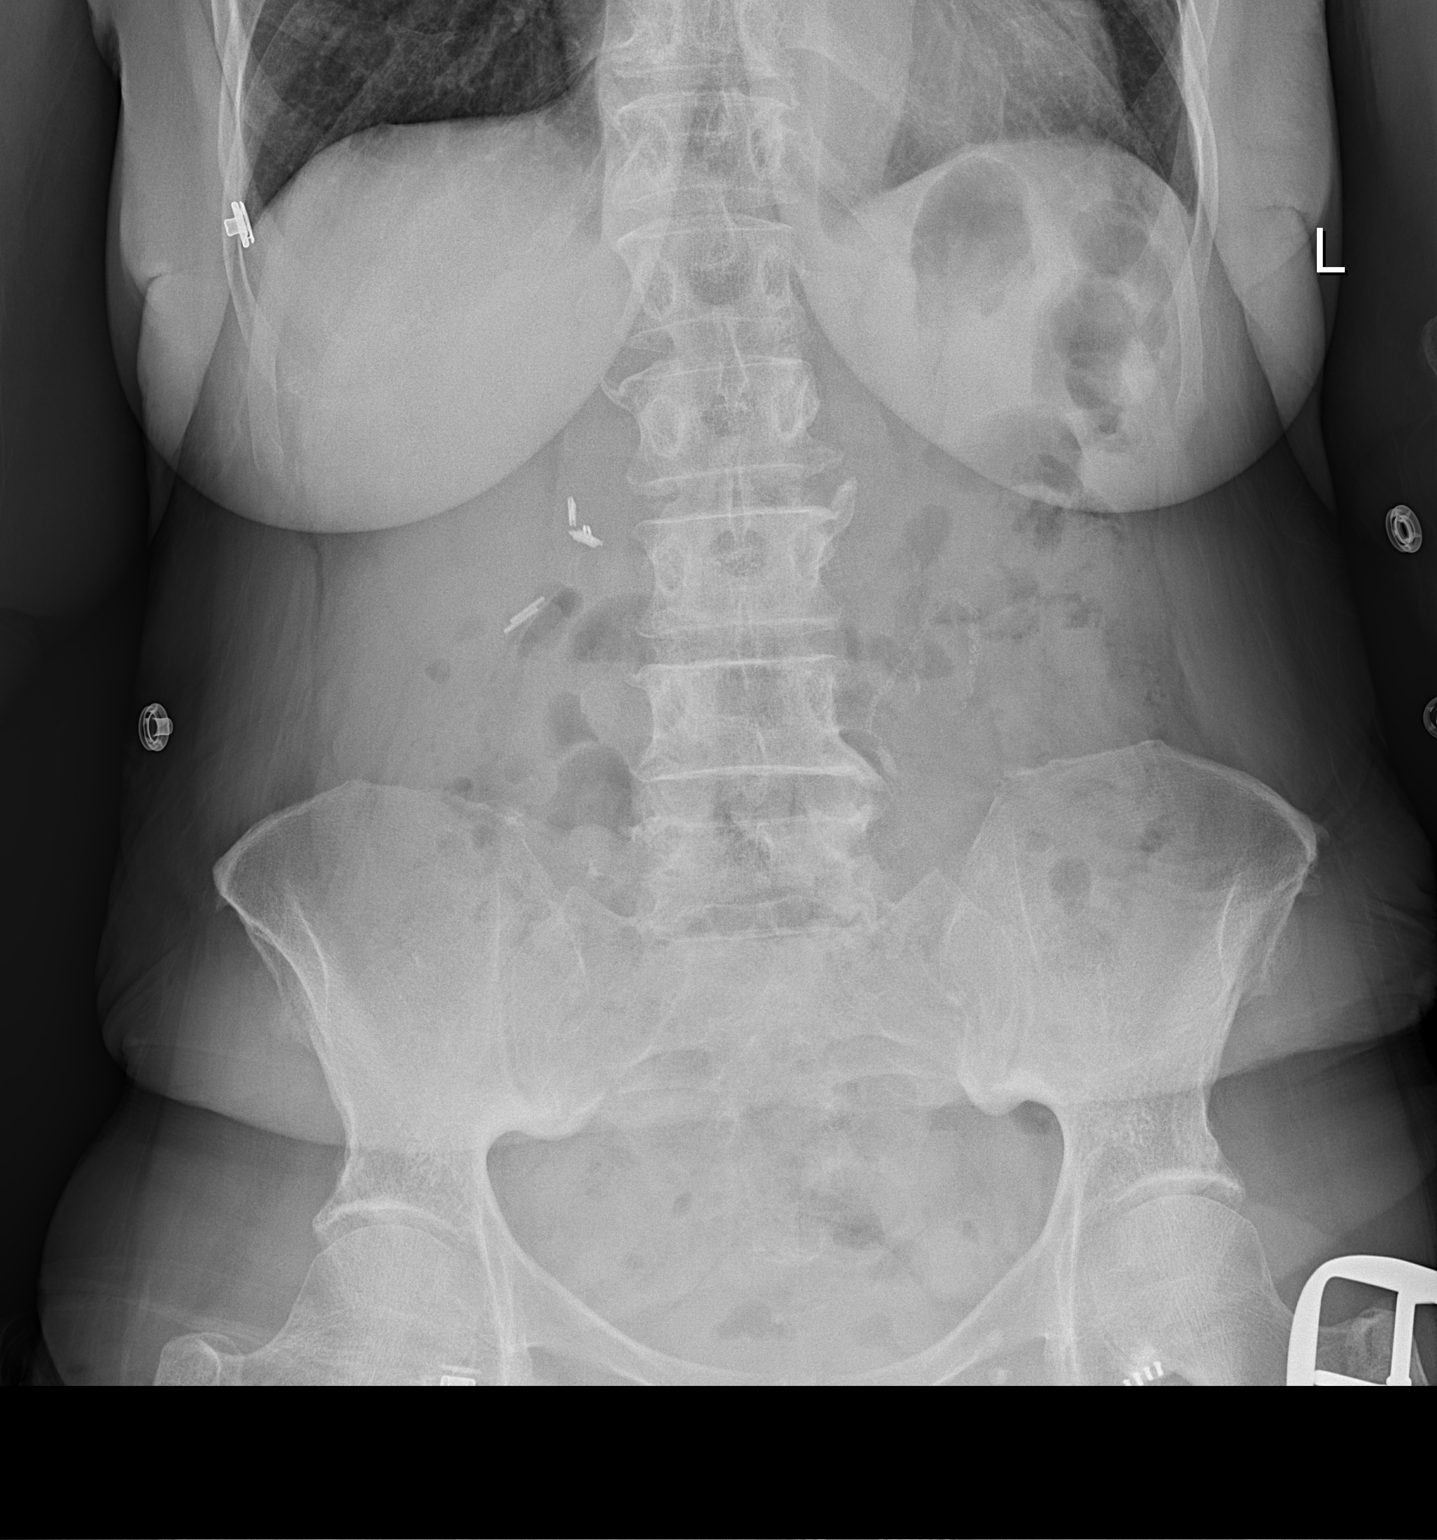

[t abdomen supine]
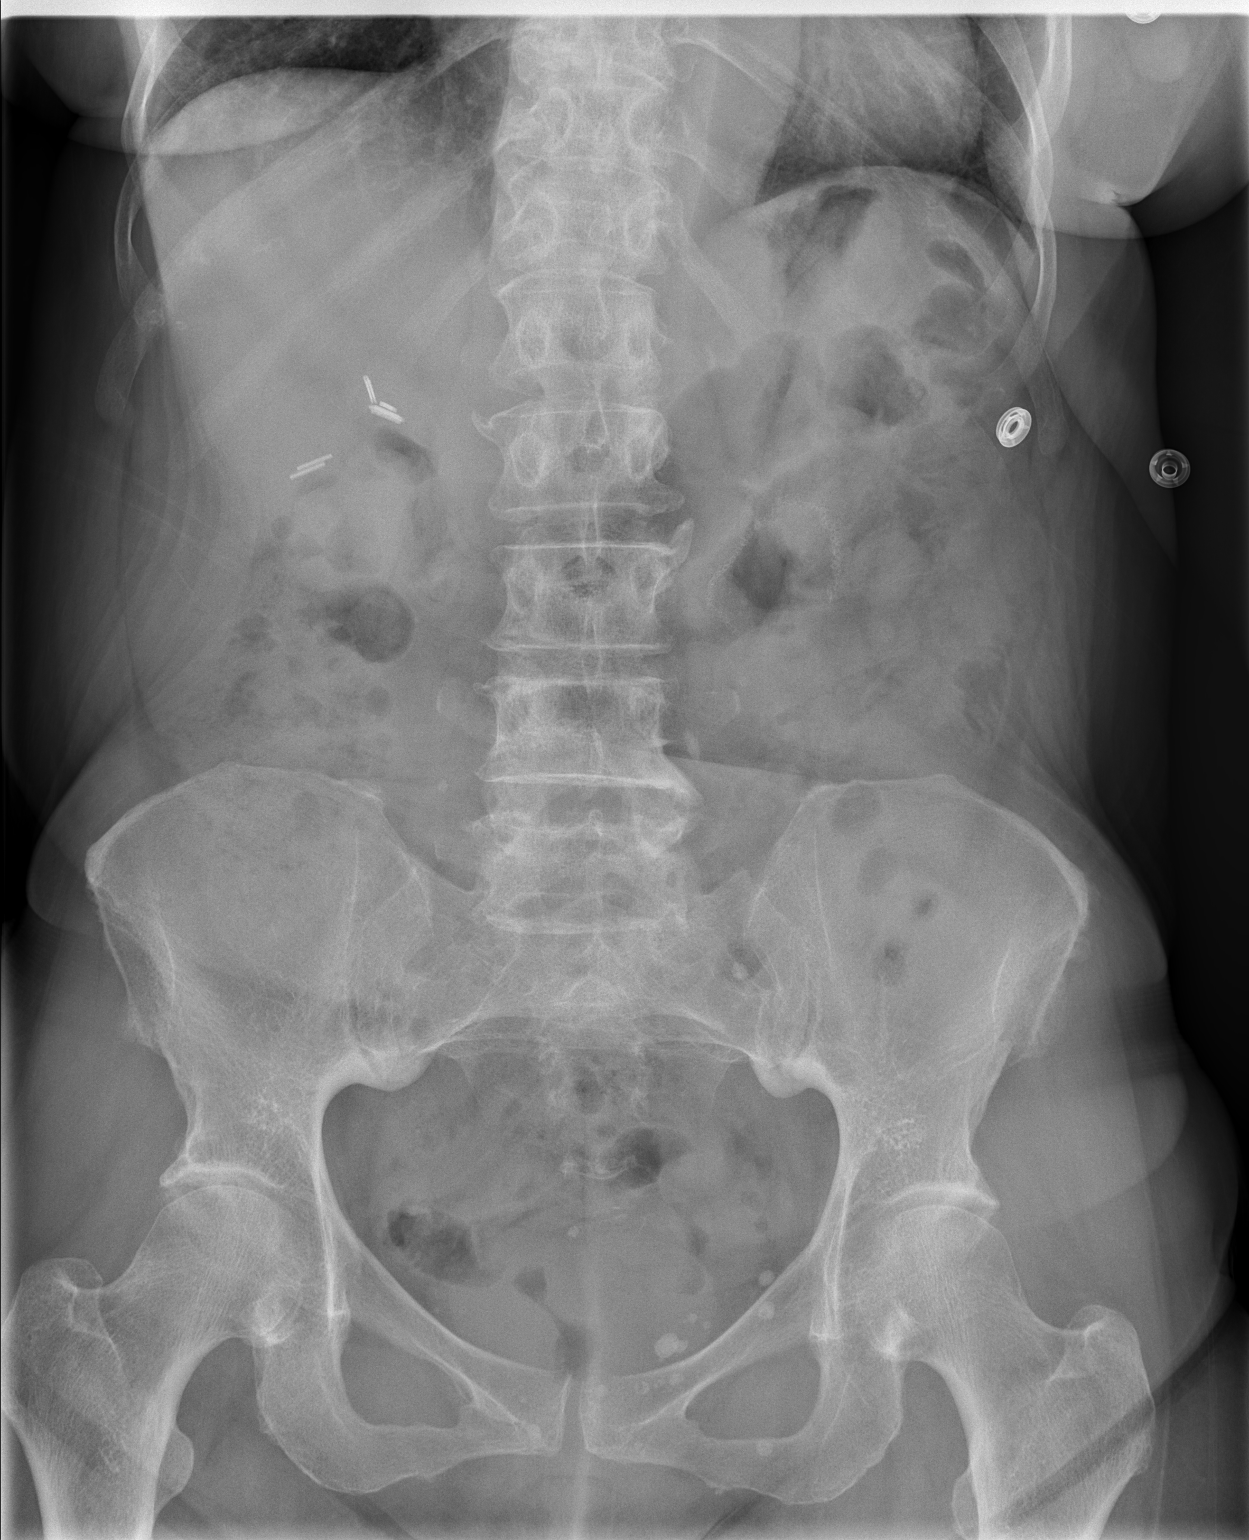

[2 of 2 positions shown; findings below may reference images not displayed]

FINDINGS: Bowel anastomosis and other surgical clips are noted. The bowel gas
pattern is normal. There is no free intraperitoneal air. Pelvic
calcifications typical of phleboliths are grossly stable. There are
stable mild degenerative changes within the spine.
IMPRESSION: No acute abdominal findings.  Postsurgical changes as described.

## 2015-05-13 ENCOUNTER — Telehealth: Payer: Self-pay | Admitting: *Deleted

## 2015-05-13 ENCOUNTER — Ambulatory Visit (INDEPENDENT_AMBULATORY_CARE_PROVIDER_SITE_OTHER): Payer: Commercial Managed Care - HMO | Admitting: Internal Medicine

## 2015-05-13 ENCOUNTER — Encounter: Payer: Self-pay | Admitting: Internal Medicine

## 2015-05-13 VITALS — BP 166/86 | HR 56 | Temp 98.4°F | Wt 120.2 lb

## 2015-05-13 DIAGNOSIS — F32A Depression, unspecified: Secondary | ICD-10-CM

## 2015-05-13 DIAGNOSIS — F329 Major depressive disorder, single episode, unspecified: Secondary | ICD-10-CM | POA: Diagnosis not present

## 2015-05-13 DIAGNOSIS — R079 Chest pain, unspecified: Secondary | ICD-10-CM

## 2015-05-13 LAB — TROPONIN I: Troponin I: <0.03 ng/mL (ref <0.031)

## 2015-05-13 MED ORDER — NITROGLYCERIN 0.3 MG SL SUBL: 0.3000 mg | SUBLINGUAL_TABLET | SUBLINGUAL | Status: DC | PRN

## 2015-05-13 MED ORDER — SERTRALINE HCL 100 MG PO TABS: ORAL_TABLET | ORAL | Status: DC

## 2015-05-13 NOTE — Telephone Encounter (Signed)
Called pt - got voice mail  Admitted CP Oct 2015 and had poor quality myoview so got coronary CT and had score of 0. Reassuring but CP is worrisome and BP too high (most recent had good control). Therefore, I rec that she come this PM for [facility] appt.

## 2015-05-13 NOTE — Patient Instructions (Signed)
Start taking nitroglycerin as directed for chest pain.   Start taking Zoloft 50mg  (1/2 tab) daily for 1 week, then start taking 100mg  (1 tab) daily.   Sertraline tablets What is this medicine? SERTRALINE (SER tra leen) is used to treat depression. It may also be used to treat obsessive compulsive disorder, panic disorder, post-trauma stress, premenstrual dysphoric disorder (PMDD) or social anxiety. This medicine may be used for other purposes; ask your health care provider or pharmacist if you have questions. COMMON BRAND NAME(S): Zoloft What should I tell my health care provider before I take this medicine? They need to know if you have any of these conditions: -bipolar disorder or a family history of bipolar disorder -diabetes -glaucoma -heart disease -high blood pressure -history of irregular heartbeat -history of low levels of calcium, magnesium, or potassium in the blood -if you often drink alcohol -liver disease -receiving electroconvulsive therapy -seizures -suicidal thoughts, plans, or attempt; a previous suicide attempt by you or a family member -thyroid disease -an unusual or allergic reaction to sertraline, other medicines, foods, dyes, or preservatives -pregnant or trying to get pregnant -breast-feeding How should I use this medicine? Take this medicine by mouth with a glass of water. Follow the directions on the prescription label. You can take it with or without food. Take your medicine at regular intervals. Do not take your medicine more often than directed. Do not stop taking this medicine suddenly except upon the advice of your doctor. Stopping this medicine too quickly may cause serious side effects or your condition may worsen. A special MedGuide will be given to you by the pharmacist with each prescription and refill. Be sure to read this information carefully each time. Talk to your pediatrician regarding the use of this medicine in children. While this drug may be  prescribed for children as young as 7 years for selected conditions, precautions do apply. Overdosage: If you think you have taken too much of this medicine contact a poison control center or emergency room at once. NOTE: This medicine is only for you. Do not share this medicine with others. What if I miss a dose? If you miss a dose, take it as soon as you can. If it is almost time for your next dose, take only that dose. Do not take double or extra doses. What may interact with this medicine? Do not take this medicine with any of the following medications: -certain medicines for fungal infections like fluconazole, itraconazole, ketoconazole, posaconazole, voriconazole -cisapride -disulfiram -dofetilide -linezolid -MAOIs like Carbex, Eldepryl, Marplan, Nardil, and Parnate -metronidazole -methylene blue (injected into a vein) -pimozide -thioridazine -ziprasidone This medicine may also interact with the following medications: -alcohol -aspirin and aspirin-like medicines -certain medicines for depression, anxiety, or psychotic disturbances -certain medicines for irregular heart beat like flecainide, propafenone -certain medicines for migraine headaches like almotriptan, eletriptan, frovatriptan, naratriptan, rizatriptan, sumatriptan, zolmitriptan -certain medicines for sleep -certain medicines for seizures like carbamazepine, valproic acid, phenytoin -certain medicines that treat or prevent blood clots like warfarin, enoxaparin, dalteparin -cimetidine -digoxin -diuretics -fentanyl -furazolidone -isoniazid -lithium -NSAIDs, medicines for pain and inflammation, like ibuprofen or naproxen -other medicines that prolong the QT interval (cause an abnormal heart rhythm) -procarbazine -rasagiline -supplements like St. John's wort, kava kava, valerian -tolbutamide -tramadol -tryptophan This list may not describe all possible interactions. Give your health care provider a list of all the  medicines, herbs, non-prescription drugs, or dietary supplements you use. Also tell them if you smoke, drink alcohol, or use illegal drugs. Some  items may interact with your medicine. What should I watch for while using this medicine? Tell your doctor if your symptoms do not get better or if they get worse. Visit your doctor or health care professional for regular checks on your progress. Because it may take several weeks to see the full effects of this medicine, it is important to continue your treatment as prescribed by your doctor. Patients and their families should watch out for new or worsening thoughts of suicide or depression. Also watch out for sudden changes in feelings such as feeling anxious, agitated, panicky, irritable, hostile, aggressive, impulsive, severely restless, overly excited and hyperactive, or not being able to sleep. If this happens, especially at the beginning of treatment or after a change in dose, call your health care professional. Dennis Bast may get drowsy or dizzy. Do not drive, use machinery, or do anything that needs mental alertness until you know how this medicine affects you. Do not stand or sit up quickly, especially if you are an older patient. This reduces the risk of dizzy or fainting spells. Alcohol may interfere with the effect of this medicine. Avoid alcoholic drinks. Your mouth may get dry. Chewing sugarless gum or sucking hard candy, and drinking plenty of water may help. Contact your doctor if the problem does not go away or is severe. What side effects may I notice from receiving this medicine? Side effects that you should report to your doctor or health care professional as soon as possible: -allergic reactions like skin rash, itching or hives, swelling of the face, lips, or tongue -black or bloody stools, blood in the urine or vomit -fast, irregular heartbeat -feeling faint or lightheaded, falls -hallucination, loss of contact with reality -seizures -suicidal  thoughts or other mood changes -unusual bleeding or bruising -unusually weak or tired -vomiting Side effects that usually do not require medical attention (report to your doctor or health care professional if they continue or are bothersome): -change in appetite -change in sex drive or performance -diarrhea -increased sweating -indigestion, nausea -tremors This list may not describe all possible side effects. Call your doctor for medical advice about side effects. You may report side effects to FDA at 1-800-FDA-1088. Where should I keep my medicine? Keep out of the reach of children. Store at room temperature between 15 and 30 degrees C (59 and 86 degrees F). Throw away any unused medicine after the expiration date. NOTE: This sheet is a summary. It may not cover all possible information. If you have questions about this medicine, talk to your doctor, pharmacist, or health care provider.  2015, [employer]vier/[employer] Standard. (2013-07-02 12:57:35)  Nitroglycerin sublingual tablets What is this medicine? NITROGLYCERIN (nye troe GLI ser in) is a type of vasodilator. It relaxes blood vessels, increasing the blood and oxygen supply to your heart. This medicine is used to relieve chest pain caused by angina. It is also used to prevent chest pain before activities like climbing stairs, going outdoors in cold weather, or sexual activity. This medicine may be used for other purposes; ask your health care provider or pharmacist if you have questions. COMMON BRAND NAME(S): Nitroquick, Nitrostat, Nitrotab What should I tell my health care provider before I take this medicine? They need to know if you have any of these conditions: -anemia -head injury, recent stroke, or bleeding in the brain -liver disease -previous heart attack -an unusual or allergic reaction to nitroglycerin, other medicines, foods, dyes, or preservatives -pregnant or trying to get pregnant -breast-feeding How should I use  this  medicine? Take this medicine by mouth as needed. At the first sign of an angina attack (chest pain or tightness) place one tablet under your tongue. You can also take this medicine 5 to 10 minutes before an event likely to produce chest pain. Follow the directions on the prescription label. Let the tablet dissolve under the tongue. Do not swallow whole. Replace the dose if you accidentally swallow it. It will help if your mouth is not dry. Saliva around the tablet will help it to dissolve more quickly. Do not eat or drink, smoke or chew tobacco while a tablet is dissolving. If you are not better within 5 minutes after taking ONE dose of nitroglycerin, call 9-1-1 immediately to seek emergency medical care. Do not take more than 3 nitroglycerin tablets over 15 minutes. If you take this medicine often to relieve symptoms of angina, your doctor or health care professional may provide you with different instructions to manage your symptoms. If symptoms do not go away after following these instructions, it is important to call 9-1-1 immediately. Do not take more than 3 nitroglycerin tablets over 15 minutes. Talk to your pediatrician regarding the use of this medicine in children. Special care may be needed. Overdosage: If you think you have taken too much of this medicine contact a poison control center or emergency room at once. NOTE: This medicine is only for you. Do not share this medicine with others. What if I miss a dose? This does not apply. This medicine is only used as needed. What may interact with this medicine? Do not take this medicine with any of the following medications: -certain migraine medicines like ergotamine and dihydroergotamine (DHE) -medicines used to treat erectile dysfunction like sildenafil, tadalafil, and vardenafil -riociguat This medicine may also interact with the following medications: -alteplase -aspirin -heparin -medicines for high blood pressure -medicines for mental  depression -other medicines used to treat angina -phenothiazines like chlorpromazine, mesoridazine, prochlorperazine, thioridazine This list may not describe all possible interactions. Give your health care provider a list of all the medicines, herbs, non-prescription drugs, or dietary supplements you use. Also tell them if you smoke, drink alcohol, or use illegal drugs. Some items may interact with your medicine. What should I watch for while using this medicine? Tell your doctor or health care professional if you feel your medicine is no longer working. Keep this medicine with you at all times. Sit or lie down when you take your medicine to prevent falling if you feel dizzy or faint after using it. Try to remain calm. This will help you to feel better faster. If you feel dizzy, take several deep breaths and lie down with your feet propped up, or bend forward with your head resting between your knees. You may get drowsy or dizzy. Do not drive, use machinery, or do anything that needs mental alertness until you know how this drug affects you. Do not stand or sit up quickly, especially if you are an older patient. This reduces the risk of dizzy or fainting spells. Alcohol can make you more drowsy and dizzy. Avoid alcoholic drinks. Do not treat yourself for coughs, colds, or pain while you are taking this medicine without asking your doctor or health care professional for advice. Some ingredients may increase your blood pressure. What side effects may I notice from receiving this medicine? Side effects that you should report to your doctor or health care professional as soon as possible: -blurred vision -dry mouth -skin rash -sweating -the  feeling of extreme pressure in the head -unusually weak or tired Side effects that usually do not require medical attention (report to your doctor or health care professional if they continue or are bothersome): -flushing of the face or neck -headache -irregular  heartbeat, palpitations -nausea, vomiting This list may not describe all possible side effects. Call your doctor for medical advice about side effects. You may report side effects to FDA at 1-800-FDA-1088. Where should I keep my medicine? Keep out of the reach of children. Store at room temperature between 20 and 25 degrees C (68 and 77 degrees F). Store in [employer]. Protect from light and moisture. Keep tightly closed. Throw away any unused medicine after the expiration date. NOTE: This sheet is a summary. It may not cover all possible information. If you have questions about this medicine, talk to your doctor, pharmacist, or health care provider.  2015, [employer] Standard. (2013-10-03 17:57:36)

## 2015-05-13 NOTE — Telephone Encounter (Signed)
Will come to clinic now.

## 2015-05-13 NOTE — Telephone Encounter (Signed)
Pt called to report that she has been having headaches and high BP .  She got a reading of 180/? Yesterday.   Then she mentions that yesterday she chest pain while at work.  A pressure felling to chest  with radiation to back. She took 5 aspirin with relief.   Then again last night chest pain returned lasting about 1 hour .She is not sure about SOB, some nausea but no diaphoresis. Reports BP 163/77 Rates pain 10/10 when present to area under rt breast.  Some dizziness.   Pt denies any cardiac problems.  Please advise Pt # [id number]

## 2015-05-14 DIAGNOSIS — R079 Chest pain, unspecified: Secondary | ICD-10-CM | POA: Insufficient documentation

## 2015-05-14 NOTE — Assessment & Plan Note (Addendum)
Pt presents for chest pain that started yesterday while she was at work. She was walking and noticed chest pain that she rated as 10/10 which localized beneath rt breast and radiated to her back. Pain is squeezing and pressure like in nature. She had rt hand weakness associated with pain. She took 5 81 mg ASA and chest pain resolved in 45 minutes. She then went home and while burning papers in a fire pit noticed the same chest pain again. She went sit in her car and chest pain went away on its own after 1 hour. She denies SOB, diaphoresis, or chest palpitations associated w/ chest pain. She is on prilosec for GERD and states sx have been well controlled. She has been under increasing stress lately due to caring for her husband who has progressing dementia as well as her 5 grandchildren. On exam there was less than 19mmHG difference btwn b/l arm pressures. She has never smoked before and does not drink alcohol. Denies heavy lifting or exerting herself more than usual. She does endorse a sore throat and feeling like she has a lump in her throat. Globus sensation is located mid esophagus and sometime will travel deeper into her chest. EKG today neg for significant changes from prior EKGs, troponin negative. She had a neg CT of her coronary arteries w/ calcium score of 0 and ECHO w/ EF of 54-09% grade 1 diastolic dysfunction both in 09/2014. DDx include anxiety and esophageal spasms. Unlikely angina due to neg CT of coronary arteries, nor aortic dissection as BP difference btwn arms was not greater than 24mmHG. Could be GI related due to increased stress and hx of GERD. Due to globus sensation and angina like symptoms will tx for presumed esophageal spasms. Will also tx for anxiety as it may also be playing a role in chest pain w/ pt's increasing stress and hospital admissions w/ neg workups.    - ordered MBS - rx for sublingual nitroglycerin for chest pain - rx for zoloft 50mg  daily x 1 week, then 100mg  daily -  f/u in 3 months w/ PCP Dr. Eppie Gibson

## 2015-05-14 NOTE — Progress Notes (Signed)
   Subjective:    Patient ID: GRACELIN WEISBERG, female    DOB: September 15, 1941, 74 y.o.   MRN: [id number]  HPI Pt is a 74 y/o female w/ PMHx of seasonal allergies, OA, chronic tension HAs, DM, and HLD who presents for an acute visit for chest pain. Please see problem list for further details.      Review of Systems  Constitutional: Negative for diaphoresis.  HENT: Positive for sore throat and trouble swallowing.   Respiratory: Negative for shortness of breath.   Cardiovascular: Positive for chest pain (denies current chest pain).  Musculoskeletal: Positive for back pain.  Neurological: Positive for weakness (rt hand weakness associated w/ chest pain) and headaches. Negative for dizziness.  Psychiatric/Behavioral:       Increased stress        Objective:   Physical Exam  Constitutional: She appears well-nourished. No distress.  HENT:  Head: Normocephalic.  Cardiovascular: Normal rate and regular rhythm.   No murmur heard. Pulmonary/Chest: Effort normal and breath sounds normal. She has no wheezes. She exhibits no tenderness.  Abdominal: Soft. Bowel sounds are normal.  Skin: Skin is warm and dry. She is not diaphoretic.          Assessment & Plan:  Please see problem based assessment and plan.

## 2015-05-14 NOTE — Assessment & Plan Note (Signed)
Pt has increased stress due to caring for husband with worsening dementia. Will try zoloft 50mg  x 1 week then 100mg  daily. F/u in 3 months with PCP. If zoloft is not effective can try wellbutrin.

## 2015-05-15 ENCOUNTER — Other Ambulatory Visit (HOSPITAL_COMMUNITY): Payer: Self-pay | Admitting: Internal Medicine

## 2015-05-15 DIAGNOSIS — R1314 Dysphagia, pharyngoesophageal phase: Secondary | ICD-10-CM

## 2015-05-15 NOTE — Progress Notes (Signed)
I saw and evaluated the patient.  I personally confirmed the key portions of [name] history and exam and reviewed pertinent patient test results.  The assessment, diagnosis, and plan were formulated together and I agree with the documentation in the resident's note.  I know Colleen Medina as I am her PCP.  Similar pain has responded well to SL NTG prior to her previous cardiac work-up.  This also suggests the possibility of esophageal spasm.  I therefore agree with starting SL NTG for possible esophageal spasm and obtaining a barium swallow to look for esophageal dysmotility.  She also has anhedonia and other symptoms consistent with major depression and will be started on Zoloft with reassessment of her symptoms at the return visit.

## 2015-05-21 ENCOUNTER — Encounter: Payer: Self-pay | Admitting: Gastroenterology

## 2015-05-27 ENCOUNTER — Ambulatory Visit (HOSPITAL_COMMUNITY)
Admission: RE | Admit: 2015-05-27 | Discharge: 2015-05-27 | Disposition: A | Discharge status: Home / Self Care | Payer: Commercial Managed Care - HMO | Source: Ambulatory Visit | Attending: Internal Medicine | Admitting: Internal Medicine

## 2015-05-27 ENCOUNTER — Telehealth: Payer: Self-pay | Admitting: *Deleted

## 2015-05-27 DIAGNOSIS — K219 Gastro-esophageal reflux disease without esophagitis: Secondary | ICD-10-CM | POA: Diagnosis not present

## 2015-05-27 DIAGNOSIS — R1314 Dysphagia, pharyngoesophageal phase: Secondary | ICD-10-CM | POA: Diagnosis not present

## 2015-05-27 DIAGNOSIS — R079 Chest pain, unspecified: Secondary | ICD-10-CM

## 2015-05-27 DIAGNOSIS — K449 Diaphragmatic hernia without obstruction or gangrene: Secondary | ICD-10-CM | POA: Diagnosis not present

## 2015-05-27 DIAGNOSIS — K224 Dyskinesia of esophagus: Secondary | ICD-10-CM

## 2015-05-27 NOTE — Telephone Encounter (Signed)
Received call from Mickel Baas (pg# [phone])-[facility] had reviewed pt's record and wanted to confirm the test type that was ordered on pt.  Test ordered was a Modified Barium Swallow that is conducted by a speech language pathologist under fluoroscopy-usually done to assess a pt's aspiration risk.  A Barium swallow (DG Esophagus) is conducted by a radiologist under fluoroscopy to assess and diagnose the esophageal phase of swallowing.  Examples include ruling out stricture, dysmotility, hiatal hernia, reflux, etc. Spoke with attending MD-ordering MD is currently unavailable.  Per 05/13/15 office visit -MD was obtaining a barium swallow to look for esophageal dysmotility, therefore it was decided to change to the order to Barium Swallow (DG Esophagus). New order placed for DG Esophagus and sent to attending for signature.   Phone call complete.Regenia Skeeter, Darlene Cassady6/8/20169:54 AM

## 2015-06-23 DIAGNOSIS — M899 Disorder of bone, unspecified: Secondary | ICD-10-CM | POA: Diagnosis not present

## 2015-06-23 DIAGNOSIS — Z1231 Encounter for screening mammogram for malignant neoplasm of breast: Secondary | ICD-10-CM | POA: Diagnosis not present

## 2015-06-29 ENCOUNTER — Telehealth: Payer: Self-pay | Admitting: Internal Medicine

## 2015-06-29 NOTE — Telephone Encounter (Signed)
Pt called asking for the results of DG Esophagus from 6/8 Please call results to pt @ [phone] (H)

## 2015-06-29 NOTE — Telephone Encounter (Signed)
Patient calling asking about results from the barium swallow she did last month

## 2015-06-30 ENCOUNTER — Other Ambulatory Visit: Payer: Self-pay | Admitting: Internal Medicine

## 2015-06-30 DIAGNOSIS — K449 Diaphragmatic hernia without obstruction or gangrene: Principal | ICD-10-CM

## 2015-06-30 DIAGNOSIS — K219 Gastro-esophageal reflux disease without esophagitis: Secondary | ICD-10-CM

## 2015-06-30 MED ORDER — OMEPRAZOLE 40 MG PO CPDR: 40.0000 mg | DELAYED_RELEASE_CAPSULE | Freq: Every day | ORAL | Status: DC

## 2015-07-01 ENCOUNTER — Telehealth: Payer: Self-pay | Admitting: *Deleted

## 2015-07-01 NOTE — Telephone Encounter (Signed)
Referral passed on to [place]

## 2015-07-01 NOTE — Telephone Encounter (Signed)
CALLED PATIENT LVM FOR PATIENT TO CALL [facility]/ NEEDING TO KNOW IF SHE HAS SEEN A GI DR. IN THE PAST/ IF SO WHO  (SO I CAN MAKE HER REFERRAL.

## 2015-07-01 NOTE — Telephone Encounter (Signed)
Spoke with Colleen Medina last night about symptoms and results.  Doubled omeprazole to 40 mg daily and placed a referral to GI to assess for candidacy for endoscopy for further evaluation and possible esophageal dilatation.

## 2015-07-28 DIAGNOSIS — R1310 Dysphagia, unspecified: Secondary | ICD-10-CM | POA: Diagnosis not present

## 2015-07-28 DIAGNOSIS — K222 Esophageal obstruction: Secondary | ICD-10-CM | POA: Diagnosis not present

## 2015-08-05 ENCOUNTER — Encounter (HOSPITAL_COMMUNITY): Payer: Self-pay | Admitting: *Deleted

## 2015-08-10 ENCOUNTER — Encounter (HOSPITAL_COMMUNITY): Payer: Self-pay | Admitting: Anesthesiology

## 2015-08-10 ENCOUNTER — Other Ambulatory Visit: Payer: Self-pay | Admitting: Gastroenterology

## 2015-08-10 ENCOUNTER — Ambulatory Visit (HOSPITAL_COMMUNITY): Payer: Commercial Managed Care - HMO

## 2015-08-10 ENCOUNTER — Encounter (HOSPITAL_COMMUNITY)
Admission: RE | Disposition: A | Discharge status: Home / Self Care | Payer: Self-pay | Source: Ambulatory Visit | Attending: Gastroenterology

## 2015-08-10 ENCOUNTER — Ambulatory Visit (HOSPITAL_COMMUNITY)
Admission: RE | Admit: 2015-08-10 | Discharge: 2015-08-10 | Disposition: A | Discharge status: Home / Self Care | Payer: Commercial Managed Care - HMO | Source: Ambulatory Visit | Attending: Gastroenterology | Admitting: Gastroenterology

## 2015-08-10 ENCOUNTER — Ambulatory Visit (HOSPITAL_COMMUNITY): Payer: Commercial Managed Care - HMO | Admitting: Anesthesiology

## 2015-08-10 DIAGNOSIS — K644 Residual hemorrhoidal skin tags: Secondary | ICD-10-CM | POA: Diagnosis not present

## 2015-08-10 DIAGNOSIS — I872 Venous insufficiency (chronic) (peripheral): Secondary | ICD-10-CM | POA: Diagnosis not present

## 2015-08-10 DIAGNOSIS — R1310 Dysphagia, unspecified: Secondary | ICD-10-CM | POA: Diagnosis present

## 2015-08-10 DIAGNOSIS — K219 Gastro-esophageal reflux disease without esophagitis: Secondary | ICD-10-CM | POA: Insufficient documentation

## 2015-08-10 DIAGNOSIS — M19041 Primary osteoarthritis, right hand: Secondary | ICD-10-CM | POA: Diagnosis not present

## 2015-08-10 DIAGNOSIS — K449 Diaphragmatic hernia without obstruction or gangrene: Secondary | ICD-10-CM | POA: Diagnosis not present

## 2015-08-10 DIAGNOSIS — J45909 Unspecified asthma, uncomplicated: Secondary | ICD-10-CM | POA: Diagnosis not present

## 2015-08-10 DIAGNOSIS — Z79899 Other long term (current) drug therapy: Secondary | ICD-10-CM | POA: Insufficient documentation

## 2015-08-10 DIAGNOSIS — Z934 Other artificial openings of gastrointestinal tract status: Secondary | ICD-10-CM | POA: Insufficient documentation

## 2015-08-10 DIAGNOSIS — K5900 Constipation, unspecified: Secondary | ICD-10-CM | POA: Insufficient documentation

## 2015-08-10 DIAGNOSIS — F329 Major depressive disorder, single episode, unspecified: Secondary | ICD-10-CM | POA: Diagnosis not present

## 2015-08-10 DIAGNOSIS — E559 Vitamin D deficiency, unspecified: Secondary | ICD-10-CM | POA: Diagnosis not present

## 2015-08-10 DIAGNOSIS — E1140 Type 2 diabetes mellitus with diabetic neuropathy, unspecified: Secondary | ICD-10-CM | POA: Insufficient documentation

## 2015-08-10 DIAGNOSIS — K648 Other hemorrhoids: Secondary | ICD-10-CM | POA: Diagnosis not present

## 2015-08-10 DIAGNOSIS — E785 Hyperlipidemia, unspecified: Secondary | ICD-10-CM | POA: Diagnosis not present

## 2015-08-10 DIAGNOSIS — Z8719 Personal history of other diseases of the digestive system: Secondary | ICD-10-CM | POA: Insufficient documentation

## 2015-08-10 DIAGNOSIS — M179 Osteoarthritis of knee, unspecified: Secondary | ICD-10-CM | POA: Diagnosis not present

## 2015-08-10 DIAGNOSIS — Z86010 Personal history of colonic polyps: Secondary | ICD-10-CM | POA: Insufficient documentation

## 2015-08-10 DIAGNOSIS — E1143 Type 2 diabetes mellitus with diabetic autonomic (poly)neuropathy: Secondary | ICD-10-CM | POA: Insufficient documentation

## 2015-08-10 DIAGNOSIS — K222 Esophageal obstruction: Secondary | ICD-10-CM | POA: Diagnosis not present

## 2015-08-10 HISTORY — PX: ESOPHAGOGASTRODUODENOSCOPY (EGD) WITH PROPOFOL: SHX5813

## 2015-08-10 HISTORY — PX: SAVORY DILATION: SHX5439

## 2015-08-10 LAB — GLUCOSE, CAPILLARY: Glucose-Capillary: 73 mg/dL (ref 65–99)

## 2015-08-10 SURGERY — ESOPHAGOGASTRODUODENOSCOPY (EGD) WITH PROPOFOL
Anesthesia: Monitor Anesthesia Care

## 2015-08-10 MED ORDER — LACTATED RINGERS IV SOLN
INTRAVENOUS | Status: DC | PRN
  Administered 2015-08-10: 10:00:00 via INTRAVENOUS
  Administered 2015-08-10: 1000 mL

## 2015-08-10 MED ORDER — SODIUM CHLORIDE 0.9 % IV SOLN: INTRAVENOUS | Status: DC

## 2015-08-10 MED ORDER — DIPHENHYDRAMINE HCL 50 MG/ML IJ SOLN
INTRAMUSCULAR | Status: AC
  Filled 2015-08-10: qty 1

## 2015-08-10 MED ORDER — PROPOFOL 10 MG/ML IV BOLUS
INTRAVENOUS | Status: AC
  Filled 2015-08-10: qty 20

## 2015-08-10 MED ORDER — ONDANSETRON HCL 4 MG/2ML IJ SOLN
INTRAMUSCULAR | Status: AC
  Filled 2015-08-10: qty 2

## 2015-08-10 MED ORDER — SODIUM CHLORIDE 0.9 % IJ SOLN
INTRAMUSCULAR | Status: AC
  Filled 2015-08-10: qty 10

## 2015-08-10 MED ORDER — ONDANSETRON HCL 4 MG/2ML IJ SOLN
INTRAMUSCULAR | Status: DC | PRN
  Administered 2015-08-10: 4 mg via INTRAVENOUS

## 2015-08-10 MED ORDER — EPHEDRINE SULFATE 50 MG/ML IJ SOLN
INTRAMUSCULAR | Status: AC
  Filled 2015-08-10: qty 1

## 2015-08-10 MED ORDER — PROPOFOL 500 MG/50ML IV EMUL
INTRAVENOUS | Status: DC | PRN
  Administered 2015-08-10: 30 mg via INTRAVENOUS

## 2015-08-10 MED ORDER — PROPOFOL INFUSION 10 MG/ML OPTIME
INTRAVENOUS | Status: DC | PRN
  Administered 2015-08-10: 200 ug/kg/min via INTRAVENOUS

## 2015-08-10 SURGICAL SUPPLY — 14 items

## 2015-08-10 NOTE — Op Note (Signed)
[facility] [place], [address]   ENDOSCOPY PROCEDURE REPORT  PATIENT: Colleen, Medina  MR#: [id number] BIRTHDATE: October 13, 1941 , 73  yrs. old GENDER: female ENDOSCOPIST:Carreen Milius Oletta Lamas, MD REFERRED BY: PROCEDURE DATE:  08/10/2015 PROCEDURE:   EGD With Savary Dilatation using fluoroscopic guidance ASA CLASS:    class III INDICATIONS: patient who has had gastrojejunostomy because of poor gastric emptying due to diabetes. She's having dysphagia in recent barium swallow showed a narrowing in the thoracic and esophagus with hangup of the barium tablet. An endoscope had been passed through this very without any problems earlier this year. Due to her continuing dysphagia EGD with dilatation performed. MEDICATION: propofol 150 mg by anesthesia TOPICAL ANESTHETIC:   Cetacaine Spray  DESCRIPTION OF PROCEDURE:   After the risks and benefits of the procedure were explained, informed consent was obtained.  The Pentax Gastroscope [id number]  endoscope was introduced through the mouth  and advanced to the second portion of the duodenum .  The instrument was slowly withdrawn as the mucosa was fully examined. Estimated blood loss is zero unless otherwise noted in this procedure report. the stomach was suction clear of any fluid. The gastrojejunostomy site was seen. We then passed Savary guide wire and withdrew the scope using fluoroscopic guidance. We then passed 12.8 mm, 14 mm, and 15 mm dilator with a small amount of blood with the 15 mm dilator. The 15 mm dilator and guide wire were withdrawn as a unit.    The scope was then withdrawn from the patient and the procedure completed.  COMPLICATIONS: There were no immediate complications.  ENDOSCOPIC IMPRESSION: 1. Stricture of the thoracic esophagus seen on barium swallow. Not clearly obvious on EGD. Esophagus dilated to 15 mm RECOMMENDATIONS: 1. routine post dilatation orders. 2. Will follow-up in the office  in 3 months.   _______________________________ eSigned:  Laurence Spates, MD 08/10/2015 11:10 AM     cc: Dr. Oval Linsey  CPT CODES: ICD CODES:  The ICD and CPT codes recommended by this software are interpretations from the data that the clinical staff has captured with the software.  The verification of the translation of this report to the ICD and CPT codes and modifiers is the sole responsibility of the health care institution and practicing physician where this report was generated.  [employer] will not be held responsible for the validity of the ICD and CPT codes included on this report.  AMA assumes no liability for data contained or not contained herein. [employer] is a Designer, television/film set of the [employer].

## 2015-08-10 NOTE — Transfer of Care (Signed)
Immediate Anesthesia Transfer of Care Note  Patient: Colleen Medina  Procedure(s) Performed: Procedure(s): ESOPHAGOGASTRODUODENOSCOPY (EGD) WITH PROPOFOL (N/A) SAVORY DILATION (N/A)  Patient Location: PACU  Anesthesia Type:MAC  Level of Consciousness:  sedated, patient cooperative and responds to stimulation  Airway & Oxygen Therapy:Patient Spontanous Breathing and Patient connected to face mask oxgen  Post-op Assessment:  Report given to PACU RN and Post -op Vital signs reviewed and stable  Post vital signs:  Reviewed and stable  Last Vitals:  Filed Vitals:   08/10/15 1013  BP: 161/70  Pulse: 58  Temp: 36.5 C  Resp: 20    Complications: No apparent anesthesia complications

## 2015-08-10 NOTE — Anesthesia Preprocedure Evaluation (Signed)
Anesthesia Evaluation  Patient identified by MRN, date of birth, ID band Patient awake    Reviewed: Allergy & Precautions, NPO status , Patient's Chart, lab work & pertinent test results  Airway Mallampati: II  TM Distance: >3 FB Neck ROM: Full    Dental no notable dental hx. (+) Partial Upper   Pulmonary asthma ,  breath sounds clear to auscultation  Pulmonary exam normal       Cardiovascular negative cardio ROS Normal cardiovascular examRhythm:Regular Rate:Normal     Neuro/Psych negative neurological ROS  negative psych ROS   GI/Hepatic negative GI ROS, Neg liver ROS,   Endo/Other  diabetes, Type 2  Renal/GU negative Renal ROS  negative genitourinary   Musculoskeletal negative musculoskeletal ROS (+)   Abdominal   Peds negative pediatric ROS (+)  Hematology negative hematology ROS (+)   Anesthesia Other Findings   Reproductive/Obstetrics negative OB ROS                             Anesthesia Physical Anesthesia Plan  ASA: II  Anesthesia Plan: MAC   Post-op Pain Management:    Induction:   Airway Management Planned:   Additional Equipment:   Intra-op Plan:   Post-operative Plan:   Informed Consent: I have reviewed the patients History and Physical, chart, labs and discussed the procedure including the risks, benefits and alternatives for the proposed anesthesia with the patient or authorized representative who has indicated his/her understanding and acceptance.   Dental advisory given  Plan Discussed with: CRNA  Anesthesia Plan Comments:         Anesthesia Quick Evaluation

## 2015-08-10 NOTE — Anesthesia Postprocedure Evaluation (Signed)
  Anesthesia Post-op Note  Patient: Colleen Medina  Procedure(s) Performed: Procedure(s) (LRB): ESOPHAGOGASTRODUODENOSCOPY (EGD) WITH PROPOFOL (N/A) SAVORY DILATION (N/A)  Patient Location: PACU  Anesthesia Type: MAC  Level of Consciousness: awake and alert   Airway and Oxygen Therapy: Patient Spontanous Breathing  Post-op Pain: mild  Post-op Assessment: Post-op Vital signs reviewed, Patient's Cardiovascular Status Stable, Respiratory Function Stable, Patent Airway and No signs of Nausea or vomiting  Last Vitals:  Filed Vitals:   08/10/15 1120  BP: 176/79  Pulse: 58  Temp:   Resp: 14    Post-op Vital Signs: stable   Complications: No apparent anesthesia complications

## 2015-08-10 NOTE — Discharge Instructions (Signed)
Esophagogastroduodenoscopy Care After Refer to this sheet in the next few weeks. These instructions provide you with information on caring for yourself after your procedure. Your caregiver may also give you more specific instructions. Your treatment has been planned according to current medical practices, but problems sometimes occur. Call your caregiver if you have any problems or questions after your procedure.  HOME CARE INSTRUCTIONS  Do not eat or drink anything until the numbing medicine (local anesthetic) has worn off and your gag reflex has returned. You will know that the local anesthetic has worn off when you can swallow comfortably.  Do not drive for 12 hours after the procedure or as directed by your caregiver.  Only take medicines as directed by your caregiver. SEEK MEDICAL CARE IF:   You cannot stop coughing.  You are not urinating at all or less than usual. SEEK IMMEDIATE MEDICAL CARE IF:  You have difficulty swallowing.  You cannot eat or drink.  You have worsening throat or chest pain.  You have dizziness, lightheadedness, or you faint.  You have nausea or vomiting.  You have chills.  You have a fever.  You have severe abdominal pain.  You have black, tarry, or bloody stools. Document Released: 11/21/2012 Document Reviewed: 11/21/2012 [facility] Patient Information 2015 [place]. This information is not intended to replace advice given to you by your health care provider. Make sure you discuss any questions you have with your health care provider. Esophageal Dilatation The esophagus is the long, narrow tube which carries food and liquid from the mouth to the stomach. Esophageal dilatation is the technique used to stretch a blocked or narrowed portion of the esophagus. This procedure is used when a part of the esophagus has become so narrow that it becomes difficult, painful or even impossible to swallow. This is generally an uncomplicated form of treatment.  When this is not successful, chest surgery may be required. This is a much more extensive form of treatment with a longer recovery time. CAUSES  Some of the more common causes of blockage or strictures of the esophagus are:  Narrowing from longstanding inflammation (soreness and redness) of the lower esophagus. This comes from the constant exposure of the lower esophagus to the acid which bubbles up from the stomach. Over time this causes scarring and narrowing of the lower esophagus.  Hiatal hernia in which a small part of the stomach bulges (herniates) up through the diaphragm. This can cause a gradual narrowing of the end of the esophagus.  Schatzki ring is a narrow ring of benign (non-cancerous) fibrous tissue which constricts the lower esophagus. The reason for this is not known.  Scleroderma is a connective tissue disorder that affects the esophagus and makes swallowing difficult.  Achalasia is an absence of nerves to the lower esophagus and to the esophageal sphincter. This is the circular muscle between the stomach and esophagus that relaxes to allow food into the stomach. After swallowing, it contracts to keep food in the stomach. This absence of nerves may be congenital (present since birth). This can cause irregular spasms of the lower esophageal muscle. This spasm does not open up to allow food and fluid through. The result is a persistent blockage with subsequent slow trickling of the esophageal contents into the stomach.  Strictures may develop from swallowing materials which damage the esophagus. Some examples are strong acids or alkalis such as lye.  Growths such as benign (non-cancerous) and malignant (cancerous) tumors can block the esophagus.  Hereditary (present  since birth) causes. DIAGNOSIS  Your caregiver often suspects this problem by taking a medical history. They will also do a physical exam. They can then prove their suspicions using X-rays and endoscopy. Endoscopy is  an exam in which a tube like a small, flexible telescope is used to look at your esophagus.  TREATMENT There are different stretching (dilating) techniques that can be used. Simple bougie dilatation may be done in the office. This usually takes only a couple minutes. A numbing (anesthetic) spray of the throat is used. Endoscopy, when done, is done in an endoscopy suite under mild sedation. When fluoroscopy is used, the procedure is performed in X-ray. Other techniques require a little longer time. Recovery is usually quick. There is no waiting time to begin eating and drinking to test success of the treatment. Following are some of the methods used. Narrowing of the esophagus is treated by making it bigger. Commonly this is a mechanical problem which can be treated with stretching. This can be done in different ways. Your caregiver will discuss these with you. Some of the means used are:  A series of graduated (increasing thickness) flexible dilators can be used. These are weighted tubes passed through the esophagus into the stomach. The tubes used become progressively larger until the desired stretched size is reached. Graduated dilators are a simple and quick way of opening the esophagus. No visualization is required.  Another method is the use of endoscopy to place a flexible wire across the stricture. The endoscope is removed and the wire left in place. A dilator with a hole through it from end to end is guided down the esophagus and across the stricture. One or more of these dilators are passed over the wire. At the end of the exam, the wire is removed. This type of treatment may be performed in the X-ray department under fluoroscopy. An advantage of this procedure is the examiner is visualizing the end opening in the esophagus.  Stretching of the esophagus may be done using balloons. Deflated balloons are placed through the endoscope and across the stricture. This type of balloon dilatation is often  done at the time of endoscopy or fluoroscopy. Flexible endoscopy allows the examiner to directly view the stricture. A balloon is inserted in the deflated form into the area of narrowing. It is then inflated with air to a certain pressure that is preset for a given circumference. When inflated, it becomes sausage shaped, stretched, and makes the stricture larger.  Achalasia requires a longer, larger balloon-type dilator. This is frequently done under X-ray control. In this situation, the spastic muscle fibers in the lower esophagus are stretched. All of the above procedures make the passage of food and water into the stomach easier. They also make it easier for stomach contents to reflux back into the esophagus. Special medications may be used following the procedure to help prevent further stricturing. Proton-pump inhibitor medications are good at decreasing the amount of acid in the stomach juice. When stomach juice refluxes into the esophagus, the juice is no longer as acidic and is less likely to burn or scar the esophagus. RISKS AND COMPLICATIONS Esophageal dilatation is usually performed effectively and without problems. Some complications that can occur are:  A small amount of bleeding almost always happens where the stretching takes place. If this is too excessive it may require more aggressive treatment.  An uncommon complication is perforation (making a hole) of the esophagus. The esophagus is thin. It is easy to make  a hole in it. If this happens, an operation may be necessary to repair this.  A small, undetected perforation could lead to an infection in the chest. This can be very serious. HOME CARE INSTRUCTIONS   If you received sedation for your procedure, do not drive, make important decisions, or perform any activities requiring your full coordination. Do not drink alcohol, take sedatives, or use any mind altering chemicals unless instructed by your caregiver.  You may use throat  lozenges or warm salt water gargles if you have throat discomfort.  You can begin eating and drinking normally on return home unless instructed otherwise. Do not purposely try to force large chunks of food down to test the benefits of your procedure.  Mild discomfort can be eased with sips of ice water.  Medications for discomfort may or may not be needed. SEEK IMMEDIATE MEDICAL CARE IF:   You begin vomiting up blood.  You develop black, tarry stools.  You develop chills or an unexplained temperature of over 101F (38.3C)  You develop chest or abdominal pain.  You develop shortness of breath, or feel light-headed or faint.  Your swallowing is becoming more painful, difficult, or you are unable to swallow. MAKE SURE YOU:   Understand these instructions.  Will watch your condition.  Will get help right away if you are not doing well or get worse. Document Released: 01/26/2006 Document Revised: 04/21/2014 Document Reviewed: 03/15/2006 [facility] Patient Information 2015 [place], [place]. This information is not intended to replace advice given to you by your health care provider. Make sure you discuss any questions you have with your health care provider. Continue current meds. Clear liquids for 4-6 hours, if no chest pain or trouble breathing, soft foods tonight.  Regular diet tomorrow. Call for problems.

## 2015-08-10 NOTE — Addendum Note (Signed)
Addended by: Vernie Ammons. on: 08/10/2015 09:39 AM   Modules accepted: Orders

## 2015-08-10 NOTE — Addendum Note (Signed)
Addended by: Arta Silence on: 08/10/2015 09:35 PM   Modules accepted: Orders
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
## 2015-08-11 ENCOUNTER — Encounter (HOSPITAL_COMMUNITY): Payer: Self-pay | Admitting: Gastroenterology

## 2015-08-27 ENCOUNTER — Encounter: Payer: Self-pay | Admitting: Internal Medicine

## 2015-08-27 DIAGNOSIS — I700 Atherosclerosis of aorta: Secondary | ICD-10-CM

## 2015-08-27 HISTORY — DX: Atherosclerosis of aorta: I70.0

## 2015-11-10 ENCOUNTER — Encounter: Payer: Self-pay | Admitting: Student

## 2015-11-20 ENCOUNTER — Ambulatory Visit: Payer: Commercial Managed Care - HMO | Admitting: Internal Medicine

## 2015-11-30 ENCOUNTER — Encounter: Payer: Commercial Managed Care - HMO | Admitting: Internal Medicine

## 2015-12-01 ENCOUNTER — Other Ambulatory Visit: Payer: Self-pay

## 2015-12-01 DIAGNOSIS — J4520 Mild intermittent asthma, uncomplicated: Secondary | ICD-10-CM

## 2015-12-01 DIAGNOSIS — K219 Gastro-esophageal reflux disease without esophagitis: Secondary | ICD-10-CM

## 2015-12-01 DIAGNOSIS — F329 Major depressive disorder, single episode, unspecified: Secondary | ICD-10-CM

## 2015-12-01 DIAGNOSIS — E785 Hyperlipidemia, unspecified: Secondary | ICD-10-CM

## 2015-12-01 DIAGNOSIS — K449 Diaphragmatic hernia without obstruction or gangrene: Secondary | ICD-10-CM

## 2015-12-01 MED ORDER — OMEPRAZOLE 20 MG PO CPDR: 20.0000 mg | DELAYED_RELEASE_CAPSULE | Freq: Every day | ORAL | Status: DC

## 2015-12-01 MED ORDER — ALBUTEROL SULFATE HFA 108 (90 BASE) MCG/ACT IN AERS: 1.0000 | INHALATION_SPRAY | Freq: Four times a day (QID) | RESPIRATORY_TRACT | Status: DC | PRN

## 2015-12-01 MED ORDER — TRAMADOL HCL 50 MG PO TABS: 50.0000 mg | ORAL_TABLET | Freq: Four times a day (QID) | ORAL | Status: DC | PRN

## 2015-12-01 MED ORDER — HYOSCYAMINE SULFATE 0.125 MG SL SUBL: 0.2500 mg | SUBLINGUAL_TABLET | Freq: Four times a day (QID) | SUBLINGUAL | Status: DC | PRN

## 2015-12-01 MED ORDER — ONDANSETRON 8 MG PO TBDP: 8.0000 mg | ORAL_TABLET | Freq: Three times a day (TID) | ORAL | Status: DC | PRN

## 2015-12-01 MED ORDER — SIMVASTATIN 80 MG PO TABS: 80.0000 mg | ORAL_TABLET | Freq: Every day | ORAL | Status: DC

## 2015-12-01 MED ORDER — SERTRALINE HCL 100 MG PO TABS: 100.0000 mg | ORAL_TABLET | Freq: Every day | ORAL | Status: DC

## 2015-12-01 NOTE — Telephone Encounter (Signed)
Pt requesting refills on all medications other than nitro and bowel regimen.  Asking to be sent to [facility] on [address]

## 2015-12-02 ENCOUNTER — Other Ambulatory Visit: Payer: Self-pay | Admitting: Internal Medicine

## 2015-12-02 NOTE — Telephone Encounter (Signed)
Spoke w/ pharm, clarified script

## 2015-12-02 NOTE — Telephone Encounter (Signed)
Colleen Medina from [place] requesting the nurse to call back regarding tramadol.

## 2015-12-02 NOTE — Telephone Encounter (Signed)
rx phoned in

## 2015-12-16 DIAGNOSIS — H5210 Myopia, unspecified eye: Secondary | ICD-10-CM | POA: Diagnosis not present

## 2015-12-16 DIAGNOSIS — I10 Essential (primary) hypertension: Secondary | ICD-10-CM | POA: Diagnosis not present

## 2015-12-16 DIAGNOSIS — E119 Type 2 diabetes mellitus without complications: Secondary | ICD-10-CM | POA: Diagnosis not present

## 2015-12-16 DIAGNOSIS — H25813 Combined forms of age-related cataract, bilateral: Secondary | ICD-10-CM | POA: Diagnosis not present

## 2015-12-16 DIAGNOSIS — H5203 Hypermetropia, bilateral: Secondary | ICD-10-CM | POA: Diagnosis not present

## 2015-12-16 DIAGNOSIS — H524 Presbyopia: Secondary | ICD-10-CM | POA: Diagnosis not present

## 2015-12-16 DIAGNOSIS — H52222 Regular astigmatism, left eye: Secondary | ICD-10-CM | POA: Diagnosis not present

## 2015-12-16 LAB — HM DIABETES EYE EXAM

## 2015-12-23 ENCOUNTER — Encounter: Payer: Self-pay | Admitting: *Deleted

## 2016-01-01 ENCOUNTER — Encounter: Payer: Self-pay | Admitting: Internal Medicine

## 2016-01-01 ENCOUNTER — Ambulatory Visit (INDEPENDENT_AMBULATORY_CARE_PROVIDER_SITE_OTHER): Payer: PPO | Admitting: Internal Medicine

## 2016-01-01 VITALS — BP 139/77 | HR 58 | Temp 99.0°F | Wt 123.9 lb

## 2016-01-01 DIAGNOSIS — K3184 Gastroparesis: Secondary | ICD-10-CM | POA: Diagnosis not present

## 2016-01-01 DIAGNOSIS — Z6825 Body mass index (BMI) 25.0-25.9, adult: Secondary | ICD-10-CM

## 2016-01-01 DIAGNOSIS — E785 Hyperlipidemia, unspecified: Secondary | ICD-10-CM

## 2016-01-01 DIAGNOSIS — I700 Atherosclerosis of aorta: Secondary | ICD-10-CM

## 2016-01-01 DIAGNOSIS — E663 Overweight: Secondary | ICD-10-CM

## 2016-01-01 DIAGNOSIS — J302 Other seasonal allergic rhinitis: Secondary | ICD-10-CM

## 2016-01-01 DIAGNOSIS — K219 Gastro-esophageal reflux disease without esophagitis: Secondary | ICD-10-CM

## 2016-01-01 DIAGNOSIS — Z23 Encounter for immunization: Secondary | ICD-10-CM

## 2016-01-01 DIAGNOSIS — J4520 Mild intermittent asthma, uncomplicated: Secondary | ICD-10-CM | POA: Diagnosis not present

## 2016-01-01 DIAGNOSIS — M150 Primary generalized (osteo)arthritis: Secondary | ICD-10-CM

## 2016-01-01 DIAGNOSIS — F329 Major depressive disorder, single episode, unspecified: Secondary | ICD-10-CM

## 2016-01-01 DIAGNOSIS — M7551 Bursitis of right shoulder: Secondary | ICD-10-CM

## 2016-01-01 DIAGNOSIS — Z7951 Long term (current) use of inhaled steroids: Secondary | ICD-10-CM

## 2016-01-01 DIAGNOSIS — M1711 Unilateral primary osteoarthritis, right knee: Secondary | ICD-10-CM

## 2016-01-01 DIAGNOSIS — N393 Stress incontinence (female) (male): Secondary | ICD-10-CM

## 2016-01-01 DIAGNOSIS — K449 Diaphragmatic hernia without obstruction or gangrene: Secondary | ICD-10-CM

## 2016-01-01 DIAGNOSIS — E1143 Type 2 diabetes mellitus with diabetic autonomic (poly)neuropathy: Secondary | ICD-10-CM | POA: Diagnosis not present

## 2016-01-01 DIAGNOSIS — N39490 Overflow incontinence: Secondary | ICD-10-CM

## 2016-01-01 DIAGNOSIS — I872 Venous insufficiency (chronic) (peripheral): Secondary | ICD-10-CM

## 2016-01-01 DIAGNOSIS — M159 Polyosteoarthritis, unspecified: Secondary | ICD-10-CM

## 2016-01-01 HISTORY — DX: Stress incontinence (female) (male): N39.3

## 2016-01-01 LAB — POCT GLYCOSYLATED HEMOGLOBIN (HGB A1C): Hemoglobin A1C: 6.3

## 2016-01-01 LAB — GLUCOSE, CAPILLARY: GLUCOSE-CAPILLARY: 98 mg/dL (ref 65–99)

## 2016-01-01 NOTE — Assessment & Plan Note (Signed)
Assessment  Over the last 2 months her chronic venous insufficiency has worsened. She has also noted some pigment changes in the feet and ankles. She states the symptoms are worse in the evening and the swelling is improved in the morning when she wakes up. She has had no ulcerations. She has not been interested in using her knee-high compression stockings to this point.  Plan  We reviewed the complications of chronic venous insufficiency and the possible subsequent development of chronic venous ulcers. I showed her the skin changes and subsequent venous ulceration in such patients with pictures obtained on the Internet. She is now interested in trying her knee-high compression stockings. We will reassess the efficacy of this therapy at follow-up. It is possible she may require another prescription for compression stockings. If tolerated, it may be better that she try thigh-high compression stockings if we were to write another prescription for the compression stockings. This would be at 20 mmHg.

## 2016-01-01 NOTE — Assessment & Plan Note (Signed)
For her general health care maintenance she received the flu vaccination today. At the follow-up visit we will provide her with the Prevnar 13 Pneumovax. For her diabetes health maintenance she had a foot exam done today as well as a hemoglobin A1c drawn as noted above. She also had a urine for microalbumin that is pending at the time of this dictation. Since she was having some urinary symptoms of stress incontinence a urinalysis was also sent to assess for an infection.

## 2016-01-01 NOTE — Patient Instructions (Signed)
It was great to see you again.  It has been a while.  1) Great job with your diabetes.  Keep up with your diet and exercise.  2) Try your compression stockings to see if we can get the swelling down.  3) Keep taking your medications as you are.  You can stop your weekly vitamin D.  I have updated your medication list.  4) We gave you a flu shot today.  5) I checked your urine for protein from the diabetes and looking for an infection.  6) Keep working on your Kegel exercises.  I will see you back in 3 months, sooner if necessary.
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
## 2016-01-01 NOTE — Assessment & Plan Note (Signed)
Assessment  Her mild intermittent intrinsic asthma has been well controlled on as needed albuterol. She has had no exacerbations of her asthma since last seen in the clinic.  Plan  Continue the albuterol MDI as needed for her mild intermittent asthma symptoms. We will reassess her symptom control and albuterol usage at the follow-up visit.

## 2016-01-01 NOTE — Assessment & Plan Note (Signed)
Assessment  She has put on 4 pounds over the holidays and this is resulted from a normal BMI to a slightly overweight BMI of 25.2.  Plan  She was encouraged to continue her diet for her diet-controlled diabetes. With this her weight had decreased into the normal range. It is hopeful this will occur now that the holidays are over. We will reassess her weight at the follow-up visit.
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
## 2016-01-01 NOTE — Assessment & Plan Note (Signed)
Assessment  Stable right knee osteoarthritic pain on the as needed tramadol.  Plan  We will continue the tramadol 50 mg by mouth every 6 hours as needed for pain. We will reassess the efficacy of this pharmacologic therapy at the follow-up visit.

## 2016-01-01 NOTE — Assessment & Plan Note (Signed)
Assessment  Her subacromial bursitis on the right continues to bother her on occasion. We discussed possible injection therapy. She was not interested in such therapy at this time.  Plan  She will continue the as needed tramadol. Should she be interested in a steroid injection we will offer this at the follow-up visit.

## 2016-01-01 NOTE — Assessment & Plan Note (Signed)
Assessment  Her diet-controlled diabetes remains well managed at this time with diet alone. Her hemoglobin A1c this morning was 6.3. The associated gastroparesis is asymptomatic at this time as she has not required much of her Zofran. That being said, she does occasionally use it as needed.  Plan  She was praised for her continued excellent control of her diabetes with her diet alone. She was encouraged to continue her current diet and to use the Zofran as needed for any nausea associated with her gastroparesis. We will reassess her diabetic control at the follow-up visit in 3 months.

## 2016-01-01 NOTE — Assessment & Plan Note (Signed)
Assessment  She has minimal symptoms of reflux while taking the omeprazole 20 mg by mouth daily. She is also had no sensation of food sticking in her esophagus since her dilatation.  Plan  Continue the omeprazole 20 mg by mouth daily and monitoring for evidence of symptomatic reflux or recurrence of her stricture.

## 2016-01-01 NOTE — Assessment & Plan Note (Signed)
Assessment  She is tolerating her simvastatin 80 mg by mouth at bedtime without myalgias.  Plan  We will continue the simvastatin at 80 mg by mouth at bedtime and reassess for evidence of intolerances to the statin at the follow-up visit.

## 2016-01-01 NOTE — Assessment & Plan Note (Signed)
Assessment  She has not had symptomatic aortic atherosclerosis. Specifically, she denies claudication at this time.  Plan  We will continue aggressive control of her risk factors including her diet-controlled diabetes and hyperlipidemia and reassess for symptoms at the follow-up visit.

## 2016-01-01 NOTE — Assessment & Plan Note (Signed)
Assessment  She notes on occasion that she has a sensation of inadequate or incomplete bladder emptying. When these symptoms or sensations occur she will also have stress urinary incontinence with cough. She has already started [place] exercises but this has been very recently.  Plan  I encouraged her to continue with the [place] exercises since she only just started them. Is hopeful this will improve her stress urinary incontinence symptoms. We will reassess her symptomatology at the follow-up visit.

## 2016-01-01 NOTE — Assessment & Plan Note (Signed)
Assessment  Her major depression is well controlled on the Zoloft 100 mg by mouth daily with the PHQ-2 screen of 0. She is interested in continuing therapy with the Zoloft at this time.  Plan  We will continue the Zoloft at 100 mg by mouth daily and reassess for evidence of depression at the follow-up visit.

## 2016-01-02 LAB — MICROALBUMIN / CREATININE URINE RATIO
Creatinine, Urine: 80.1 mg/dL
MICROALB/CREAT RATIO: <3.7 mg/g creat (ref 0.0–30.0)
Microalbumin, Urine: <3 ug/mL

## 2016-01-02 LAB — URINALYSIS, ROUTINE W REFLEX MICROSCOPIC
BILIRUBIN UA: NEGATIVE
Glucose, UA: NEGATIVE
KETONES UA: NEGATIVE
LEUKOCYTES UA: NEGATIVE
Nitrite, UA: NEGATIVE
PROTEIN UA: NEGATIVE
RBC UA: NEGATIVE
SPEC GRAV UA: 1.018 (ref 1.005–1.030)
Urobilinogen, Ur: 0.2 mg/dL (ref 0.2–1.0)
pH, UA: 6 (ref 5.0–7.5)

## 2016-01-05 NOTE — Progress Notes (Signed)
Microalbumin < 3.0 Microalbumin/creatinine < 3.7 Urinalysis unremarkable (negative for leuk est or nitrite)  Will continue with Keagle exercises and reassess symptoms at the follow-up visit.  I do not believe her symptoms are related to a UTI.  She does not have evidence of kidney involvement of her diabetes.  I called Colleen Medina and informed her of the results and my continued recommendations.

## 2016-02-09 DIAGNOSIS — H2512 Age-related nuclear cataract, left eye: Secondary | ICD-10-CM | POA: Diagnosis not present

## 2016-02-09 DIAGNOSIS — H18411 Arcus senilis, right eye: Secondary | ICD-10-CM | POA: Diagnosis not present

## 2016-02-09 DIAGNOSIS — H2511 Age-related nuclear cataract, right eye: Secondary | ICD-10-CM | POA: Diagnosis not present

## 2016-02-09 DIAGNOSIS — H18412 Arcus senilis, left eye: Secondary | ICD-10-CM | POA: Diagnosis not present

## 2016-02-12 DIAGNOSIS — E119 Type 2 diabetes mellitus without complications: Secondary | ICD-10-CM | POA: Diagnosis not present

## 2016-03-18 LAB — GLUCOSE, POCT (MANUAL RESULT ENTRY): POC Glucose: 86 mg/dl (ref 70–99)

## 2016-03-19 HISTORY — PX: CATARACT EXTRACTION W/PHACO: SHX586

## 2016-03-21 DIAGNOSIS — H2512 Age-related nuclear cataract, left eye: Secondary | ICD-10-CM | POA: Diagnosis not present

## 2016-03-21 DIAGNOSIS — H52222 Regular astigmatism, left eye: Secondary | ICD-10-CM | POA: Diagnosis not present

## 2016-03-21 DIAGNOSIS — H5212 Myopia, left eye: Secondary | ICD-10-CM | POA: Diagnosis not present

## 2016-03-21 DIAGNOSIS — H25812 Combined forms of age-related cataract, left eye: Secondary | ICD-10-CM | POA: Diagnosis not present

## 2016-04-14 ENCOUNTER — Telehealth: Payer: Self-pay | Admitting: Internal Medicine

## 2016-04-14 NOTE — Telephone Encounter (Signed)
APPT. REMINDER CALL, LMTCB °

## 2016-04-15 ENCOUNTER — Encounter: Payer: Self-pay | Admitting: Internal Medicine

## 2016-04-15 ENCOUNTER — Ambulatory Visit (INDEPENDENT_AMBULATORY_CARE_PROVIDER_SITE_OTHER): Payer: PPO | Admitting: Internal Medicine

## 2016-04-15 VITALS — BP 136/74 | HR 63 | Temp 98.3°F | Wt 123.9 lb

## 2016-04-15 DIAGNOSIS — K219 Gastro-esophageal reflux disease without esophagitis: Secondary | ICD-10-CM

## 2016-04-15 DIAGNOSIS — N393 Stress incontinence (female) (male): Secondary | ICD-10-CM

## 2016-04-15 DIAGNOSIS — E1143 Type 2 diabetes mellitus with diabetic autonomic (poly)neuropathy: Secondary | ICD-10-CM

## 2016-04-15 DIAGNOSIS — K3184 Gastroparesis: Secondary | ICD-10-CM

## 2016-04-15 DIAGNOSIS — Z23 Encounter for immunization: Secondary | ICD-10-CM

## 2016-04-15 DIAGNOSIS — K449 Diaphragmatic hernia without obstruction or gangrene: Secondary | ICD-10-CM

## 2016-04-15 DIAGNOSIS — N39490 Overflow incontinence: Secondary | ICD-10-CM

## 2016-04-15 DIAGNOSIS — J4520 Mild intermittent asthma, uncomplicated: Secondary | ICD-10-CM | POA: Diagnosis not present

## 2016-04-15 DIAGNOSIS — Z79899 Other long term (current) drug therapy: Secondary | ICD-10-CM

## 2016-04-15 DIAGNOSIS — I872 Venous insufficiency (chronic) (peripheral): Secondary | ICD-10-CM

## 2016-04-15 DIAGNOSIS — E785 Hyperlipidemia, unspecified: Secondary | ICD-10-CM

## 2016-04-15 DIAGNOSIS — F329 Major depressive disorder, single episode, unspecified: Secondary | ICD-10-CM

## 2016-04-15 DIAGNOSIS — I700 Atherosclerosis of aorta: Secondary | ICD-10-CM

## 2016-04-15 DIAGNOSIS — Z7951 Long term (current) use of inhaled steroids: Secondary | ICD-10-CM

## 2016-04-15 LAB — POCT GLYCOSYLATED HEMOGLOBIN (HGB A1C): HEMOGLOBIN A1C: 6.2

## 2016-04-15 LAB — GLUCOSE, CAPILLARY: GLUCOSE-CAPILLARY: 106 mg/dL — AB (ref 65–99)

## 2016-04-15 MED ORDER — SERTRALINE HCL 100 MG PO TABS: 200.0000 mg | ORAL_TABLET | Freq: Every day | ORAL | Status: DC

## 2016-04-15 NOTE — Assessment & Plan Note (Signed)
Assessment  Her chronic venous insufficiency is inadequately controlled with knee-high compression stockings. She states that these will frequently fall down. She is interested in using thigh-high compression stockings and will look into getting these fitted and purchased.  Plan  We will reassess her symptoms of chronic venous insufficiency at the follow-up visit when she has obtained the thigh-high compression stockings.

## 2016-04-15 NOTE — Assessment & Plan Note (Signed)
Assessment  Her diabetes remains well controlled with diet alone. Her hemoglobin A1c today was 6.2.  Plan  She was praised on the excellent control of her diabetes with her diet. We will obtain a urine for microalbumin at the next visit as well as discuss the initiation of aspirin 81 mg by mouth daily. We will repeat the hemoglobin A1c at the follow-up visit to assure continued excellent control of her diabetes.

## 2016-04-15 NOTE — Progress Notes (Signed)
   Subjective:    Patient ID: Colleen Medina, female    DOB: May 08, 1941, 75 y.o.   MRN: [id number]  HPI  Colleen Medina is here for follow-up of depression, diabetes, chronic venous insufficiency, and gastroesophageal reflux disease.   Please see the A&P for the status of the pt's chronic medical problems.  Her only new complaint is a concern about her blood pressure which has been intermittently elevated in stressful situations. For instance she states she was told that her blood pressure was elevated last week when she was waiting for her left eye cataract surgery. She has no history of essential hypertension in the past and has always had good blood pressures with me in the office. Her blood pressure today was also considered normal. We discussed the possibility that this elevated blood pressure preoperatively may have been related to anxiety. Rather than starting pharmacologic therapy we both felt it would be reasonable to follow this before initiating therapy.  Review of Systems  Constitutional: Positive for fatigue. Negative for activity change, appetite change and unexpected weight change.  Respiratory: Negative for cough, chest tightness, shortness of breath and wheezing.   Cardiovascular: Positive for leg swelling. Negative for chest pain and palpitations.  Gastrointestinal: Positive for nausea. Negative for vomiting, abdominal pain, diarrhea and constipation.       Occasional nausea that responds to ondansetron  Genitourinary: Positive for urgency.       Improving with [place] exercises  Musculoskeletal: Negative for joint swelling and arthralgias.  Skin: Negative for rash and wound.  Neurological: Positive for headaches. Negative for dizziness, weakness and light-headedness.  Psychiatric/Behavioral: Positive for dysphoric mood.      Objective:   Physical Exam  Constitutional: She is oriented to person, place, and time. She appears well-developed and well-nourished. No  distress.  HENT:  Head: Normocephalic and atraumatic.  Eyes: Conjunctivae are normal. Right eye exhibits no discharge. Left eye exhibits no discharge. No scleral icterus.  Musculoskeletal: Normal range of motion. She exhibits edema. She exhibits no tenderness.  Neurological: She is alert and oriented to person, place, and time. She exhibits normal muscle tone.  Skin: Skin is warm and dry. No rash noted. She is not diaphoretic. No erythema.  Psychiatric: She has a normal mood and affect. Her behavior is normal. Judgment and thought content normal.  Nursing note and vitals reviewed.     Assessment & Plan:   Please see problem based charting.

## 2016-04-15 NOTE — Assessment & Plan Note (Signed)
She received her Prevnar 13 vaccination today. At the follow-up visit we will get a urine microalbumin and discuss initiation of aspirin 81 mg by mouth daily. Otherwise she is up-to-date on her health care maintenance.
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
## 2016-04-15 NOTE — Assessment & Plan Note (Signed)
Assessment  She is currently asymptomatic from her aortic atherosclerotic disease. We continue with risk factor modification, specifically treating her hyperlipidemia and her diabetes.  Plan  At the follow-up visit we will discuss initiating aspirin 81 mg by mouth daily. We will also reassess for symptoms of claudication.

## 2016-04-15 NOTE — Assessment & Plan Note (Signed)
Assessment  She is tolerating the simvastatin 80 mg by mouth every night well without myalgias.  Plan  We will continue the simvastatin at 80 mg by mouth every night and reassess for intolerances at the follow-up visit.

## 2016-04-15 NOTE — Assessment & Plan Note (Signed)
Assessment  She has been diligently working on her Kegel exercises to help with her overflow stress urinary incontinence. She states that although she does still have some difficulty with this, it is much improved.  Plan  She will continue her Kegel exercises and we will reassess her symptoms of stress urinary incontinence at the follow-up visit.

## 2016-04-15 NOTE — Patient Instructions (Signed)
It was great to see you again.  I am sorry you have all of the stressors in your life.  I think this is playing a big role in your headaches and fatigue.  We will keep an eye on your blood pressure as it looks very good today on no medications.  1) Keep taking the medications as you are.  The only change is increasing the zoloft (sertraline) to 200 mg (2 tablets) daily.  2) I agree with considering the thigh high compression stockings for your leg swelling.  3) We gave you a pneumonia shot today.  4) Keep doing the [facility] exercises.  I think they are working.  I will see you back in 6 months, sooner if necessary.

## 2016-04-15 NOTE — Assessment & Plan Note (Signed)
Assessment  She continues to have very infrequent need for the use of as needed albuterol for her mild intermittent asthma. Thus, it remains well controlled at this time.  Plan  We will continue the albuterol 2 puffs every 6 hours as needed for shortness of breath. We will reassess her symptomatic asthma control at the follow-up visit.

## 2016-04-15 NOTE — Assessment & Plan Note (Signed)
Assessment  Her gastroesophageal reflux disease is symptomatically controlled with omeprazole 20 mg by mouth daily.  Plan  We will continue the omeprazole 20 mg by mouth daily and reassess for symptoms of gastroesophageal reflux disease at the follow-up visit. In addition, she has noted no further food sticking in her lower esophagus which required dilatation in the past. Thus, it appears she is not require dilatation at this time.

## 2016-04-18 DIAGNOSIS — H2511 Age-related nuclear cataract, right eye: Secondary | ICD-10-CM | POA: Diagnosis not present

## 2016-04-18 DIAGNOSIS — H5211 Myopia, right eye: Secondary | ICD-10-CM | POA: Diagnosis not present

## 2016-04-18 DIAGNOSIS — H25811 Combined forms of age-related cataract, right eye: Secondary | ICD-10-CM | POA: Diagnosis not present

## 2016-04-18 HISTORY — PX: CATARACT EXTRACTION W/PHACO: SHX586

## 2016-04-25 DIAGNOSIS — H5211 Myopia, right eye: Secondary | ICD-10-CM | POA: Diagnosis not present

## 2016-05-13 DIAGNOSIS — H5212 Myopia, left eye: Secondary | ICD-10-CM | POA: Diagnosis not present

## 2016-05-13 DIAGNOSIS — Z961 Presence of intraocular lens: Secondary | ICD-10-CM | POA: Diagnosis not present

## 2016-05-13 DIAGNOSIS — H5211 Myopia, right eye: Secondary | ICD-10-CM | POA: Diagnosis not present

## 2016-08-12 DIAGNOSIS — Z1231 Encounter for screening mammogram for malignant neoplasm of breast: Secondary | ICD-10-CM | POA: Diagnosis not present

## 2016-09-06 DIAGNOSIS — H52223 Regular astigmatism, bilateral: Secondary | ICD-10-CM | POA: Diagnosis not present

## 2016-09-06 DIAGNOSIS — H524 Presbyopia: Secondary | ICD-10-CM | POA: Diagnosis not present

## 2016-09-23 ENCOUNTER — Ambulatory Visit (INDEPENDENT_AMBULATORY_CARE_PROVIDER_SITE_OTHER): Payer: PPO | Admitting: Internal Medicine

## 2016-09-23 ENCOUNTER — Encounter: Payer: Self-pay | Admitting: Internal Medicine

## 2016-09-23 VITALS — BP 154/84 | HR 70 | Temp 98.0°F | Wt 125.5 lb

## 2016-09-23 DIAGNOSIS — K3184 Gastroparesis: Secondary | ICD-10-CM | POA: Diagnosis not present

## 2016-09-23 DIAGNOSIS — I10 Essential (primary) hypertension: Secondary | ICD-10-CM | POA: Diagnosis not present

## 2016-09-23 DIAGNOSIS — I700 Atherosclerosis of aorta: Secondary | ICD-10-CM | POA: Diagnosis not present

## 2016-09-23 DIAGNOSIS — M797 Fibromyalgia: Secondary | ICD-10-CM

## 2016-09-23 DIAGNOSIS — M791 Myalgia: Secondary | ICD-10-CM

## 2016-09-23 DIAGNOSIS — J4520 Mild intermittent asthma, uncomplicated: Secondary | ICD-10-CM

## 2016-09-23 DIAGNOSIS — F329 Major depressive disorder, single episode, unspecified: Secondary | ICD-10-CM

## 2016-09-23 DIAGNOSIS — I872 Venous insufficiency (chronic) (peripheral): Secondary | ICD-10-CM | POA: Diagnosis not present

## 2016-09-23 DIAGNOSIS — E785 Hyperlipidemia, unspecified: Secondary | ICD-10-CM | POA: Diagnosis not present

## 2016-09-23 DIAGNOSIS — Z0000 Encounter for general adult medical examination without abnormal findings: Secondary | ICD-10-CM

## 2016-09-23 DIAGNOSIS — N39490 Overflow incontinence: Secondary | ICD-10-CM

## 2016-09-23 DIAGNOSIS — Z23 Encounter for immunization: Secondary | ICD-10-CM

## 2016-09-23 DIAGNOSIS — E1143 Type 2 diabetes mellitus with diabetic autonomic (poly)neuropathy: Secondary | ICD-10-CM

## 2016-09-23 DIAGNOSIS — N393 Stress incontinence (female) (male): Secondary | ICD-10-CM

## 2016-09-23 DIAGNOSIS — Z79899 Other long term (current) drug therapy: Secondary | ICD-10-CM

## 2016-09-23 HISTORY — DX: Essential (primary) hypertension: I10

## 2016-09-23 HISTORY — DX: Fibromyalgia: M79.7

## 2016-09-23 LAB — POCT GLYCOSYLATED HEMOGLOBIN (HGB A1C): HEMOGLOBIN A1C: 6.3

## 2016-09-23 LAB — GLUCOSE, CAPILLARY: GLUCOSE-CAPILLARY: 94 mg/dL (ref 65–99)

## 2016-09-23 MED ORDER — ASPIRIN EC 81 MG PO TBEC: 81.0000 mg | DELAYED_RELEASE_TABLET | Freq: Every day | ORAL | 3 refills | Status: DC

## 2016-09-23 MED ORDER — GABAPENTIN 300 MG PO CAPS: 300.0000 mg | ORAL_CAPSULE | Freq: Every day | ORAL | 3 refills | Status: DC

## 2016-09-23 MED ORDER — LISINOPRIL 10 MG PO TABS: 10.0000 mg | ORAL_TABLET | Freq: Every day | ORAL | 3 refills | Status: DC

## 2016-09-23 NOTE — Assessment & Plan Note (Signed)
Assessment  Her mild intermittent intrinsic asthma is well controlled on as needed albuterol.  Plan  We will continue the as needed albuterol for her well controlled mild intermittent asthma and reassess for worsening of symptoms at the follow-up visit.

## 2016-09-23 NOTE — Assessment & Plan Note (Signed)
Assessment  She notes a 2 month history of intermittent diffuse muscle pain that she describes is sharp and stabbing, nonradiating, not associated with to [place] activity or time of day, and located in the arms, hands, back, and lower legs. She denies any fevers, shakes, chills, rashes, joint swelling, joint erythema, joint warmth, joint stiffness, trauma, or previous similar symptoms. The pain occurs 3-4 times a day and will last anywhere from 2-3 hours at a time. Tramadol has been ineffective in controlling the pain. Physical exam was notable for 11 of 18 tender points being positive on palpation. This is consistent with the diagnosis of fibromyalgia syndrome.  Plan  Although I would like to start amitriptyline I do not believe she is a good candidate for this medication. She has stress incontinence and I am concerned that the amitriptyline may cause difficulty with bladder emptying and further worsen this problem. Therefore, we decided to start gabapentin.  She will begin 300 mg by mouth at night for one week. If she is not overly tired with this medication and there may be some improvement she was asked to increase it to 300 mg twice daily. If she continues to improve on this but is sub-optimal, after one week she can increase to 300 mg 3 times daily. We will reassess the efficacy of the gabapentin at controlling her fibromyalgia symptoms at the follow-up visit. I anticipate she may require further titration of the gabapentin if she is tolerating it well but has incomplete resolution of her fibromyalgia pain.

## 2016-09-23 NOTE — Assessment & Plan Note (Addendum)
Assessment  She is had persistently elevated blood pressures over the last 2-3 visits. At home, she states her blood pressure is normal using a wrist cuff. Of note, her mother died 2 months ago after a stroke and heart attack. This concerned her and has motivated her to treat her hypertension at this time.  Plan  We will start lisinopril 10 mg by mouth daily and reassess the blood pressure at the follow-up visit in one month. We will also obtain a BMP at that time to assure that the kidney function and potassium levels are stable. She will also bring in her meter from home so we can calibrate it against our blood pressure cuffs.

## 2016-09-23 NOTE — Assessment & Plan Note (Signed)
She received the influenza vaccination today. She is otherwise up-to-date on her health care maintenance.

## 2016-09-23 NOTE — Assessment & Plan Note (Signed)
Assessment  She continues to work on her Kegel exercises which has reasonably well controlled her overflow stress urinary incontinence. She will occasionally have urinary incontinence with coughing.  Plan  She was encouraged to continue with the Kegel exercises for her overflow stress urinary incontinence. We will reassess the degree of her stress urinary incontinence at the follow-up visit.

## 2016-09-23 NOTE — Progress Notes (Signed)
   Subjective:    Patient ID: Colleen Medina, female    DOB: 01/14/1941, 75 y.o.   MRN: [id number]  HPI  Colleen Medina is here for follow-up of her diabetes, blood pressure, and chronic pain. Please see the A&P for the status of the pt's chronic medical problems.  Review of Systems  Constitutional: Negative for activity change, appetite change, chills, fever and unexpected weight change.  Respiratory: Negative for chest tightness and shortness of breath.   Cardiovascular: Positive for leg swelling. Negative for chest pain and palpitations.  Gastrointestinal: Positive for nausea. Negative for abdominal distention and vomiting.  Musculoskeletal: Positive for myalgias. Negative for arthralgias, gait problem and joint swelling.  Skin: Negative for rash and wound.      Objective:   Physical Exam  Constitutional: She is oriented to person, place, and time. She appears well-developed and well-nourished. No distress.  HENT:  Head: Normocephalic and atraumatic.  Eyes: Conjunctivae are normal. Right eye exhibits no discharge. Left eye exhibits no discharge. No scleral icterus.  Musculoskeletal: Normal range of motion. She exhibits no tenderness.  11 tender points on examination assessing for fibromyalgia  Neurological: She is alert and oriented to person, place, and time. She exhibits normal muscle tone.  Skin: Skin is warm and dry. No rash noted. She is not diaphoretic. No erythema.  Psychiatric: She has a normal mood and affect. Her behavior is normal. Judgment and thought content normal.  Nursing note and vitals reviewed.     Assessment & Plan:   Please see problem oriented charting.

## 2016-09-23 NOTE — Assessment & Plan Note (Signed)
Assessment  The thigh-high stockings also were ineffective as they would fall down. I'm concerned the measurements may have been off. This does not appear to be adversely affecting her gait and therefore does not necessarily require pharmacologic intervention.  Plan  We will hold on doing any further work on her chronic venous insufficiency at this time given the failure of both the knee-high and thigh-high compression stockings. We will reassess her symptoms and examination at the follow-up visit.

## 2016-09-23 NOTE — Assessment & Plan Note (Signed)
Assessment  Her diabetes remains well controlled on diet alone. Her hemoglobin A1c was 6.3 today. Her nausea presumably secondary to gastroparesis is slightly improved from the last visit and she is tolerating the Zofran well.  Plan  She was praised on the control of her diabetes with diet alone and asked to continue with her current dietary regimen. We will reassess the control of her diabetes at the follow-up visit with a repeat hemoglobin A1c. We will also continue to treat her diabetic gastroparesis with as needed ondansetron. This will be reassessed with regards to its efficacy at the follow-up visit.

## 2016-09-23 NOTE — Assessment & Plan Note (Signed)
Assessment  Her aortic atherosclerotic disease is asymptomatic without any signs or symptoms consistent with arterial insufficiency or claudication.  Plan  We will continue to assess for symptoms at the follow-up visit. In the meantime, we will aggressively treat her cardiovascular risk factors including her diabetes, hyperlipidemia, and essential hypertension.

## 2016-09-23 NOTE — Patient Instructions (Addendum)
It was good to see you today.  I am sorry you have all of this pain.  I think it is from something called fibromyalgia.  The good news is that there is therapy for this.  1) Start gabapentin 300 mg at night for the fibromyalgia.  The side effect is sleepiness.  If you do well with this but still are having your pain after 1 week you can increase it to 300 mg (1 capsule) twice daily.  If you do well with this but are still having pain after 1 week you can increase it to 300 mg (1 capsule) three times a day.  If at any time you get too sleepy lower the dose back to the level where you were not too sleepy.  We can make further adjustments in this depending on how you are doing when I see you again.  2) Start taking an Asprin 81 mg (baby dose) each day.  This will decrease your risk for stroke.  3) Keep taking the other medications as you are.  4) We gave you the flu shot today.  5) We checked your Hgb A1C (diabetes), cholesterol, and urine today.  I will call you next week when I get these results.  6) I started lisinopril 10 mg each night for blood pressure.  I will see you back in 1 month, sooner if necessary.

## 2016-09-23 NOTE — Assessment & Plan Note (Signed)
Assessment  She is tolerating the simvastatin 80 mg by mouth at night well without myalgias. Although repeat lipid panel is not required per the guidelines she specifically requests that it be drawn today as she is curious as to the control of her hyperlipidemia on this therapy, particularly in light of her mother's recent stroke and heart attack.  Plan  We will continue the simvastatin at 80 mg by mouth each night and reassess for intolerances at the follow-up visit. We will also follow-up on the lipid panel that was obtained at this visit and is pending at the time of this dictation.

## 2016-09-23 NOTE — Assessment & Plan Note (Signed)
Assessment  Subjectively she feels that her depression is well controlled on the Zoloft 200 mg by mouth daily.  Plan  We will continue the Zoloft at 200 mg by mouth daily and reassess her depressive symptoms at the follow-up visit.

## 2016-09-24 LAB — LIPID PANEL
CHOL/HDL RATIO: 3.6 ratio (ref 0.0–4.4)
CHOLESTEROL TOTAL: 225 mg/dL — AB (ref 100–199)
HDL: 62 mg/dL (ref >39)
LDL CALC: 151 mg/dL — AB (ref 0–99)
Triglycerides: 59 mg/dL (ref 0–149)
VLDL CHOLESTEROL CAL: 12 mg/dL (ref 5–40)

## 2016-09-24 LAB — MICROALBUMIN / CREATININE URINE RATIO
CREATININE, UR: 186.4 mg/dL
MICROALB/CREAT RATIO: 5.6 mg/g{creat} (ref 0.0–30.0)
MICROALBUM., U, RANDOM: 10.5 ug/mL

## 2016-10-03 MED ORDER — ATORVASTATIN CALCIUM 40 MG PO TABS: 40.0000 mg | ORAL_TABLET | Freq: Every day | ORAL | 3 refills | Status: DC

## 2016-10-03 NOTE — Addendum Note (Signed)
Addended by: Oval Linsey D on: 10/03/2016 03:43 PM   Modules accepted: Orders

## 2016-10-03 NOTE — Progress Notes (Signed)
Patient ID: Colleen Medina, female   DOB: 04/19/1941, 75 y.o.   MRN: [id number]  Urine creatinine 186.4 Microalbumin 10.5 Microalbumin/Creatinine 5.6  No evidence of significant microalbuminuria at this time.  Total cholesterol 225 Triglycerides 59 HDL 62 LDL 151  Lipid panel above goals on simvastatin 80 mg daily.  I called Colleen Medina and she is interested in switching to a higher potency statin.  We therefore discontinued the simvastatin and started atorvastatin 40 mg daily.  We will reassess the lipid panel on this high intensity statin at the follow-up visit.

## 2017-03-14 ENCOUNTER — Emergency Department (HOSPITAL_COMMUNITY)
Admission: EM | Admit: 2017-03-14 | Discharge: 2017-03-14 | Disposition: A | Discharge status: Home / Self Care | Payer: PPO | Attending: Emergency Medicine | Admitting: Emergency Medicine

## 2017-03-14 ENCOUNTER — Encounter (HOSPITAL_COMMUNITY): Payer: Self-pay

## 2017-03-14 ENCOUNTER — Emergency Department (HOSPITAL_COMMUNITY): Payer: PPO

## 2017-03-14 DIAGNOSIS — Z79899 Other long term (current) drug therapy: Secondary | ICD-10-CM | POA: Diagnosis not present

## 2017-03-14 DIAGNOSIS — R1110 Vomiting, unspecified: Secondary | ICD-10-CM | POA: Diagnosis not present

## 2017-03-14 DIAGNOSIS — R319 Hematuria, unspecified: Secondary | ICD-10-CM | POA: Insufficient documentation

## 2017-03-14 DIAGNOSIS — R112 Nausea with vomiting, unspecified: Secondary | ICD-10-CM

## 2017-03-14 DIAGNOSIS — R1084 Generalized abdominal pain: Secondary | ICD-10-CM | POA: Diagnosis not present

## 2017-03-14 DIAGNOSIS — R197 Diarrhea, unspecified: Secondary | ICD-10-CM | POA: Diagnosis not present

## 2017-03-14 DIAGNOSIS — Z7982 Long term (current) use of aspirin: Secondary | ICD-10-CM | POA: Insufficient documentation

## 2017-03-14 DIAGNOSIS — E119 Type 2 diabetes mellitus without complications: Secondary | ICD-10-CM | POA: Diagnosis not present

## 2017-03-14 DIAGNOSIS — I10 Essential (primary) hypertension: Secondary | ICD-10-CM | POA: Insufficient documentation

## 2017-03-14 DIAGNOSIS — R109 Unspecified abdominal pain: Secondary | ICD-10-CM | POA: Diagnosis not present

## 2017-03-14 LAB — CBC
HEMATOCRIT: 39.7 % (ref 36.0–46.0)
Hemoglobin: 12.5 g/dL (ref 12.0–15.0)
MCH: 23.9 pg — ABNORMAL LOW (ref 26.0–34.0)
MCHC: 31.5 g/dL (ref 30.0–36.0)
MCV: 76.1 fL — AB (ref 78.0–100.0)
PLATELETS: 294 10*3/uL (ref 150–400)
RBC: 5.22 MIL/uL — AB (ref 3.87–5.11)
RDW: 14.5 % (ref 11.5–15.5)
WBC: 9.9 10*3/uL (ref 4.0–10.5)

## 2017-03-14 LAB — URINALYSIS, ROUTINE W REFLEX MICROSCOPIC
Bacteria, UA: NONE SEEN
Bilirubin Urine: NEGATIVE
GLUCOSE, UA: NEGATIVE mg/dL
KETONES UR: NEGATIVE mg/dL
LEUKOCYTES UA: NEGATIVE
Nitrite: NEGATIVE
PROTEIN: NEGATIVE mg/dL
SQUAMOUS EPITHELIAL / LPF: NONE SEEN
Specific Gravity, Urine: 1.014 (ref 1.005–1.030)
pH: 7 (ref 5.0–8.0)

## 2017-03-14 LAB — LIPASE, BLOOD: LIPASE: 11 U/L (ref 11–51)

## 2017-03-14 LAB — COMPREHENSIVE METABOLIC PANEL
ALT: 23 U/L (ref 14–54)
ANION GAP: 14 (ref 5–15)
AST: 32 U/L (ref 15–41)
Albumin: 4.4 g/dL (ref 3.5–5.0)
Alkaline Phosphatase: 71 U/L (ref 38–126)
BUN: 14 mg/dL (ref 6–20)
CO2: 24 mmol/L (ref 22–32)
Calcium: 9.8 mg/dL (ref 8.9–10.3)
Chloride: 95 mmol/L — ABNORMAL LOW (ref 101–111)
Creatinine, Ser: 1.07 mg/dL — ABNORMAL HIGH (ref 0.44–1.00)
GFR, EST AFRICAN AMERICAN: 58 mL/min — AB (ref >60)
GFR, EST NON AFRICAN AMERICAN: 50 mL/min — AB (ref >60)
Glucose, Bld: 184 mg/dL — ABNORMAL HIGH (ref 65–99)
POTASSIUM: 3.9 mmol/L (ref 3.5–5.1)
Sodium: 133 mmol/L — ABNORMAL LOW (ref 135–145)
Total Bilirubin: 1.4 mg/dL — ABNORMAL HIGH (ref 0.3–1.2)
Total Protein: 8.2 g/dL — ABNORMAL HIGH (ref 6.5–8.1)

## 2017-03-14 LAB — I-STAT CG4 LACTIC ACID, ED: Lactic Acid, Venous: 1.49 mmol/L (ref 0.5–1.9)

## 2017-03-14 MED ORDER — DICYCLOMINE HCL 20 MG PO TABS: 20.0000 mg | ORAL_TABLET | Freq: Three times a day (TID) | ORAL | 0 refills | Status: DC | PRN

## 2017-03-14 MED ORDER — SODIUM CHLORIDE 0.9 % IV BOLUS (SEPSIS)
1000.0000 mL | Freq: Once | INTRAVENOUS | Status: AC
  Administered 2017-03-14: 1000 mL via INTRAVENOUS

## 2017-03-14 MED ORDER — ONDANSETRON HCL 4 MG/2ML IJ SOLN
4.0000 mg | Freq: Once | INTRAMUSCULAR | Status: AC
  Administered 2017-03-14: 4 mg via INTRAVENOUS
  Filled 2017-03-14: qty 2

## 2017-03-14 MED ORDER — MORPHINE SULFATE (PF) 4 MG/ML IV SOLN
4.0000 mg | Freq: Once | INTRAVENOUS | Status: AC
Start: 2017-03-14 — End: 2017-03-14
  Administered 2017-03-14: 4 mg via INTRAVENOUS
  Filled 2017-03-14: qty 1

## 2017-03-14 MED ORDER — MORPHINE SULFATE (PF) 4 MG/ML IV SOLN
4.0000 mg | Freq: Once | INTRAVENOUS | Status: AC
  Administered 2017-03-14: 4 mg via INTRAVENOUS
  Filled 2017-03-14: qty 1

## 2017-03-14 MED ORDER — ONDANSETRON HCL 4 MG PO TABS: 4.0000 mg | ORAL_TABLET | Freq: Three times a day (TID) | ORAL | 0 refills | Status: DC | PRN

## 2017-03-14 MED ORDER — SACCHAROMYCES BOULARDII 250 MG PO CAPS: 250.0000 mg | ORAL_CAPSULE | Freq: Two times a day (BID) | ORAL | 0 refills | Status: DC

## 2017-03-14 MED ORDER — IOPAMIDOL (ISOVUE-300) INJECTION 61%
INTRAVENOUS | Status: AC
  Administered 2017-03-14: 100 mL
  Filled 2017-03-14: qty 100
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
## 2017-03-14 NOTE — Discharge Instructions (Signed)
Read the information below.  Use the prescribed medication as directed.  Please discuss all new medications with your pharmacist.  You may return to the Emergency Department at any time for worsening condition or any new symptoms that concern you.   If you develop high fevers, worsening abdominal pain, uncontrolled vomiting, or are unable to tolerate fluids by mouth, return to the ER for a recheck.  ° °

## 2017-03-14 NOTE — ED Triage Notes (Signed)
Pt states n/v/d and abd pain that began Sunday. Pt moaning in pain. Family member says she did not want to come but has been this uncomfortable since Sunday.

## 2017-03-14 NOTE — ED Notes (Signed)
On way to CT 

## 2017-03-14 NOTE — ED Notes (Signed)
Got patient ready for discharge took patient saline lock out

## 2017-03-14 NOTE — ED Notes (Signed)
Patient given water to drink to see if able to hold fluids down.

## 2017-04-07 ENCOUNTER — Ambulatory Visit (INDEPENDENT_AMBULATORY_CARE_PROVIDER_SITE_OTHER): Payer: PPO | Admitting: Internal Medicine

## 2017-04-07 ENCOUNTER — Encounter: Payer: Self-pay | Admitting: Internal Medicine

## 2017-04-07 VITALS — BP 138/76 | HR 65 | Temp 97.8°F | Ht 59.0 in | Wt 126.6 lb

## 2017-04-07 DIAGNOSIS — E663 Overweight: Secondary | ICD-10-CM

## 2017-04-07 DIAGNOSIS — N393 Stress incontinence (female) (male): Secondary | ICD-10-CM | POA: Diagnosis not present

## 2017-04-07 DIAGNOSIS — K219 Gastro-esophageal reflux disease without esophagitis: Secondary | ICD-10-CM

## 2017-04-07 DIAGNOSIS — E1143 Type 2 diabetes mellitus with diabetic autonomic (poly)neuropathy: Secondary | ICD-10-CM

## 2017-04-07 DIAGNOSIS — K3184 Gastroparesis: Secondary | ICD-10-CM | POA: Diagnosis not present

## 2017-04-07 DIAGNOSIS — Z6825 Body mass index (BMI) 25.0-25.9, adult: Secondary | ICD-10-CM

## 2017-04-07 DIAGNOSIS — J301 Allergic rhinitis due to pollen: Secondary | ICD-10-CM

## 2017-04-07 DIAGNOSIS — J302 Other seasonal allergic rhinitis: Secondary | ICD-10-CM

## 2017-04-07 DIAGNOSIS — E785 Hyperlipidemia, unspecified: Secondary | ICD-10-CM | POA: Diagnosis not present

## 2017-04-07 DIAGNOSIS — N39490 Overflow incontinence: Secondary | ICD-10-CM | POA: Diagnosis not present

## 2017-04-07 DIAGNOSIS — I700 Atherosclerosis of aorta: Secondary | ICD-10-CM

## 2017-04-07 DIAGNOSIS — M797 Fibromyalgia: Secondary | ICD-10-CM | POA: Diagnosis not present

## 2017-04-07 DIAGNOSIS — E7849 Other hyperlipidemia: Secondary | ICD-10-CM

## 2017-04-07 DIAGNOSIS — J4520 Mild intermittent asthma, uncomplicated: Secondary | ICD-10-CM

## 2017-04-07 DIAGNOSIS — E669 Obesity, unspecified: Secondary | ICD-10-CM

## 2017-04-07 DIAGNOSIS — Z79899 Other long term (current) drug therapy: Secondary | ICD-10-CM

## 2017-04-07 DIAGNOSIS — F329 Major depressive disorder, single episode, unspecified: Secondary | ICD-10-CM | POA: Diagnosis not present

## 2017-04-07 DIAGNOSIS — I10 Essential (primary) hypertension: Secondary | ICD-10-CM

## 2017-04-07 DIAGNOSIS — K449 Diaphragmatic hernia without obstruction or gangrene: Secondary | ICD-10-CM | POA: Diagnosis not present

## 2017-04-07 DIAGNOSIS — M7550 Bursitis of unspecified shoulder: Secondary | ICD-10-CM

## 2017-04-07 DIAGNOSIS — Z0000 Encounter for general adult medical examination without abnormal findings: Secondary | ICD-10-CM

## 2017-04-07 LAB — GLUCOSE, CAPILLARY: GLUCOSE-CAPILLARY: 104 mg/dL — AB (ref 65–99)

## 2017-04-07 LAB — POCT GLYCOSYLATED HEMOGLOBIN (HGB A1C): HEMOGLOBIN A1C: 6.4

## 2017-04-07 MED ORDER — CETIRIZINE HCL 1 MG/ML PO SYRP: 10.0000 mg | ORAL_SOLUTION | Freq: Every day | ORAL | 12 refills | Status: DC

## 2017-04-07 MED ORDER — PREGABALIN 75 MG PO CAPS: 75.0000 mg | ORAL_CAPSULE | Freq: Two times a day (BID) | ORAL | 3 refills | Status: DC

## 2017-04-07 NOTE — Assessment & Plan Note (Signed)
Assessment  She does not have any signs or symptoms consistent with clinically significant claudication or peripheral vascular occlusive disease despite her underlying aortic atherosclerosis.  Plan  We will continue to aggressively treat her diabetes, hypertension, and hyperlipidemia with the high intensity statin as noted above. We will reassess for evidence of symptomatic aortic atherosclerotic disease at the follow-up visit.

## 2017-04-07 NOTE — Assessment & Plan Note (Signed)
Assessment  Her weight is up 1 pound in the last 6 months but her BMI remains under 26. Therefore with a relatively stable weight she is barely in the overweight category.  Plan  She was praised for her ability to maintain her weight and was encouraged to continue with her current lifestyle with regards to diet and exercise. We will reassess the weight at the follow-up visit.
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
## 2017-04-07 NOTE — Assessment & Plan Note (Signed)
A diabetic foot exam was completed today. She was reminded to schedule an appointment with her ophthalmologist for her diabetic retinal examination. She stated she would. She is otherwise up-to-date on her health care maintenance.

## 2017-04-07 NOTE — Assessment & Plan Note (Signed)
Assessment  She was able to escalate the gabapentin to 300 mg by mouth 3 times daily without significant sedation. Despite this she noticed absolutely no improvement in her fibromyalgia pain. She is asking for alternative therapy given this remains distressful to her.  Plan  We will discontinue the gabapentin and start Lyrica 75 mg by mouth twice daily. We will reassess the efficacy of this therapy in treating her fibromyalgia pain at the follow-up visit. If this is ineffective we will either escalate the dose or consider trying amitriptyline. We have chosen not to start amitriptyline ahead of gabapentin or Lyrica for her fibromyalgia given her overflow stress urinary incontinence and the possibility that the amitriptyline could worsen urinary retention and therefore overflow incontinence.
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
## 2017-04-07 NOTE — Assessment & Plan Note (Signed)
Assessment  Her asthma is well controlled clinically with minimal need for as needed albuterol.  Plan  We will continue to treat her mild intermittent intrinsic asthma with as needed albuterol and reassess the need for supplemental bronchodilators at the follow-up visit.

## 2017-04-07 NOTE — Assessment & Plan Note (Signed)
Assessment  She is tolerating the atorvastatin 40 mg by mouth daily well without myalgias.  Plan  We will continue with this high intensity statin and reassess for intolerances at the next visit.

## 2017-04-07 NOTE — Assessment & Plan Note (Signed)
Assessment  The subacromial bursitis on the right has resolved. Unfortunately, she now states similar symptoms consistent with subacromial bursitis on the left. She is currently not interested in injection therapy.  Plan  She was asked to take as needed Tylenol and we will reassess the left shoulder pain at the follow-up visit.

## 2017-04-07 NOTE — Assessment & Plan Note (Signed)
Assessment  She has noticed sinus congestion, sinus pressure, postnasal drip, phlegm in the morning, and a voice change. All of this has worsened over the last several weeks when the pollen counts have been going up. She is interested in therapy for her allergic rhinitis.  Plan  We have started cetirizine 10 mg by mouth daily as needed for allergic symptoms. Her insurance company will only cover the syrup formulation and therefore this was written for. I tried to contact her regarding this fact of reimbursement, but was unable to get a hold of her secondary to receiving a voicemail which had a full box. If she has any questions regarding the syrup formulation versus the tablet formulation I am hopeful she will call us so I can explain the issues regarding her insurance reimbursement. We will reassess the efficacy of this therapy in managing her symptoms at the follow-up visit.

## 2017-04-07 NOTE — Progress Notes (Signed)
   Subjective:    Patient ID: Colleen Medina, female    DOB: Aug 20, 1941, 76 y.o.   MRN: [id number]  HPI  Colleen Medina is here for follow-up of her diabetes, asthma, GERD, major depression, aortic atherosclerosis, hyperlipidemia, stress urinary incontinence, and fibromyalgia. Please see the A&P for the status of the pt's chronic medical problems.  Review of Systems  Constitutional: Negative for activity change, appetite change and unexpected weight change.  HENT: Positive for congestion, postnasal drip, rhinorrhea, sinus pressure and voice change.   Respiratory: Negative for chest tightness, shortness of breath and wheezing.   Cardiovascular: Negative for chest pain and palpitations.  Gastrointestinal: Positive for constipation. Negative for abdominal distention, abdominal pain, diarrhea, nausea and vomiting.  Musculoskeletal: Positive for arthralgias. Negative for joint swelling and myalgias.  Skin: Negative for rash and wound.  Allergic/Immunologic: Positive for environmental allergies.  Neurological: Positive for headaches.  Psychiatric/Behavioral: Positive for dysphoric mood.      Objective:   Physical Exam  Constitutional: She is oriented to person, place, and time. She appears well-developed and well-nourished. No distress.  HENT:  Head: Normocephalic and atraumatic.  Eyes: Conjunctivae are normal. Right eye exhibits no discharge. Left eye exhibits no discharge. No scleral icterus.  Cardiovascular: Normal rate, regular rhythm and normal heart sounds.  Exam reveals no gallop and no friction rub.   No murmur heard. Pulmonary/Chest: Effort normal and breath sounds normal. No respiratory distress. She has no wheezes. She has no rales.  Abdominal: Soft. Bowel sounds are normal. She exhibits no distension. There is no tenderness. There is no rebound and no guarding.  Neurological: She is alert and oriented to person, place, and time. She exhibits normal muscle tone.  Skin: Skin  is warm and dry. No rash noted. She is not diaphoretic. No erythema.  Psychiatric: She has a normal mood and affect. Her behavior is normal. Judgment and thought content normal.  Nursing note and vitals reviewed.     Assessment & Plan:   Please see problem oriented charting.

## 2017-04-07 NOTE — Assessment & Plan Note (Signed)
Assessment  Her gastroesophageal reflux disease is well controlled on the omeprazole 20 mg by mouth daily.  Plan  We will continue the omeprazole 20 mg by mouth daily and reassess the symptomatic control on this therapy at the follow-up visit.

## 2017-04-07 NOTE — Assessment & Plan Note (Signed)
Assessment  She continues to have symptoms of overflow stress urinary incontinence. She also continues to work on the Kegel exercises.  Plan  She was asked to continue to work on the Kegel exercises and we will reassess the severity of her overflow stress urinary incontinence at the follow-up visit.
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
## 2017-04-07 NOTE — Assessment & Plan Note (Signed)
Assessment  Her diabetes is well controlled today with a hemoglobin A1c of 6.4. This is on diet and exercise alone. She currently has no nausea suggesting her gastroparesis is not symptomatically acting up at this time.  Plan  She was praised for excellent diabetic control with diet and exercise alone. She was encouraged to continue with her current lifestyle to manage her diabetes. We will reassess her diabetic control at the follow-up visit with a repeat hemoglobin A1c.

## 2017-04-08 LAB — BMP8+ANION GAP
ANION GAP: 15 mmol/L (ref 10.0–18.0)
BUN/Creatinine Ratio: 20 (ref 12–28)
BUN: 15 mg/dL (ref 8–27)
CO2: 26 mmol/L (ref 18–29)
Calcium: 9 mg/dL (ref 8.7–10.3)
Chloride: 102 mmol/L (ref 96–106)
Creatinine, Ser: 0.74 mg/dL (ref 0.57–1.00)
GFR, EST AFRICAN AMERICAN: 93 mL/min/{1.73_m2} (ref >59)
GFR, EST NON AFRICAN AMERICAN: 81 mL/min/{1.73_m2} (ref >59)
Glucose: 100 mg/dL — ABNORMAL HIGH (ref 65–99)
Potassium: 4 mmol/L (ref 3.5–5.2)
Sodium: 143 mmol/L (ref 134–144)

## 2017-04-08 LAB — LIPID PANEL
Chol/HDL Ratio: 4.1 ratio (ref 0.0–4.4)
Cholesterol, Total: 236 mg/dL — ABNORMAL HIGH (ref 100–199)
HDL: 58 mg/dL (ref >39)
LDL CALC: 162 mg/dL — AB (ref 0–99)
TRIGLYCERIDES: 79 mg/dL (ref 0–149)
VLDL Cholesterol Cal: 16 mg/dL (ref 5–40)

## 2017-04-08 LAB — MICROALBUMIN / CREATININE URINE RATIO
Creatinine, Urine: 194.3 mg/dL
Microalb/Creat Ratio: 5.5 mg/g creat (ref 0.0–30.0)
Microalbumin, Urine: 10.7 ug/mL

## 2017-04-08 LAB — URINALYSIS, ROUTINE W REFLEX MICROSCOPIC
Bilirubin, UA: NEGATIVE
Glucose, UA: NEGATIVE
KETONES UA: NEGATIVE
NITRITE UA: NEGATIVE
PH UA: 5 (ref 5.0–7.5)
Protein, UA: NEGATIVE
RBC, UA: NEGATIVE
Specific Gravity, UA: 1.028 (ref 1.005–1.030)
Urobilinogen, Ur: 1 mg/dL (ref 0.2–1.0)

## 2017-04-08 LAB — MICROSCOPIC EXAMINATION: Casts: NONE SEEN /lpf

## 2017-04-10 MED ORDER — ATORVASTATIN CALCIUM 80 MG PO TABS: 80.0000 mg | ORAL_TABLET | Freq: Every day | ORAL | 3 refills | Status: DC

## 2017-04-10 NOTE — Addendum Note (Signed)
Addended by: Oval Linsey D on: 04/10/2017 10:58 AM   Modules accepted: Orders

## 2017-04-10 NOTE — Progress Notes (Signed)
Patient ID: Colleen Medina, female   DOB: 1941-01-27, 76 y.o.   MRN: [id number]  BMP: K 4.0, Cr 0.74, eGFR 93  Total cholesterol 236 Triglycerides 79 HDL 58 VLDL 16 LDL 162  10 year cardiovascular risk 38.2% (baseline 9.5%).  This result is despite the atorvastatin 40 mg daily.  I would like to increase the atorvastatin to 80 mg daily.  I called Ms. Saylor and reviewed the above information and recommendation.  She is agreeable to increasing the atorvastatin to 80 mg daily and rechecking a lipid panel at the follow-up visit.  UA SG 1.028, pH 5.0, leukocytes 2+, nitrite negative, > 10 epithelial cells, 11-30 WBC/HPF, 0-2 RBC/HPF, few bacteria.  Asymptomatic UTI, hematuria has resolved.  Unclear cause of hematuria.  Urine creatinine 194.5 Urine albumin 10.7 Microalbumin/Creatinine ratio 5.5

## 2017-05-09 ENCOUNTER — Encounter: Payer: Self-pay | Admitting: Internal Medicine

## 2017-05-09 ENCOUNTER — Telehealth: Payer: Self-pay | Admitting: *Deleted

## 2017-05-09 ENCOUNTER — Ambulatory Visit (INDEPENDENT_AMBULATORY_CARE_PROVIDER_SITE_OTHER): Payer: PPO | Admitting: Internal Medicine

## 2017-05-09 VITALS — BP 119/60 | HR 76 | Temp 98.0°F | Ht 59.0 in | Wt 128.2 lb

## 2017-05-09 DIAGNOSIS — M797 Fibromyalgia: Secondary | ICD-10-CM | POA: Diagnosis not present

## 2017-05-09 DIAGNOSIS — I872 Venous insufficiency (chronic) (peripheral): Secondary | ICD-10-CM | POA: Diagnosis not present

## 2017-05-09 DIAGNOSIS — M25512 Pain in left shoulder: Secondary | ICD-10-CM | POA: Insufficient documentation

## 2017-05-09 MED ORDER — AMITRIPTYLINE HCL 25 MG PO TABS: 25.0000 mg | ORAL_TABLET | Freq: Every day | ORAL | 2 refills | Status: DC
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
## 2017-05-09 NOTE — Telephone Encounter (Signed)
Pt with hx of Fibromyalgia and diabetes. States left arm hurts all the time which radiates to neck. Also states last night legs/feet were "very swollen" but no as bad this morning. Also having leg cramps. \ Appt given for today @ 5945 PM in [place].

## 2017-05-09 NOTE — Assessment & Plan Note (Signed)
Left shoulder pain is likely 2/2 to rotator cuff tendonitis or could be also from her fibromyalgia. Did steroid injection today.   Changed her fibromyalgia med from lyrica to Amitriptyline today.  Will assess for improvement next visit.

## 2017-05-09 NOTE — Patient Instructions (Signed)
Stop lyrica  Start amitriptyline  This may help with your fibromyalgia pain and also the shoulder pain.  Come back in 3 months.

## 2017-05-09 NOTE — Telephone Encounter (Signed)
Agree with appointment

## 2017-05-09 NOTE — Assessment & Plan Note (Signed)
Fibromyalgia sx not improved with Lyrica (chagned from gabapentin).  Stop lyrica today Start amitriptyline 25mg  qHS.

## 2017-05-09 NOTE — Assessment & Plan Note (Signed)
Has chronic leg swelling from chronic venous insufficiency.  Measured her leg today and ordered knee high compression socks.

## 2017-05-10 NOTE — Progress Notes (Signed)
I saw and evaluated the patient.  I personally confirmed the key portions of Dr. Brandt Loosen history and exam and reviewed pertinent patient test results.  The assessment, diagnosis, and plan were formulated together and I agree with the documentation in the resident's note.  I was present at the patient's side supervising the left shoulder injection in its entirety.  The patient tolerated the procedure well without acute complications.

## 2017-05-14 ENCOUNTER — Encounter (HOSPITAL_COMMUNITY): Payer: Self-pay | Admitting: Emergency Medicine

## 2017-05-14 ENCOUNTER — Emergency Department (HOSPITAL_COMMUNITY): Payer: PPO

## 2017-05-14 ENCOUNTER — Emergency Department (HOSPITAL_COMMUNITY)
Admission: EM | Admit: 2017-05-14 | Discharge: 2017-05-14 | Disposition: A | Discharge status: Home / Self Care | Payer: PPO | Attending: Emergency Medicine | Admitting: Emergency Medicine

## 2017-05-14 DIAGNOSIS — T148XXA Other injury of unspecified body region, initial encounter: Secondary | ICD-10-CM | POA: Diagnosis not present

## 2017-05-14 DIAGNOSIS — Z7982 Long term (current) use of aspirin: Secondary | ICD-10-CM | POA: Diagnosis not present

## 2017-05-14 DIAGNOSIS — E119 Type 2 diabetes mellitus without complications: Secondary | ICD-10-CM | POA: Insufficient documentation

## 2017-05-14 DIAGNOSIS — S46912A Strain of unspecified muscle, fascia and tendon at shoulder and upper arm level, left arm, initial encounter: Secondary | ICD-10-CM

## 2017-05-14 DIAGNOSIS — Y939 Activity, unspecified: Secondary | ICD-10-CM | POA: Insufficient documentation

## 2017-05-14 DIAGNOSIS — S8991XA Unspecified injury of right lower leg, initial encounter: Secondary | ICD-10-CM | POA: Diagnosis not present

## 2017-05-14 DIAGNOSIS — Z7901 Long term (current) use of anticoagulants: Secondary | ICD-10-CM | POA: Insufficient documentation

## 2017-05-14 DIAGNOSIS — I10 Essential (primary) hypertension: Secondary | ICD-10-CM | POA: Diagnosis not present

## 2017-05-14 DIAGNOSIS — S8001XA Contusion of right knee, initial encounter: Secondary | ICD-10-CM | POA: Diagnosis not present

## 2017-05-14 DIAGNOSIS — M25569 Pain in unspecified knee: Secondary | ICD-10-CM | POA: Diagnosis not present

## 2017-05-14 DIAGNOSIS — Y999 Unspecified external cause status: Secondary | ICD-10-CM | POA: Diagnosis not present

## 2017-05-14 DIAGNOSIS — M25561 Pain in right knee: Secondary | ICD-10-CM | POA: Diagnosis not present

## 2017-05-14 DIAGNOSIS — Y92410 Unspecified street and highway as the place of occurrence of the external cause: Secondary | ICD-10-CM | POA: Insufficient documentation

## 2017-05-14 DIAGNOSIS — S4992XA Unspecified injury of left shoulder and upper arm, initial encounter: Secondary | ICD-10-CM | POA: Diagnosis not present

## 2017-05-14 DIAGNOSIS — M25512 Pain in left shoulder: Secondary | ICD-10-CM | POA: Diagnosis not present
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
## 2017-06-08 ENCOUNTER — Encounter: Payer: Self-pay | Admitting: Internal Medicine

## 2017-06-08 ENCOUNTER — Ambulatory Visit (INDEPENDENT_AMBULATORY_CARE_PROVIDER_SITE_OTHER): Payer: PPO | Admitting: Internal Medicine

## 2017-06-08 VITALS — BP 113/59 | HR 68 | Temp 98.2°F | Ht 59.0 in | Wt 128.2 lb

## 2017-06-08 DIAGNOSIS — S8001XA Contusion of right knee, initial encounter: Secondary | ICD-10-CM | POA: Insufficient documentation

## 2017-06-08 DIAGNOSIS — M25512 Pain in left shoulder: Secondary | ICD-10-CM | POA: Diagnosis not present

## 2017-06-08 DIAGNOSIS — S8001XD Contusion of right knee, subsequent encounter: Secondary | ICD-10-CM

## 2017-06-08 NOTE — Patient Instructions (Signed)
It was a pleasure to see you today Colleen Medina.  I think you have bruising causing your current knee pain and swelling. This will probably continue to go down over the next 2 weeks.  You can take antiinflammatory medicine such as ibuprofen up to 600mg  once every 6 hours during these next 2 weeks. This can help reduce the swelling and soreness.  We will see you in a few months for your primary doctor follow up unless you need [place] sooner.

## 2017-06-08 NOTE — Progress Notes (Signed)
Internal Medicine Clinic Attending  Case discussed with Dr. Rice at the time of the visit.  We reviewed the resident's history and exam and pertinent patient test results.  I agree with the assessment, diagnosis, and plan of care documented in the resident's note.  

## 2017-06-08 NOTE — Assessment & Plan Note (Signed)
HPI: She reports increased shoulder pain since the ED visit as well. This bothers her at rest and also with use of her arm. She is not sure how much of this is her existing chronic shoulder pain versus a new problem.  A: Left arm pain On physical exam today the pain is more consistent with the body of the biceps muscle or distal tendinopathy due to the pain location and worsening with supination against resistance.  P: Observation and NSAIDs as needed during the next 2 weeks
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
## 2017-06-21 ENCOUNTER — Ambulatory Visit (HOSPITAL_COMMUNITY)
Admission: EM | Admit: 2017-06-21 | Discharge: 2017-06-21 | Disposition: A | Discharge status: Home / Self Care | Payer: PPO | Attending: Family Medicine | Admitting: Family Medicine

## 2017-06-21 ENCOUNTER — Encounter (HOSPITAL_COMMUNITY): Payer: Self-pay | Admitting: Emergency Medicine

## 2017-06-21 DIAGNOSIS — W57XXXA Bitten or stung by nonvenomous insect and other nonvenomous arthropods, initial encounter: Secondary | ICD-10-CM | POA: Diagnosis not present

## 2017-06-21 DIAGNOSIS — S00462A Insect bite (nonvenomous) of left ear, initial encounter: Secondary | ICD-10-CM | POA: Diagnosis not present
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
## 2017-06-21 NOTE — ED Triage Notes (Signed)
The patient presented to the [facility] with a complaint of a tick behind her left ear.

## 2017-08-22 DIAGNOSIS — M654 Radial styloid tenosynovitis [de Quervain]: Secondary | ICD-10-CM | POA: Diagnosis not present

## 2017-08-31 DIAGNOSIS — Z1231 Encounter for screening mammogram for malignant neoplasm of breast: Secondary | ICD-10-CM | POA: Diagnosis not present

## 2017-09-12 DIAGNOSIS — M654 Radial styloid tenosynovitis [de Quervain]: Secondary | ICD-10-CM | POA: Diagnosis not present

## 2017-09-27 DIAGNOSIS — E119 Type 2 diabetes mellitus without complications: Secondary | ICD-10-CM | POA: Diagnosis not present

## 2017-09-27 LAB — HM DIABETES EYE EXAM

## 2017-10-06 ENCOUNTER — Encounter: Payer: Self-pay | Admitting: *Deleted

## 2017-10-20 ENCOUNTER — Other Ambulatory Visit: Payer: Self-pay | Admitting: Internal Medicine

## 2017-12-01 ENCOUNTER — Ambulatory Visit (INDEPENDENT_AMBULATORY_CARE_PROVIDER_SITE_OTHER): Payer: PPO | Admitting: Internal Medicine

## 2017-12-01 ENCOUNTER — Encounter: Payer: Self-pay | Admitting: Internal Medicine

## 2017-12-01 VITALS — BP 145/85 | HR 68 | Temp 97.5°F | Wt 127.5 lb

## 2017-12-01 DIAGNOSIS — K449 Diaphragmatic hernia without obstruction or gangrene: Secondary | ICD-10-CM | POA: Diagnosis not present

## 2017-12-01 DIAGNOSIS — Z6825 Body mass index (BMI) 25.0-25.9, adult: Secondary | ICD-10-CM

## 2017-12-01 DIAGNOSIS — E7800 Pure hypercholesterolemia, unspecified: Secondary | ICD-10-CM

## 2017-12-01 DIAGNOSIS — K5909 Other constipation: Secondary | ICD-10-CM | POA: Diagnosis not present

## 2017-12-01 DIAGNOSIS — R252 Cramp and spasm: Secondary | ICD-10-CM | POA: Insufficient documentation

## 2017-12-01 DIAGNOSIS — M1711 Unilateral primary osteoarthritis, right knee: Secondary | ICD-10-CM | POA: Diagnosis not present

## 2017-12-01 DIAGNOSIS — J302 Other seasonal allergic rhinitis: Secondary | ICD-10-CM

## 2017-12-01 DIAGNOSIS — J301 Allergic rhinitis due to pollen: Secondary | ICD-10-CM

## 2017-12-01 DIAGNOSIS — M159 Polyosteoarthritis, unspecified: Secondary | ICD-10-CM

## 2017-12-01 DIAGNOSIS — N393 Stress incontinence (female) (male): Secondary | ICD-10-CM | POA: Diagnosis not present

## 2017-12-01 DIAGNOSIS — E1143 Type 2 diabetes mellitus with diabetic autonomic (poly)neuropathy: Secondary | ICD-10-CM

## 2017-12-01 DIAGNOSIS — E559 Vitamin D deficiency, unspecified: Secondary | ICD-10-CM

## 2017-12-01 DIAGNOSIS — I872 Venous insufficiency (chronic) (peripheral): Secondary | ICD-10-CM | POA: Diagnosis not present

## 2017-12-01 DIAGNOSIS — I10 Essential (primary) hypertension: Secondary | ICD-10-CM | POA: Diagnosis not present

## 2017-12-01 DIAGNOSIS — N39490 Overflow incontinence: Secondary | ICD-10-CM

## 2017-12-01 DIAGNOSIS — M150 Primary generalized (osteo)arthritis: Secondary | ICD-10-CM

## 2017-12-01 DIAGNOSIS — J4520 Mild intermittent asthma, uncomplicated: Secondary | ICD-10-CM

## 2017-12-01 DIAGNOSIS — Z7982 Long term (current) use of aspirin: Secondary | ICD-10-CM

## 2017-12-01 DIAGNOSIS — E669 Obesity, unspecified: Secondary | ICD-10-CM

## 2017-12-01 DIAGNOSIS — K219 Gastro-esophageal reflux disease without esophagitis: Secondary | ICD-10-CM | POA: Diagnosis not present

## 2017-12-01 DIAGNOSIS — K3184 Gastroparesis: Secondary | ICD-10-CM | POA: Diagnosis not present

## 2017-12-01 DIAGNOSIS — M797 Fibromyalgia: Secondary | ICD-10-CM

## 2017-12-01 DIAGNOSIS — Z23 Encounter for immunization: Secondary | ICD-10-CM

## 2017-12-01 DIAGNOSIS — E785 Hyperlipidemia, unspecified: Secondary | ICD-10-CM

## 2017-12-01 DIAGNOSIS — Z79899 Other long term (current) drug therapy: Secondary | ICD-10-CM

## 2017-12-01 DIAGNOSIS — E663 Overweight: Secondary | ICD-10-CM

## 2017-12-01 DIAGNOSIS — F3340 Major depressive disorder, recurrent, in remission, unspecified: Secondary | ICD-10-CM

## 2017-12-01 DIAGNOSIS — F325 Major depressive disorder, single episode, in full remission: Secondary | ICD-10-CM

## 2017-12-01 DIAGNOSIS — I700 Atherosclerosis of aorta: Secondary | ICD-10-CM | POA: Diagnosis not present

## 2017-12-01 LAB — GLUCOSE, CAPILLARY: Glucose-Capillary: 95 mg/dL (ref 65–99)

## 2017-12-01 LAB — POCT GLYCOSYLATED HEMOGLOBIN (HGB A1C): Hemoglobin A1C: 6.5

## 2017-12-01 MED ORDER — LISINOPRIL 20 MG PO TABS: 20.0000 mg | ORAL_TABLET | Freq: Every day | ORAL | 3 refills | Status: DC

## 2017-12-01 MED ORDER — TRAMADOL HCL 50 MG PO TABS: 50.0000 mg | ORAL_TABLET | Freq: Two times a day (BID) | ORAL | 0 refills | Status: DC | PRN

## 2017-12-01 MED ORDER — ASPIRIN EC 81 MG PO TBEC: 81.0000 mg | DELAYED_RELEASE_TABLET | Freq: Every day | ORAL | 3 refills | Status: DC

## 2017-12-01 NOTE — Assessment & Plan Note (Signed)
Assessment  Her chronic major depression is now in remission. She is felt so well from a mood standpoint that she self discontinued her SSRI therapy. She currently feels she does not require pharmacologic therapy. It is possible that the amitriptyline was effective in controlling her depression, although the dose is quite low.  Plan  We will continue to screen for recurrence of her major depression, but at this point monitor without the need for therapy.

## 2017-12-01 NOTE — Assessment & Plan Note (Signed)
Assessment  For the last several months she has had cramping of both the hands and feet. This can become quite painful at times. Although it does not happen daily it is a rather frequent event. The cramping will resolve spontaneously. There is no particular exacerbate her of these symptoms. The cause of the hand and feet cramping are unclear based on the history and examination.  Plan  We have obtained a basic metabolic panel, phosphorus, magnesium, TSH, and vitamin D level to assess for potential causes of these cramps. These results are pending at the time of this dictation.

## 2017-12-01 NOTE — Assessment & Plan Note (Signed)
Assessment  Since last clinic visit she has obtained the knee-high compression stockings which she is wearing. Despite this intervention she continues to have lower extremity edema from her chronic venous insufficiency.  Plan  We will continue to encourage the use of the knee-high compression stockings. We will reassess the efficacy of this therapy and managing her symptoms at the follow-up visit.

## 2017-12-01 NOTE — Assessment & Plan Note (Signed)
She received the flu vaccination today. She is otherwise up-to-date on her health care maintenance.

## 2017-12-01 NOTE — Assessment & Plan Note (Signed)
Assessment  Her diabetes is well controlled with lifestyle modifications alone. Today's hemoglobin A1c was 6.5. She was praised on her ability to control her hyperglycemia with lifestyle alone. She has not had any nausea or vomiting associated with her diabetic gastroparesis recently.  Plan  She was encouraged to continue with her lifestyle modifications. We will reassess the efficacy of these modifications with a repeat hemoglobin A1c at the follow-up visit.

## 2017-12-01 NOTE — Assessment & Plan Note (Signed)
Assessment  She has had no difficulty with symptomatic gastroesophageal reflux disease and has stopped her omeprazole. That said, she would like [place] to keep it on her medication list in case she needs to return to it.  Plan  She knows to restart the omeprazole if she were to have a return of her gastroesophageal reflux symptoms. We will reassess for symptomatic gastroesophageal reflux disease at the follow-up visit.

## 2017-12-01 NOTE — Assessment & Plan Note (Signed)
Assessment  Although she continues to have aches and pains they seem to be much less than ever before, at least since I have known her. The reason for this improvement is unclear but may have had something to do with the initiation of amitriptyline 25 mg by mouth nightly.  Plan  We will continue the amitriptyline at 25 mg by mouth nightly and reassess the efficacy of this in controlling or managing her fibromyalgia symptoms when she returns for follow-up.

## 2017-12-01 NOTE — Assessment & Plan Note (Signed)
Assessment  She continues to have chronic constipation. She now has muscle cramping in the hands and feet. This could be consistent with hypothyroidism. A TSH was drawn during this visit and is pending at the time of this dictation.  Plan  We will follow-up on the results of the TSH. If she has hypothyroidism we will initiate appropriate therapy.

## 2017-12-01 NOTE — Progress Notes (Signed)
   Subjective:    Patient ID: Colleen Medina, female    DOB: 1941/10/27, 76 y.o.   MRN: [id number]  HPI  Colleen Medina is here for follow-up of type 2 diabetes with gastroparesis, hyperlipidemia, mild intermittent asthma, osteoarthritis, chronic constipation, seasonal allergic rhinitis, major depression, chronic venous insufficiency, aortic atherosclerosis, stress urinary incontinence, fibromyalgia, essential hypertension, and hand and feet cramping. Please see the A&P for the status of the pt's chronic medical problems.  Review of Systems  Constitutional: Negative for activity change, appetite change and unexpected weight change.  HENT: Negative for congestion, postnasal drip, rhinorrhea and sinus pain.   Respiratory: Negative for chest tightness and shortness of breath.   Cardiovascular: Positive for leg swelling. Negative for chest pain and palpitations.  Gastrointestinal: Positive for constipation. Negative for abdominal distention, abdominal pain, diarrhea, nausea and vomiting.  Endocrine: Negative for cold intolerance.  Genitourinary: Negative for difficulty urinating.  Musculoskeletal: Positive for arthralgias and myalgias. Negative for gait problem.  Psychiatric/Behavioral: Negative for dysphoric mood.      Objective:   Physical Exam  Constitutional: She is oriented to person, place, and time. She appears well-developed and well-nourished. No distress.  HENT:  Head: Normocephalic and atraumatic.  Mouth/Throat: Oropharynx is clear and moist. No oropharyngeal exudate.  Eyes: Conjunctivae are normal. Right eye exhibits no discharge. Left eye exhibits no discharge. No scleral icterus.  Neck: Normal range of motion. Neck supple. No tracheal deviation present. No thyromegaly present.  Cardiovascular: Normal rate, regular rhythm and normal heart sounds. Exam reveals no gallop and no friction rub.  No murmur heard. Pulmonary/Chest: Effort normal. No stridor. No respiratory  distress.  Abdominal: Soft. Bowel sounds are normal. She exhibits no distension. There is no tenderness. There is no rebound and no guarding.  Musculoskeletal: Normal range of motion. She exhibits edema and tenderness.  Lymphadenopathy:    She has no cervical adenopathy.  Neurological: She is alert and oriented to person, place, and time. She exhibits normal muscle tone.  Skin: Skin is warm and dry. She is not diaphoretic. No erythema.  Psychiatric: She has a normal mood and affect. Her behavior is normal. Judgment and thought content normal.  Nursing note and vitals reviewed.     Assessment & Plan:   Please see problem oriented charting.

## 2017-12-01 NOTE — Assessment & Plan Note (Signed)
Assessment  She states that her stress urinary incontinence has resolved. I am unsure if the anticholinergic effects of the amitriptyline may be playing into this or the Ke[place] exercises she was doing.  Plan  We will reassess for evidence of a return of her stress urinary incontinence at the follow-up visit.

## 2017-12-01 NOTE — Assessment & Plan Note (Signed)
Assessment  Her mild intermittent asthma is very well controlled as she requires a very rare dose of albuterol.  Plan  We will continue the as needed albuterol and reassess the efficacy of this therapy and managing her symptoms at the follow-up visit.

## 2017-12-01 NOTE — Assessment & Plan Note (Signed)
Assessment  She is tolerating the atorvastatin 80 mg by mouth daily. Although she has diffuse pains this is most likely related to her fibromyalgia as it is improved despite the increased dose of the atorvastatin since the last visit. A lipid panel was drawn at this visit and is pending at the time of this dictation.  Plan  We will continue the high intensity statin and reassess for evidence of intolerances at the follow-up visit. We will also follow-up on the results of the lipid panel drawn today.

## 2017-12-01 NOTE — Patient Instructions (Addendum)
It was good to see you again.  I am sorry you are having the bad cramping.  1) We stopped the bentyl, zofran, and sertraline as you are no longer taking or needing these medications.  2) Keep taking the other medications as you are.  3) We checked a lot of labs today to figure out why you may be having the cramps.  I will call you next week with the results.  4) We gave you the flu shot today.  5) We started tramadol 50 mg every 12 hours as needed for severe pain.  I will see you in 6 months, sooner if necessary.

## 2017-12-01 NOTE — Assessment & Plan Note (Signed)
Assessment  She denies any signs or symptoms consistent with symptomatic aortic atherosclerotic disease such as claudication.  Plan  We will continue with risk factor modification including control of her hypertension, hyperlipidemia, and diabetes. She will also continue with an aspirin 81 mg by mouth daily. We will reassess for signs or symptoms suggestive of symptomatic aortic atherosclerotic disease at the follow-up visit.

## 2017-12-01 NOTE — Assessment & Plan Note (Signed)
Assessment  Her blood pressure was elevated today at 145/85. This is on lisinopril 10 mg by mouth daily.  Plan  We will increase lisinopril to 20 mg by mouth daily and reassess the efficacy of this therapy with a repeat blood pressure at the follow-up visit.

## 2017-12-01 NOTE — Assessment & Plan Note (Signed)
Assessment  She has lost an additional pound since her last visit. She was praised on her ability to maintain, and even lose some weight.  Plan  She was encouraged to continue with her lifestyle modifications in hopes of slowly losing additional weight until she reaches a normal BMI of less than 25. Of note, she is not very far from that goal.

## 2017-12-01 NOTE — Assessment & Plan Note (Signed)
Assessment  She notes continued right knee pain which is impacting upon her quality of life.  Plan  We will restart the tramadol 50 mg by mouth every 12 hours as needed dispense #60 for severe right knee osteoarthritic pain. This has been effective in the past and we will reassess its efficacy at the return visit.

## 2017-12-01 NOTE — Assessment & Plan Note (Signed)
Assessment  Her seasonal allergic rhinitis symptoms are well controlled on this cetirizine.  Plan  We will continue the cetirizine and reassess the efficacy of this therapy at the follow-up visit.

## 2017-12-02 LAB — BMP8+ANION GAP
Anion Gap: 14 mmol/L (ref 10.0–18.0)
BUN / CREAT RATIO: 19 (ref 12–28)
BUN: 15 mg/dL (ref 8–27)
CO2: 25 mmol/L (ref 20–29)
CREATININE: 0.81 mg/dL (ref 0.57–1.00)
Calcium: 9.4 mg/dL (ref 8.7–10.3)
Chloride: 102 mmol/L (ref 96–106)
GFR, EST AFRICAN AMERICAN: 83 mL/min/{1.73_m2} (ref >59)
GFR, EST NON AFRICAN AMERICAN: 72 mL/min/{1.73_m2} (ref >59)
Glucose: 106 mg/dL — ABNORMAL HIGH (ref 65–99)
POTASSIUM: 4.1 mmol/L (ref 3.5–5.2)
SODIUM: 141 mmol/L (ref 134–144)

## 2017-12-02 LAB — VITAMIN D 25 HYDROXY (VIT D DEFICIENCY, FRACTURES): Vit D, 25-Hydroxy: 10.7 ng/mL — ABNORMAL LOW (ref 30.0–100.0)

## 2017-12-02 LAB — LIPID PANEL
CHOL/HDL RATIO: 3.7 ratio (ref 0.0–4.4)
Cholesterol, Total: 239 mg/dL — ABNORMAL HIGH (ref 100–199)
HDL: 64 mg/dL (ref >39)
LDL CALC: 160 mg/dL — AB (ref 0–99)
Triglycerides: 74 mg/dL (ref 0–149)
VLDL CHOLESTEROL CAL: 15 mg/dL (ref 5–40)

## 2017-12-02 LAB — TSH: TSH: 2.12 u[IU]/mL (ref 0.450–4.500)

## 2017-12-02 LAB — PHOSPHORUS: Phosphorus: 3.5 mg/dL (ref 2.5–4.5)

## 2017-12-02 LAB — MAGNESIUM: MAGNESIUM: 2 mg/dL (ref 1.6–2.3)

## 2018-01-03 ENCOUNTER — Other Ambulatory Visit: Payer: Self-pay

## 2018-01-03 ENCOUNTER — Ambulatory Visit (INDEPENDENT_AMBULATORY_CARE_PROVIDER_SITE_OTHER): Payer: PPO | Admitting: Internal Medicine

## 2018-01-03 VITALS — BP 138/64 | HR 85 | Temp 100.2°F | Wt 123.7 lb

## 2018-01-03 DIAGNOSIS — R05 Cough: Secondary | ICD-10-CM

## 2018-01-03 DIAGNOSIS — R197 Diarrhea, unspecified: Secondary | ICD-10-CM | POA: Diagnosis not present

## 2018-01-03 DIAGNOSIS — R0981 Nasal congestion: Secondary | ICD-10-CM | POA: Diagnosis not present

## 2018-01-03 DIAGNOSIS — R51 Headache: Secondary | ICD-10-CM

## 2018-01-03 DIAGNOSIS — R509 Fever, unspecified: Secondary | ICD-10-CM | POA: Diagnosis not present

## 2018-01-03 DIAGNOSIS — J45909 Unspecified asthma, uncomplicated: Secondary | ICD-10-CM

## 2018-01-03 DIAGNOSIS — M791 Myalgia, unspecified site: Secondary | ICD-10-CM

## 2018-01-03 DIAGNOSIS — J111 Influenza due to unidentified influenza virus with other respiratory manifestations: Secondary | ICD-10-CM

## 2018-01-03 LAB — BASIC METABOLIC PANEL
ANION GAP: 8 (ref 5–15)
BUN: 13 mg/dL (ref 6–20)
CALCIUM: 8.7 mg/dL — AB (ref 8.9–10.3)
CHLORIDE: 105 mmol/L (ref 101–111)
CO2: 27 mmol/L (ref 22–32)
Creatinine, Ser: 0.92 mg/dL (ref 0.44–1.00)
GFR calc Af Amer: >60 mL/min (ref >60)
GFR calc non Af Amer: 60 mL/min — ABNORMAL LOW (ref >60)
Glucose, Bld: 123 mg/dL — ABNORMAL HIGH (ref 65–99)
Potassium: 3.7 mmol/L (ref 3.5–5.1)
SODIUM: 140 mmol/L (ref 135–145)

## 2018-01-03 MED ORDER — VITAMIN D (ERGOCALCIFEROL) 1.25 MG (50000 UNIT) PO CAPS: 50000.0000 [IU] | ORAL_CAPSULE | ORAL | 0 refills | Status: DC

## 2018-01-03 MED ORDER — OSELTAMIVIR PHOSPHATE 75 MG PO CAPS: 75.0000 mg | ORAL_CAPSULE | Freq: Two times a day (BID) | ORAL | 0 refills | Status: AC

## 2018-01-03 NOTE — Progress Notes (Signed)
Patient ID: Colleen Medina, female   DOB: 12/27/1940, 77 y.o.   MRN: [id number]  BMP: Unremarkable eGFR 83, Ca 9.4  PO4 3.5  Mg 2.0  TSH 2.12  Vitamin D 10.7  Very low vitamin D level.  Although I am not convinced this is causing the cramping in the hand, it could be causing some bony pain.  I called Ms. Joss and we decided to replete with ergocalciferol 50,000 units weekly for 8 weeks.  Three months after therapy we will repeat the vitamin D level to assure it has been adequately repleted.  If so we will start vitamin D 800 IU daily thereafter.  If it has not been adequately repleted we may again try a course of high dose vitamin D again.  Total cholesterol 239 Triglycerides 74 HDL 64 LDL 160  LDL still high despite going up on the atorvastatin to 80 mg daily.  Will consider changing to rosuvastatin at the next visit, but will hold off for now until we can get the vitamin D repleted in the setting of hand cramping.

## 2018-01-03 NOTE — Addendum Note (Signed)
Addended by: Oval Linsey D on: 01/03/2018 09:58 AM   Modules accepted: Orders
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
## 2018-01-03 NOTE — Patient Instructions (Addendum)
Nice to meet you today Colleen Medina.  Based on your symptoms and exam today, it looks like you may have the flu or a nasty virus similar to the flu.  We will treat you with a flu medicine called oseltamivir or Tamiflu that you will take for 5 days.  Otherwise, it is important to try to stay hydrated with plenty of fluids and some bland food that you can tolerate.  We will check a lab today to make sure you are not dehydrated.  For the next day or 2 you can skip your dose of lisinopril until you start feeling better and are able to eat/drink regularly. You can continue over-the-counter medicines like Mucinex, Tylenol (up to 1000 mg 3 times a day) to help with the symptoms also.  If you do not get better with time in 4 or 5 days or feel like you are getting worse after this time, please do not hesitate to call the clinic and come back in.

## 2018-01-04 DIAGNOSIS — J129 Viral pneumonia, unspecified: Secondary | ICD-10-CM | POA: Insufficient documentation

## 2018-01-04 NOTE — Assessment & Plan Note (Signed)
Patient is presenting with signs and symptoms consistent with influenza given the sudden onset of her generalized symptoms and potential fever (100.2 temperature in clinic, did not take temperature at home).  Prior flu vaccination may have been in the percentage that is not effective each year.  We will treat empirically with Tamiflu rather than pursuing influenza testing and also check BMP to ensure patient is well hydrated.  Instructed patient that she may skip 1-2 days of lisinopril until her p.o. intake increases to avoid any kidney injury.  Provided expectant management for illness course, OTC sx management, importance of hydration, and return precautions should she fail to improve in a timely manner. BMP returned wnl and no indications for change in stated plan   --Tamiflu 75 mg BID x 5 days --Symptomatic treatment

## 2018-01-06 ENCOUNTER — Ambulatory Visit (HOSPITAL_COMMUNITY)
Admission: EM | Admit: 2018-01-06 | Discharge: 2018-01-06 | Disposition: A | Discharge status: Home / Self Care | Payer: PPO | Attending: Family Medicine | Admitting: Family Medicine

## 2018-01-06 ENCOUNTER — Other Ambulatory Visit: Payer: Self-pay

## 2018-01-06 ENCOUNTER — Ambulatory Visit (INDEPENDENT_AMBULATORY_CARE_PROVIDER_SITE_OTHER): Payer: PPO

## 2018-01-06 ENCOUNTER — Encounter (HOSPITAL_COMMUNITY): Payer: Self-pay | Admitting: Emergency Medicine

## 2018-01-06 DIAGNOSIS — J181 Lobar pneumonia, unspecified organism: Secondary | ICD-10-CM

## 2018-01-06 DIAGNOSIS — R059 Cough, unspecified: Secondary | ICD-10-CM

## 2018-01-06 DIAGNOSIS — J111 Influenza due to unidentified influenza virus with other respiratory manifestations: Secondary | ICD-10-CM | POA: Diagnosis not present

## 2018-01-06 DIAGNOSIS — R05 Cough: Secondary | ICD-10-CM | POA: Diagnosis not present

## 2018-01-06 DIAGNOSIS — J189 Pneumonia, unspecified organism: Secondary | ICD-10-CM

## 2018-01-06 DIAGNOSIS — R0602 Shortness of breath: Secondary | ICD-10-CM

## 2018-01-06 MED ORDER — ACETAMINOPHEN 325 MG PO TABS
ORAL_TABLET | ORAL | Status: AC
  Filled 2018-01-06: qty 2

## 2018-01-06 MED ORDER — HYDROCODONE-HOMATROPINE 5-1.5 MG/5ML PO SYRP: 5.0000 mL | ORAL_SOLUTION | Freq: Four times a day (QID) | ORAL | 0 refills | Status: DC | PRN

## 2018-01-06 MED ORDER — ACETAMINOPHEN 325 MG PO TABS
650.0000 mg | ORAL_TABLET | Freq: Once | ORAL | Status: AC
  Administered 2018-01-06: 650 mg via ORAL

## 2018-01-06 MED ORDER — AZITHROMYCIN 250 MG PO TABS: 250.0000 mg | ORAL_TABLET | Freq: Every day | ORAL | 0 refills | Status: DC

## 2018-01-06 NOTE — ED Triage Notes (Signed)
Onset of symptoms Tuesday night.  Saw her pcp on Wednesday and was started on tamiflu. Patient has been coughing up dark yellow, thick phlegm.  Patient complains of fever blisters and general aches
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
## 2018-01-06 NOTE — Discharge Instructions (Signed)
Be aware, your cough medication may cause drowsiness. Please do not drive, operate heavy machinery or make important decisions while on this medication, it can cloud your judgement.  Please follow up with your primary doctor this week. If worsening you may return here or the ED.

## 2018-01-10 NOTE — Progress Notes (Signed)
Internal Medicine Clinic Attending  Case discussed with Dr. Harden at the time of the visit.  We reviewed the resident's history and exam and pertinent patient test results.  I agree with the assessment, diagnosis, and plan of care documented in the resident's note.  

## 2018-01-11 ENCOUNTER — Ambulatory Visit (INDEPENDENT_AMBULATORY_CARE_PROVIDER_SITE_OTHER): Payer: PPO | Admitting: Internal Medicine

## 2018-01-11 ENCOUNTER — Inpatient Hospital Stay (HOSPITAL_COMMUNITY)
Admission: AD | Admit: 2018-01-11 | Discharge: 2018-01-14 | DRG: 202 | Disposition: A | Discharge status: Home / Self Care | Payer: PPO | Source: Ambulatory Visit | Attending: Student in an Organized Health Care Education/Training Program | Admitting: Student in an Organized Health Care Education/Training Program

## 2018-01-11 ENCOUNTER — Other Ambulatory Visit: Payer: Self-pay

## 2018-01-11 DIAGNOSIS — J4520 Mild intermittent asthma, uncomplicated: Secondary | ICD-10-CM

## 2018-01-11 DIAGNOSIS — Z882 Allergy status to sulfonamides status: Secondary | ICD-10-CM

## 2018-01-11 DIAGNOSIS — Z8249 Family history of ischemic heart disease and other diseases of the circulatory system: Secondary | ICD-10-CM

## 2018-01-11 DIAGNOSIS — Z833 Family history of diabetes mellitus: Secondary | ICD-10-CM

## 2018-01-11 DIAGNOSIS — Z9842 Cataract extraction status, left eye: Secondary | ICD-10-CM | POA: Diagnosis not present

## 2018-01-11 DIAGNOSIS — J302 Other seasonal allergic rhinitis: Secondary | ICD-10-CM | POA: Diagnosis present

## 2018-01-11 DIAGNOSIS — Z79899 Other long term (current) drug therapy: Secondary | ICD-10-CM

## 2018-01-11 DIAGNOSIS — D500 Iron deficiency anemia secondary to blood loss (chronic): Secondary | ICD-10-CM | POA: Diagnosis not present

## 2018-01-11 DIAGNOSIS — G44229 Chronic tension-type headache, not intractable: Secondary | ICD-10-CM | POA: Diagnosis not present

## 2018-01-11 DIAGNOSIS — J208 Acute bronchitis due to other specified organisms: Secondary | ICD-10-CM | POA: Diagnosis not present

## 2018-01-11 DIAGNOSIS — Z825 Family history of asthma and other chronic lower respiratory diseases: Secondary | ICD-10-CM

## 2018-01-11 DIAGNOSIS — Z9049 Acquired absence of other specified parts of digestive tract: Secondary | ICD-10-CM

## 2018-01-11 DIAGNOSIS — M797 Fibromyalgia: Secondary | ICD-10-CM | POA: Diagnosis not present

## 2018-01-11 DIAGNOSIS — E876 Hypokalemia: Secondary | ICD-10-CM | POA: Diagnosis not present

## 2018-01-11 DIAGNOSIS — K219 Gastro-esophageal reflux disease without esophagitis: Secondary | ICD-10-CM | POA: Diagnosis present

## 2018-01-11 DIAGNOSIS — J9811 Atelectasis: Secondary | ICD-10-CM | POA: Diagnosis not present

## 2018-01-11 DIAGNOSIS — I872 Venous insufficiency (chronic) (peripheral): Secondary | ICD-10-CM | POA: Diagnosis not present

## 2018-01-11 DIAGNOSIS — R0602 Shortness of breath: Secondary | ICD-10-CM | POA: Diagnosis not present

## 2018-01-11 DIAGNOSIS — Z880 Allergy status to penicillin: Secondary | ICD-10-CM

## 2018-01-11 DIAGNOSIS — E119 Type 2 diabetes mellitus without complications: Secondary | ICD-10-CM | POA: Diagnosis not present

## 2018-01-11 DIAGNOSIS — M8580 Other specified disorders of bone density and structure, unspecified site: Secondary | ICD-10-CM | POA: Diagnosis not present

## 2018-01-11 DIAGNOSIS — J4521 Mild intermittent asthma with (acute) exacerbation: Secondary | ICD-10-CM | POA: Diagnosis not present

## 2018-01-11 DIAGNOSIS — Z8349 Family history of other endocrine, nutritional and metabolic diseases: Secondary | ICD-10-CM

## 2018-01-11 DIAGNOSIS — E785 Hyperlipidemia, unspecified: Secondary | ICD-10-CM | POA: Diagnosis not present

## 2018-01-11 DIAGNOSIS — I10 Essential (primary) hypertension: Secondary | ICD-10-CM | POA: Diagnosis not present

## 2018-01-11 DIAGNOSIS — R0982 Postnasal drip: Secondary | ICD-10-CM | POA: Diagnosis present

## 2018-01-11 DIAGNOSIS — J129 Viral pneumonia, unspecified: Secondary | ICD-10-CM

## 2018-01-11 DIAGNOSIS — J069 Acute upper respiratory infection, unspecified: Secondary | ICD-10-CM | POA: Diagnosis not present

## 2018-01-11 DIAGNOSIS — Z9841 Cataract extraction status, right eye: Secondary | ICD-10-CM

## 2018-01-11 DIAGNOSIS — I700 Atherosclerosis of aorta: Secondary | ICD-10-CM | POA: Diagnosis not present

## 2018-01-11 DIAGNOSIS — H6121 Impacted cerumen, right ear: Secondary | ICD-10-CM | POA: Diagnosis present

## 2018-01-11 DIAGNOSIS — J45909 Unspecified asthma, uncomplicated: Secondary | ICD-10-CM | POA: Diagnosis not present

## 2018-01-11 DIAGNOSIS — J45901 Unspecified asthma with (acute) exacerbation: Secondary | ICD-10-CM | POA: Diagnosis present

## 2018-01-11 DIAGNOSIS — Z961 Presence of intraocular lens: Secondary | ICD-10-CM | POA: Diagnosis present

## 2018-01-11 DIAGNOSIS — D509 Iron deficiency anemia, unspecified: Secondary | ICD-10-CM | POA: Diagnosis not present

## 2018-01-11 LAB — GLUCOSE, CAPILLARY
GLUCOSE-CAPILLARY: 103 mg/dL — AB (ref 65–99)
GLUCOSE-CAPILLARY: 161 mg/dL — AB (ref 65–99)
Glucose-Capillary: 92 mg/dL (ref 65–99)

## 2018-01-11 MED ORDER — IPRATROPIUM-ALBUTEROL 0.5-2.5 (3) MG/3ML IN SOLN
3.0000 mL | RESPIRATORY_TRACT | Status: DC
  Administered 2018-01-11: 3 mL via RESPIRATORY_TRACT
  Filled 2018-01-11: qty 3

## 2018-01-11 MED ORDER — IPRATROPIUM BROMIDE 0.02 % IN SOLN
0.5000 mg | Freq: Once | RESPIRATORY_TRACT | Status: AC
  Administered 2018-01-11: 0.5 mg via RESPIRATORY_TRACT

## 2018-01-11 MED ORDER — AMITRIPTYLINE HCL 50 MG PO TABS
25.0000 mg | ORAL_TABLET | Freq: Every day | ORAL | Status: DC
  Administered 2018-01-11 – 2018-01-13 (×3): 25 mg via ORAL
  Filled 2018-01-11 (×3): qty 1

## 2018-01-11 MED ORDER — ATORVASTATIN CALCIUM 80 MG PO TABS
80.0000 mg | ORAL_TABLET | Freq: Every day | ORAL | Status: DC
  Administered 2018-01-11 – 2018-01-14 (×4): 80 mg via ORAL
  Filled 2018-01-11 (×4): qty 1

## 2018-01-11 MED ORDER — ENOXAPARIN SODIUM 40 MG/0.4ML ~~LOC~~ SOLN
40.0000 mg | SUBCUTANEOUS | Status: DC
  Administered 2018-01-11 – 2018-01-13 (×3): 40 mg via SUBCUTANEOUS
  Filled 2018-01-11 (×3): qty 0.4

## 2018-01-11 MED ORDER — ALBUTEROL SULFATE (2.5 MG/3ML) 0.083% IN NEBU: 2.5000 mg | INHALATION_SOLUTION | RESPIRATORY_TRACT | Status: DC | PRN

## 2018-01-11 MED ORDER — ASPIRIN EC 81 MG PO TBEC
81.0000 mg | DELAYED_RELEASE_TABLET | Freq: Every day | ORAL | Status: DC
  Administered 2018-01-11 – 2018-01-14 (×4): 81 mg via ORAL
  Filled 2018-01-11 (×4): qty 1

## 2018-01-11 MED ORDER — SODIUM CHLORIDE 3 % IN NEBU
4.0000 mL | INHALATION_SOLUTION | Freq: Every day | RESPIRATORY_TRACT | Status: AC
  Administered 2018-01-12 – 2018-01-14 (×3): 4 mL via RESPIRATORY_TRACT
  Filled 2018-01-11 (×3): qty 4

## 2018-01-11 MED ORDER — IPRATROPIUM-ALBUTEROL 0.5-2.5 (3) MG/3ML IN SOLN: 3.0000 mL | RESPIRATORY_TRACT | Status: DC

## 2018-01-11 MED ORDER — LORATADINE 10 MG PO TABS
10.0000 mg | ORAL_TABLET | Freq: Every day | ORAL | Status: DC
  Administered 2018-01-12 – 2018-01-14 (×3): 10 mg via ORAL
  Filled 2018-01-11 (×3): qty 1

## 2018-01-11 MED ORDER — IPRATROPIUM-ALBUTEROL 0.5-2.5 (3) MG/3ML IN SOLN
3.0000 mL | Freq: Four times a day (QID) | RESPIRATORY_TRACT | Status: DC
  Administered 2018-01-12 (×2): 3 mL via RESPIRATORY_TRACT
  Filled 2018-01-11 (×2): qty 3

## 2018-01-11 MED ORDER — ALBUTEROL SULFATE (2.5 MG/3ML) 0.083% IN NEBU
2.5000 mg | INHALATION_SOLUTION | Freq: Once | RESPIRATORY_TRACT | Status: AC
  Administered 2018-01-11: 2.5 mg via RESPIRATORY_TRACT

## 2018-01-11 MED ORDER — BENZONATATE 100 MG PO CAPS
200.0000 mg | ORAL_CAPSULE | Freq: Three times a day (TID) | ORAL | Status: DC
  Administered 2018-01-11 – 2018-01-14 (×9): 200 mg via ORAL
  Filled 2018-01-11 (×9): qty 2

## 2018-01-11 MED ORDER — GUAIFENESIN ER 600 MG PO TB12
600.0000 mg | ORAL_TABLET | Freq: Two times a day (BID) | ORAL | Status: DC
  Administered 2018-01-11 – 2018-01-14 (×6): 600 mg via ORAL
  Filled 2018-01-11 (×6): qty 1

## 2018-01-11 MED ORDER — FLUTICASONE PROPIONATE 50 MCG/ACT NA SUSP
1.0000 | Freq: Every day | NASAL | Status: DC
  Administered 2018-01-11 – 2018-01-14 (×4): 1 via NASAL
  Filled 2018-01-11 (×2): qty 16

## 2018-01-11 MED ORDER — PREDNISONE 20 MG PO TABS
40.0000 mg | ORAL_TABLET | Freq: Every day | ORAL | Status: DC
  Administered 2018-01-12: 40 mg via ORAL
  Filled 2018-01-11: qty 2

## 2018-01-11 MED ORDER — CETIRIZINE HCL 1 MG/ML PO SYRP: 10.0000 mg | ORAL_SOLUTION | Freq: Every day | ORAL | Status: DC

## 2018-01-11 MED ORDER — IPRATROPIUM-ALBUTEROL 0.5-2.5 (3) MG/3ML IN SOLN: 3.0000 mL | Freq: Three times a day (TID) | RESPIRATORY_TRACT | Status: DC

## 2018-01-11 NOTE — Progress Notes (Signed)
Colleen Medina is a 77 y.o. female patient, a direct admission from home awake, alert - oriented  X 4 - no acute distress noted.  VSS - Blood pressure 122/63, pulse 84, temperature 98.3 F (36.8 C), temperature source Oral, resp. rate (!) 32, height 4\' 11"  (1.499 m), weight 55.4 kg (122 lb 2.2 oz), SpO2 100 %.    IV in place, occlusive dsg intact without redness.  Orientation to room, and floor completed with information packet given to patient/family.  Patient declined safety video at this time.  Admission INP armband ID verified with patient/family, and in place.   SR up x 2, fall assessment complete, with patient and family able to verbalize understanding of risk associated with falls, and verbalized understanding to call nsg before up out of bed.  Call light within reach, patient able to voice, and demonstrate understanding.  Skin, clean-dry- intact without evidence of bruising, or skin tears.   No evidence of skin break down noted on exam.     Will cont to eval and treat per MD orders.  Dorris Carnes, RN 01/11/2018 6:01 PM

## 2018-01-11 NOTE — Patient Instructions (Signed)
Admission

## 2018-01-11 NOTE — Assessment & Plan Note (Signed)
Patient presents with persistent upper respiratory symptoms with severe cough and dyspnea.  She was diagnosed with pneumonia on chest x-ray which on our review is fairly subtle.  This may be the continuation of a viral illness rather than a true bacterial pneumonia that has failed antibiotic treatment.  On exam, she has increased work of breathing with periods of intense coughing and decreased air movement.  She is maintaining oxygen saturation and with stable vital signs.  Given her age and degree of tachypnea associated with cough, we will admit for observation.

## 2018-01-11 NOTE — Progress Notes (Signed)
Report called to Irepi, RN on [address].  Patient has eating lunch and has had a Neb treatment as well.  Accompanied by daughter.  Is alert and oriented.  Transferred to [facility] via wheelchair.  Sander Nephew, RN 01/11/2018 4:29 PM.
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
## 2018-01-11 NOTE — Progress Notes (Signed)
Pt arrived to the unit at 1730 via bed.
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
## 2018-01-12 DIAGNOSIS — J45909 Unspecified asthma, uncomplicated: Secondary | ICD-10-CM

## 2018-01-12 DIAGNOSIS — I10 Essential (primary) hypertension: Secondary | ICD-10-CM

## 2018-01-12 DIAGNOSIS — Z79899 Other long term (current) drug therapy: Secondary | ICD-10-CM

## 2018-01-12 DIAGNOSIS — E119 Type 2 diabetes mellitus without complications: Secondary | ICD-10-CM

## 2018-01-12 DIAGNOSIS — K219 Gastro-esophageal reflux disease without esophagitis: Secondary | ICD-10-CM

## 2018-01-12 DIAGNOSIS — J069 Acute upper respiratory infection, unspecified: Secondary | ICD-10-CM

## 2018-01-12 DIAGNOSIS — J208 Acute bronchitis due to other specified organisms: Secondary | ICD-10-CM

## 2018-01-12 DIAGNOSIS — D500 Iron deficiency anemia secondary to blood loss (chronic): Secondary | ICD-10-CM

## 2018-01-12 LAB — CBC
HEMATOCRIT: 29.2 % — AB (ref 36.0–46.0)
HEMOGLOBIN: 8.9 g/dL — AB (ref 12.0–15.0)
MCH: 24.1 pg — AB (ref 26.0–34.0)
MCHC: 30.5 g/dL (ref 30.0–36.0)
MCV: 78.9 fL (ref 78.0–100.0)
Platelets: 268 10*3/uL (ref 150–400)
RBC: 3.7 MIL/uL — ABNORMAL LOW (ref 3.87–5.11)
RDW: 13.9 % (ref 11.5–15.5)
WBC: 4.2 10*3/uL (ref 4.0–10.5)

## 2018-01-12 LAB — BASIC METABOLIC PANEL
ANION GAP: 11 (ref 5–15)
BUN: 14 mg/dL (ref 6–20)
CO2: 25 mmol/L (ref 22–32)
Calcium: 8.4 mg/dL — ABNORMAL LOW (ref 8.9–10.3)
Chloride: 104 mmol/L (ref 101–111)
Creatinine, Ser: 0.94 mg/dL (ref 0.44–1.00)
GFR calc Af Amer: >60 mL/min (ref >60)
GFR calc non Af Amer: 58 mL/min — ABNORMAL LOW (ref >60)
GLUCOSE: 186 mg/dL — AB (ref 65–99)
POTASSIUM: 3.1 mmol/L — AB (ref 3.5–5.1)
Sodium: 140 mmol/L (ref 135–145)

## 2018-01-12 LAB — GLUCOSE, CAPILLARY
GLUCOSE-CAPILLARY: 217 mg/dL — AB (ref 65–99)
Glucose-Capillary: 107 mg/dL — ABNORMAL HIGH (ref 65–99)
Glucose-Capillary: 243 mg/dL — ABNORMAL HIGH (ref 65–99)

## 2018-01-12 MED ORDER — ACETAMINOPHEN 325 MG PO TABS
650.0000 mg | ORAL_TABLET | Freq: Four times a day (QID) | ORAL | Status: DC | PRN
  Administered 2018-01-12 (×2): 650 mg via ORAL
  Filled 2018-01-12 (×2): qty 2

## 2018-01-12 MED ORDER — IPRATROPIUM-ALBUTEROL 0.5-2.5 (3) MG/3ML IN SOLN
3.0000 mL | RESPIRATORY_TRACT | Status: DC
  Administered 2018-01-12 – 2018-01-14 (×14): 3 mL via RESPIRATORY_TRACT
  Filled 2018-01-12 (×14): qty 3

## 2018-01-12 MED ORDER — PREDNISONE 50 MG PO TABS
60.0000 mg | ORAL_TABLET | Freq: Every day | ORAL | Status: DC
  Administered 2018-01-13 – 2018-01-14 (×2): 60 mg via ORAL
  Filled 2018-01-12 (×2): qty 1

## 2018-01-12 MED ORDER — METHYLPREDNISOLONE SODIUM SUCC 40 MG IJ SOLR
40.0000 mg | Freq: Once | INTRAMUSCULAR | Status: AC
  Administered 2018-01-12: 40 mg via INTRAVENOUS
  Filled 2018-01-12: qty 1

## 2018-01-12 MED ORDER — POTASSIUM CHLORIDE CRYS ER 20 MEQ PO TBCR
40.0000 meq | EXTENDED_RELEASE_TABLET | Freq: Once | ORAL | Status: AC
  Administered 2018-01-12: 40 meq via ORAL
  Filled 2018-01-12: qty 2

## 2018-01-12 NOTE — Progress Notes (Signed)
   Subjective:  Patient seen laying in bed this AM in no acute distress. Patient appeared to be breathing a little more comfortably today, but still not able to speak in full sentences. Patient states that she has a headache but no other acute complaints  Objective:  Vital signs in last 24 hours: Vitals:   01/12/18 0033 01/12/18 0622 01/12/18 0849 01/12/18 0851  BP:  118/64    Pulse: 92 65    Resp: 19 18    Temp:  98.3 F (36.8 C)    TempSrc:  Oral    SpO2: 96% 97% 96% 96%  Weight:      Height:       Physical Exam  Constitutional: She appears well-developed and well-nourished. No distress.  Cardiovascular: Normal rate, regular rhythm and intact distal pulses.  Respiratory:  Patient not speaking in full sentences. Mild accessory muscle use. No nasal flaring. Intermittent scattered wheezing without focal crackles, slightly improved from yesterday's exam.  GI: Soft. She exhibits no distension. There is no tenderness.  Musculoskeletal: She exhibits no edema (of bilateral lower extremities) or tenderness (of bilateral lower extremities).  Skin: Skin is warm and dry. No rash noted. No erythema.   Assessment/Plan:  Active Problems:   Acute asthma exacerbation  Colleen Medina is a 77 yo with PMH of HTN, GERD, diet controlled T2DM, asthma and seasonal allergies who is presenting with a 9 day history of cough, SOB, fevers, and malaise. She has been treated for influenza with Tamiflu and atypical pneumonia with azithromycin since the onset of her symptoms. Patient's dry cough and physical exam are most consistent with post-viral bronchitis. The patient was admitted to the internal medicine teaching service for management. The specific problems addressed during admission are as follows.  Post viral bronchitis: Patient appears to be breathing slightly more comfortably today, but still signs of mild respiratory distress at rest which worsens with intermittent coughing. Patient's exam with mild  wheezing when not coughing. Will increase steroids today since this provided her some relief overnight and continue frequent, scheduled breathing treatments overnight. Will reassess in AM -Scheduled DuoNebs q4 hours PRN, albuterol q4 hours PRN -Increase daily prednisone to 60 mg, will discharge with taper -Continue supportive care for cough, fever, and headache  Hypertension: Patient's BP on arrival 172/72. Currently normotensive on home lisinopril. -Lisinopril 20 mg  Chronic microcytic anemia: Patient's Hgb today around 9.0, a decrease from baseline of No current signs or symptoms of bleeding. Will order iron studies and continue to monitor. -F/u iron studies  FEN/GI: -Carb Modified -No IVF, replace electrolytes as needed (K+ today) -Omeprazole 20 mg  VTE Prophylaxis: Lovenox daily Code Status: Full  Dispo: Anticipated discharge in approximately 1-2 day(s).   Thomasene Ripple, MD 01/12/2018, 2:54 PM Pager: [phone]

## 2018-01-12 NOTE — Progress Notes (Signed)
RT NOTE:  Flutter given. Education provided. Pt preformed x10 with good effort.

## 2018-01-12 NOTE — Progress Notes (Signed)
  Date: 01/12/2018  Patient name: Colleen Medina  Medical record number: [id number]  Date of birth: 1941/06/09   I have seen and evaluated Colleen Medina and discussed their care with the Residency Team.  In brief, patient is a 77 year old female with a past medical history of hypertension, GERD, diet-controlled diabetes, asthma who presented with worsening cough and shortness of breath has been progressive over the last 9-10 days.  Patient states that approximately 9-10 days ago he developed sudden onset of cough and shortness of breath.  Cough was productive of yellowish phlegm initially but is now become more dry.  She had associated symptoms of headache, sinus congestion and intermittent nausea and vomiting associated with general malaise.  Also complained of some associated diarrhea approximately 2 loose bowel movements a day since the start of her symptoms.  Patient was initially evaluated at [facility] and was started on Tamiflu for possible flu.  Patient's symptoms persisted despite this is going to a local urgent care for evaluation and was thought to have an atypical pneumonia and given a 5-day course of azithromycin.  Patient states that despite this her symptoms do not resolved she was referred to the hospital from the internal medicine clinic.  No chest pain, no palpitations, diaphoresis, no lightheadedness, no focal weakness, no headache, no blurry vision.  Patient does complain of intermittent subjective fevers and chills.  Today patient states that she feels a little better but still complaining of persistent cough and shortness of breath.   PMHx, Fam Hx, and/or Soc Hx : As per resident admit note  Vitals:   01/12/18 0849 01/12/18 0851  BP:    Pulse:    Resp:    Temp:    SpO2: 96% 96%   General: Awake alert and oriented x3, NAD CVS: Regular rate and rhythm, normal heart sounds Lungs: CTA bilaterally, no wheezes or crackles noted, patient still with difficulty speaking in full  sentences and had multiple coughing episodes during exam Abdomen: Soft, nontender, nondistended, normoactive bowel sounds Extremities: No edema noted  Assessment and Plan: I have seen and evaluated the patient as outlined above. I agree with the formulated Assessment and Plan as detailed in the residents' note, with the following changes:   1.  Likely acute asthma exacerbation in the setting of a viral URI: -Patient presented with progressive cough and shortness of breath over the last 9-10 days.  Given her history of asthma and wheezing noted on initial examination I suspect that patient had a recent viral infection that exacerbated her asthma leading to her persistent cough and wheezing. -She has completed a course of antibiotics with azithromycin.  No need for further antibiotic treatment at this time -Chest x-ray with no evidence of pneumonia -Continue with scheduled duo nebs every 4 hours -Continue with prednisone.  Would give an additional dose of IV Solu-Medrol 40 mg IV x1 today and increase her prednisone dose to 60 mg tomorrow and start a slow taper of prednisone -Continue with Tessalon Perles for cough -We will continue to monitor as an inpatient today.  If she continues to improve may be discharged home tomorrow  Aldine Contes, MD 1/25/20192:18 PM

## 2018-01-13 LAB — CBC
HCT: 31.9 % — ABNORMAL LOW (ref 36.0–46.0)
Hemoglobin: 9.6 g/dL — ABNORMAL LOW (ref 12.0–15.0)
MCH: 23.8 pg — AB (ref 26.0–34.0)
MCHC: 30.1 g/dL (ref 30.0–36.0)
MCV: 79 fL (ref 78.0–100.0)
PLATELETS: 315 10*3/uL (ref 150–400)
RBC: 4.04 MIL/uL (ref 3.87–5.11)
RDW: 14.1 % (ref 11.5–15.5)
WBC: 11.7 10*3/uL — ABNORMAL HIGH (ref 4.0–10.5)

## 2018-01-13 LAB — BASIC METABOLIC PANEL
Anion gap: 10 (ref 5–15)
BUN: 12 mg/dL (ref 6–20)
CHLORIDE: 106 mmol/L (ref 101–111)
CO2: 23 mmol/L (ref 22–32)
CREATININE: 0.88 mg/dL (ref 0.44–1.00)
Calcium: 9 mg/dL (ref 8.9–10.3)
GFR calc non Af Amer: >60 mL/min (ref >60)
Glucose, Bld: 163 mg/dL — ABNORMAL HIGH (ref 65–99)
Potassium: 4.2 mmol/L (ref 3.5–5.1)
SODIUM: 139 mmol/L (ref 135–145)

## 2018-01-13 LAB — FERRITIN: Ferritin: 123 ng/mL (ref 11–307)

## 2018-01-13 MED ORDER — POTASSIUM CHLORIDE CRYS ER 20 MEQ PO TBCR
60.0000 meq | EXTENDED_RELEASE_TABLET | Freq: Once | ORAL | Status: AC
  Administered 2018-01-13: 60 meq via ORAL
  Filled 2018-01-13: qty 3

## 2018-01-13 NOTE — Progress Notes (Signed)
   Subjective: Colleen Medina says she is feeling much better than the day of admission.  She still having some coughing but this has improved significantly with Mucinex and Tessalon.  We talked about over-the-counter recommendations for this cough.  She feels that her breathing is improved and closer to baseline. She says that she feels ready for discharge today, she says that her daughter is in [place] for the day but will be back this evening and her sister will be able to stay with her until her daughter returns.  Objective:  Vital signs in last 24 hours: Vitals:   01/12/18 2320 01/13/18 0310 01/13/18 0447 01/13/18 0736  BP:   120/65   Pulse:   83   Resp:   17   Temp:   98.1 F (36.7 C)   TempSrc:   Oral   SpO2: 98% 99% 100% 96%  Weight:      Height:       General: Well-appearing, no acute distress Cardiac: Regular rate and rhythm, no apparent murmurs appreciated, no peripheral edema Pulmonary: minimally coughing, Speaking in broken sentences, fine bibasilar rhonchi, no wheezing Gastrointestinal: bowel sounds present, nondistended, nontender  Assessment/Plan:  Colleen Medina is a 77 yo with PMH of HTN, GERD, diet controlled T2DM, asthma and seasonal allergies who is presenting with a 9 day history of cough, SOB, fevers, and malaise.She has been treated for influenza with Tamiflu and atypical pneumonia with azithromycin since the onset of her symptoms. Patient's dry cough and physical exam are most consistent with asthma exacerbation post-viral bronchitis.     Acute asthma exacerbation Breathing more comfortable today when compared with the time of admission.  She is coughing much less frequently on exam today and has no wheezing. Yesterday prednisone was increased.  - continue duoneb scheduled q4 h today and prednisone 60 mg daily - will send in refill request for albuterol inhaler at the time of discharge  - follow up in acute care clinic advised for 1/31 or sooner if symptoms  worsen  -We challenge pulse ox while ambulating today, if she is feeling okay she may leave this afternoon -Will plan for a 1-2 week prednisone course at discharge   HTN  Blood pressure remains well controlled ( 120/64) this morning on home medications.  - continue lisinopril 20 mg qd   Chronic microcytic anemia  Hemoglobin 8.9 yesterday, this was a change baseline is 10-11. No signs or symptoms of acute blood loss. Ferritin 123 is not consistent with iron deficiency.  - will recheck hgb prior to discharge   Hypokalemia  K 3.1 yesterday, replaced with 40 meq yesterday, added 60 meq today.  - Recheck BMP    Dispo: Anticipated discharge in approximately today or tomorrow   Colleen Noss, MD 01/13/2018, 11:32 AM Pager: [phone]

## 2018-01-13 NOTE — Progress Notes (Signed)
Colleen Medina was admitted for persistent cough and SOB. She is alert and oriented.  Cough and SOB noted with activity like walking to the bathroom.   Treatment continues per Dr;'s order no sign of distress noted. Will continue to monitor

## 2018-01-13 NOTE — Progress Notes (Signed)
MD notified of the patient was Short of Breath during ambulation and not being able to ambulate far. MD requested for me to notify the patient that she does not want to send the patient home today. Patient was notified and understood.

## 2018-01-13 NOTE — Progress Notes (Signed)
SATURATION QUALIFICATIONS: (This note is used to comply with regulatory documentation for home oxygen)  Patient Saturations on Room Air at Rest = 90%  Patient Saturations on Room Air while Ambulating = 95%  Patient Saturations on 1 Liters of oxygen while Ambulating = 94%  Please briefly explain why patient needs home oxygen: Patient is very short of breath when ambulating and also had some pain. She began coughing when walking for a longer distance.

## 2018-01-13 NOTE — Progress Notes (Addendum)
Orthstatic VS were done. Patient's blood pressure slightly dropped.

## 2018-01-14 ENCOUNTER — Inpatient Hospital Stay (HOSPITAL_COMMUNITY): Payer: PPO

## 2018-01-14 DIAGNOSIS — J45901 Unspecified asthma with (acute) exacerbation: Secondary | ICD-10-CM

## 2018-01-14 DIAGNOSIS — Z882 Allergy status to sulfonamides status: Secondary | ICD-10-CM

## 2018-01-14 DIAGNOSIS — J9811 Atelectasis: Secondary | ICD-10-CM

## 2018-01-14 DIAGNOSIS — Z880 Allergy status to penicillin: Secondary | ICD-10-CM

## 2018-01-14 MED ORDER — FLUTICASONE PROPIONATE 50 MCG/ACT NA SUSP: 1.0000 | Freq: Every day | NASAL | 0 refills | Status: AC

## 2018-01-14 MED ORDER — ALBUTEROL SULFATE HFA 108 (90 BASE) MCG/ACT IN AERS: 1.0000 | INHALATION_SPRAY | Freq: Four times a day (QID) | RESPIRATORY_TRACT | 3 refills | Status: DC | PRN

## 2018-01-14 MED ORDER — BENZONATATE 200 MG PO CAPS: 200.0000 mg | ORAL_CAPSULE | Freq: Three times a day (TID) | ORAL | 0 refills | Status: DC

## 2018-01-14 MED ORDER — PREDNISONE 20 MG PO TABS: ORAL_TABLET | ORAL | 0 refills | Status: AC

## 2018-01-14 MED ORDER — GUAIFENESIN ER 600 MG PO TB12: 600.0000 mg | ORAL_TABLET | Freq: Two times a day (BID) | ORAL | 0 refills | Status: DC

## 2018-01-14 NOTE — Progress Notes (Signed)
PT Cancellation Note  Patient Details Name: Colleen Medina MRN: [id number] DOB: 1941/04/21   Cancelled Treatment:    Reason Eval/Treat Not Completed: Patient at procedure or test/unavailable. Noted pt to DC home today, however unsure if PT will be able to return later today to complete evaluation.    Melvern Banker 01/14/2018, 2:50 PM Lavonia Dana, [place] 01/14/2018

## 2018-01-14 NOTE — Progress Notes (Signed)
SATURATION QUALIFICATIONS: (This note is used to comply with regulatory documentation for home oxygen)  Patient Saturations on Room Air at Rest = 97%  Patient Saturations on Room Air while Ambulating = 90%  Patient Saturations on 2 Liters of oxygen while Ambulating = 91%  Please briefly explain why patient needs home oxygen: Patient does not need oxygen her saturation levels were good however she became very short of breath, she only walked a short distance before feeling very weak and needing to turn around. We almost had to pull a chair up behind her. She slowly made it back to room but felt weak. Now she is wheezing and coughing. She felt a little dizzy.

## 2018-01-14 NOTE — Progress Notes (Signed)
[name] to be D/C'd Home per MD order.  Discussed with the patient and all questions fully answered.  VSS, Skin clean, dry and intact without evidence of skin break down, no evidence of skin tears noted. IV catheter discontinued intact. Site without signs and symptoms of complications. Dressing and pressure applied.  An After Visit Summary was printed and given to the patient. Patient received prescription.  D/c education completed with patient/family including follow up instructions, medication list, d/c activities limitations if indicated, with other d/c instructions as indicated by MD - patient able to verbalize understanding, all questions fully answered.   Patient instructed to return to ED, call 911, or call MD for any changes in condition.   Patient escorted via [place], and D/C home via private auto.  [name] 01/14/2018 4:54 PM

## 2018-01-14 NOTE — Discharge Summary (Signed)
Name: Colleen Medina MRN: [id number] DOB: 1941/04/25 77 y.o. PCP: Oval Linsey, MD  Date of Admission: 01/11/2018  4:55 PM Date of Discharge: 01/14/2018 Attending Physician: Axel Filler, *  Discharge Diagnosis: Active Problems:   Acute asthma exacerbation   Discharge Medications: Allergies as of 01/14/2018      Reactions   Sulfonamide Derivatives Anaphylaxis   Penicillins Hives   All over the body      Medication List    STOP taking these medications   HYDROcodone-homatropine 5-1.5 MG/5ML syrup Commonly known as:  HYCODAN     TAKE these medications   acetaminophen 500 MG tablet Commonly known as:  TYLENOL Take 1,000 mg by mouth every 6 (six) hours as needed for mild pain or headache.   albuterol 108 (90 Base) MCG/ACT inhaler Commonly known as:  PROAIR HFA Inhale 1-2 puffs into the lungs every 6 (six) hours as needed for shortness of breath.   amitriptyline 25 MG tablet Commonly known as:  ELAVIL Take 1 tablet (25 mg total) by mouth at bedtime.   aspirin EC 81 MG tablet Take 1 tablet (81 mg total) by mouth daily.   atorvastatin 80 MG tablet Commonly known as:  LIPITOR Take 1 tablet (80 mg total) by mouth daily.   benzonatate 200 MG capsule Commonly known as:  TESSALON Take 1 capsule (200 mg total) by mouth 3 (three) times daily.   cetirizine 1 MG/ML syrup Commonly known as:  ZYRTEC Take 10 mLs (10 mg total) by mouth daily.   fluticasone 50 MCG/ACT nasal spray Commonly known as:  FLONASE Place 1 spray into both nostrils daily. Start taking on:  01/15/2018   guaiFENesin 600 MG 12 hr tablet Commonly known as:  MUCINEX Take 1 tablet (600 mg total) by mouth 2 (two) times daily.   lisinopril 20 MG tablet Commonly known as:  PRINIVIL,ZESTRIL Take 1 tablet (20 mg total) by mouth daily.   nystatin cream Commonly known as:  MYCOSTATIN Apply 1 application topically 4 (four) times daily. To roof of mouth   omeprazole 20 MG capsule Commonly  known as:  PRILOSEC Take 1 capsule (20 mg total) by mouth daily.   predniSONE 20 MG tablet Commonly known as:  DELTASONE Take 3 tablets (60 mg total) by mouth daily with breakfast for 3 days, THEN 2 tablets (40 mg total) daily with breakfast for 3 days, THEN 1 tablet (20 mg total) daily with breakfast for 3 days, THEN 0.5 tablets (10 mg total) daily with breakfast for 3 days. Start taking on:  01/15/2018   traMADol 50 MG tablet Commonly known as:  ULTRAM Take 1 tablet (50 mg total) by mouth every 12 (twelve) hours as needed for severe pain.   triamcinolone cream 0.1 % Commonly known as:  KENALOG Apply 1 application topically 4 (four) times daily. To roof of mouth   Vitamin D (Ergocalciferol) 50000 units Caps capsule Commonly known as:  DRISDOL Take 1 capsule (50,000 Units total) by mouth every 7 (seven) days.       Disposition and follow-up:   Colleen Medina was discharged from [facility] in Good condition.  At the hospital follow up visit please address:  1.  Asthma - If patients symptom frequency continues after resolution of this acute event she may qualify for mild persistent asthma and benefit from using inhaled glucocorticoids.   2.  Labs / imaging needed at time of follow-up: CBC  3.  Pending labs/ test needing follow-up: none  Follow-up Appointments: Follow-up Information    [place]. Schedule an appointment as soon as possible for a visit in 4 day(s).   Contact information: [address] [place] [facility] Course by problem list: Active Problems:   Acute asthma exacerbation Ms. Herandez's symptoms began 1/16 one week prior to admission. She presented to the [facility] with concern for the sudden onset of productive cough, myalgia, headache, chills, congestion, and diarrhea. In the clinic she had a temp of 100.2 and was actively coughing. Cecause her symptoms were  consistent with flu, she was given a prescription for a course of tamiflu. She presented to the urgent care on 1/19 with continuation of these symptoms. At the urgent care a chest xray was obtained and thought to be consistent with multifocal bronchopneumonia so she was given a prescription for azithromycin and hycodan. She again presented to the clinic on 1/24 for continuation of the cough which had since become dry and accompanied by wheezing, the myalgia had resolved. In the [facility] she was noted to be in respiratory distress because her ability to speak in full sentences and take deep inspirations was limited by the nagging cough. She had trying using an albuterol treatment once at home and felt that this had not helped her symptosm. IMTS was called for admission and started scheduled duonebs treatments, prednisone, and guifenicin. Her home medication cetirizine was continued daily and daily flonase was added. Symptoms were slow to respond but on the second day of admission the wheezing had resolved and cough was improving. She attempted to ambulate on the second day of admission but felt short of breath while ambulating for a short distance so she was asked to stay an additional night. On the morning of discharge persistent bibasilar rhonchi were noted but her work of breathing had improve and coughing had almost completely resolved. A chest xray was obtained and showed atelectasis so incentive spirometry teaching was provided. She expressed that she did not want to have help at home from nursing or aids and had enough support from family that someone could stay with her if needed. She was evaluated by physical therapy and found that she was back to her baseline aside from the cough and was walking without difficulty. She was discharged with a two week prednisone taper and a refill on her home albuterol inhaler. At follow up or when post viral cough has resolved she will need to be evaluated for possible  progression of her asthma.    Discharge Vitals:   BP 127/64 (BP Location: Left Arm)   Pulse 90   Temp 98.1 F (36.7 C) (Oral)   Resp (!) 28   Ht 4\' 11"  (1.499 m)   Wt 121 lb 7.6 oz (55.1 kg)   SpO2 96%   BMI 24.53 kg/m   Pertinent Labs, Studies, and Procedures:  Chest xray 12/2017 Cardiac shadow remains enlarged. The lungs are well aerated bilaterally. Minimal patchy atelectatic changes are noted in the right base. No sizable effusion is seen. Degenerative changes of the thoracic spine are noted.  Discharge Instructions: Discharge Instructions    Call MD for:  difficulty breathing, headache or visual disturbances   Complete by:  As directed    Call MD for:  extreme fatigue   Complete by:  As directed    Call MD for:  temperature >100.4   Complete by:  As directed    Diet -  low sodium heart healthy   Complete by:  As directed    Increase activity slowly   Complete by:  As directed       Signed: Ledell Noss, MD 01/14/2018, 4:23 PM   Pager: [phone]

## 2018-01-14 NOTE — Discharge Instructions (Signed)
Colleen Medina,   You were hospitalized for an asthma exacerbation, we think that your asthma symptoms became worse when you caught a common cold bug. Thank you for allowing [place] to take care of you.  Please be sure to take these medications:  Prednisone 60 mg daily for 3 days then 40 mg daily for 3 days then 20 mg daily for 3 days then 10 mg daily for 3 days  Start using this flonase inhaler  Continue taking zyrtec daily  Use cough drops and mucinex as needed  Practice the incentive spirometry breathing treatment to keep your lungs open Schedule a follow up appointment in the internal medicine clinic for Wednesday or Thursday of this week  If you continue to need the albuterol inhaler multiple times weekly we may need to send you for lung function testing   Call the internal medicine clinic if theres anything we can help you with, our phone number is [phone] and there is always a doctor available if you have a question

## 2018-01-14 NOTE — Evaluation (Signed)
Physical Therapy Evaluation Patient Details Name: Colleen Medina MRN: [id number] DOB: 1941/07/28 Today's Date: 01/14/2018   History of Present Illness  Colleen Medina is a 77 yo with PMH of HTN, GERD, diet controlled T2DM, asthma . Pt admitted for bronchitis.  Clinical Impression  Pt admitted with above diagnosis. Pt feels she is at her baseline aside from the cough she currently has.  She was able to walk without difficulty and no assistive device required. She reports she limits her activity at home when necessary based on her SOB or fibromyalgia.  She does not feel she needs follow up PT and I agree with that.  Encouraged pt to remain active and do activity as tolerated.  Pt will have some additional assistance when she first discharges which we discussed would be helpful. I have answered all patient's question regarding PT and mobility.     No follow up PT or equipment needed.    Follow Up Recommendations No PT follow up;Supervision - Intermittent    Equipment Recommendations  None recommended by PT    Recommendations for Other Services       Precautions / Restrictions Precautions Precautions: Fall Restrictions Weight Bearing Restrictions: No      Mobility  Bed Mobility Overal bed mobility: Independent                Transfers Overall transfer level: Independent Equipment used: None                Ambulation/Gait Ambulation/Gait assistance: Independent Ambulation Distance (Feet): 60 Feet Assistive device: None Gait Pattern/deviations: Step-through pattern;Decreased stride length     General Gait Details: no balance losses with gait. able to make multiple turns and lean outside BOS  Stairs            Wheelchair Mobility    Modified Rankin (Stroke Patients Only)       Balance Overall balance assessment: No apparent balance deficits (not formally assessed)(has one fall recently (tripped over a box))                                           Pertinent Vitals/Pain Pain Assessment: No/denies pain    Home Living Family/patient expects to be discharged to:: Private residence Living Arrangements: Other relatives(sister and daughter can assist, but normally lives alone) Available Help at Discharge: Family;Available PRN/intermittently Type of Home: House Home Access: Stairs to enter   Entrance Stairs-Number of Steps: 3 Home Layout: One level Home Equipment: Grab bars - tub/shower;Shower seat;Cane - single point;Walker - 2 wheels Additional Comments: pt has a lot of equipment that is her husband's (he is now in SNF)    Prior Function Level of Independence: Independent with assistive device(s)         Comments: modifies activity in accordance with her symptoms     Hand Dominance        Extremity/Trunk Assessment   Upper Extremity Assessment Upper Extremity Assessment: Overall WFL for tasks assessed    Lower Extremity Assessment Lower Extremity Assessment: Overall WFL for tasks assessed    Cervical / Trunk Assessment Cervical / Trunk Assessment: Normal  Communication   Communication: No difficulties  Cognition Arousal/Alertness: Awake/alert Behavior During Therapy: WFL for tasks assessed/performed Overall Cognitive Status: Within Functional Limits for tasks assessed  General Comments General comments (skin integrity, edema, etc.): no family present    Exercises     Assessment/Plan    PT Assessment Patent does not need any further PT services  PT Problem List         PT Treatment Interventions      PT Goals (Current goals can be found in the Care Plan section)  Acute Rehab PT Goals Patient Stated Goal: to return home today    Frequency     Barriers to discharge        Co-evaluation               AM-PAC PT "6 Clicks" Daily Activity  Outcome Measure Difficulty turning over in bed (including adjusting  bedclothes, sheets and blankets)?: None Difficulty moving from lying on back to sitting on the side of the bed? : None Difficulty sitting down on and standing up from a chair with arms (e.g., wheelchair, bedside commode, etc,.)?: None Help needed moving to and from a bed to chair (including a wheelchair)?: None Help needed walking in hospital room?: None Help needed climbing 3-5 steps with a railing? : A Little 6 Click Score: 23    End of Session   Activity Tolerance: Patient limited by fatigue Patient left: in chair;with call bell/phone within reach   PT Visit Diagnosis: Unsteadiness on feet (R26.81)    Time: 1540-1600 PT Time Calculation (min) (ACUTE ONLY): 20 min   Charges:   PT Evaluation $PT Eval Low Complexity: 1 Low     PT G CodesLavonia Dana, PT  [phone] 01/14/2018   Melvern Banker 01/14/2018, 4:09 PM

## 2018-01-14 NOTE — Progress Notes (Addendum)
   Subjective: Ms. Hoeger is feeling better today when compared to the time of admission. She still does not feel back to her baseline self, still feeling short of breath at times. She feels that her coughing has improved. Her daughter who lives very close to her home is now back in town and may be able to stay with her until her breathing improves.   Objective:  Vital signs in last 24 hours: Vitals:   01/13/18 2316 01/14/18 0350 01/14/18 0541 01/14/18 0720  BP:   127/68   Pulse:   83   Resp:   18   Temp:   98.6 F (37 C)   TempSrc:   Oral   SpO2: 98% 98% 96% 97%  Weight:      Height:       General: no acute distress  Cardiac: RRR, no murmur appreciated, no peripheral edema, no calf tnederness  Pulm: speaking in broken sentences, no wheezing, bibasilar rhonchi  GI: soft, non tender, non distended   Assessment/Plan:  Milady Fleener is a 77 yo with PMH of HTN, GERD, diet controlled T2DM, asthma and seasonal allergies who is presenting with a 9 day history of cough, SOB, fevers, and malaise.She has been treated for influenza with Tamiflu and atypical pneumonia with azithromycin since the onset of her symptoms. Patient's dry cough and physical exam are most consistent withasthma exacerbation post-viral bronchitis.     Acute asthma exacerbation Ms. Bucklin continues to have shortness of breath today. Exam is consistent with yesterday, no wheezing but she is speaking in broken sentences and has bibasilar rhonchi. She had difficulty walking very far in her room before developing shortness of breath. She has G1DD with normal EF on echocardiogram in 2015. Im questioning if pulmonary edema may be contributing to her persistent symptoms and will obtain a 2 view chest xray. She has said that she feels ready to discharge home, if chest xray doesn't reveal a cause of the dyspnea will plan for a long taper of prednisone (2 weeks total) and close follow up in [facility]  - Question if symptoms may have  worsened to mild persistent asthma as she describes having symptoms about 3 times weekly and frequently at night, I am not able to see PFTs in this system - This morning Ms. Wojcicki walked around the room with her nurse, she said she was able to shower and that she can arrange for her sister who does not work to stay with her until she gets stronger, she would prefer not to have outside help in her home but is open to meeting with therapy this afternoon  - CXR showed atelectasis without vascular congestion, effusion, or consolidation, I have placed an order for incentive spirometry which she should continue at home  - will arrange for her to follow up in clinic mid week   HTN  Blood pressure remains well controlled ( 127/68) this morning on home medications.  - continue lisinopril 20 mg qd   Chronic microcytic anemia  Hemoglobin 9.6 today, this is closer to baseline is 10-11. No signs or symptoms of acute blood loss. Ferritin 123 is not consistent with iron deficiency.    Dispo: Anticipated discharge today  Ledell Noss, MD 01/14/2018, 11:25 AM Pager: [phone]

## 2018-01-15 NOTE — Consult Note (Signed)
           [facility] CM Primary Care Navigator  01/15/2018  Colleen Medina [birth year] [id number]   Attemptto seepatient at the bedsideto identify possible discharge needs but she was alreadydischarged.  Per chart review, patient wasseen due to sudden onset of productive cough, wheezing, headache, fever, chills, congestion, and diarrhea. She was treated for acute asthma exacerbation.  Patient was discharged homeover the weekend.  Patient has discharge instruction to follow-up withprimary care provider and schedule an appointment as soon as possible for a visit in 4 days to follow-up asthma.   For questions, please contact:  Dannielle Huh, BSN, RN- [facility] Primary Care Navigator  Telephone: [phone] [place]

## 2018-01-19 NOTE — Progress Notes (Signed)
Medicine attending: I personally interviewed and examined this patient on the day of the patient visit and reviewed pertinent clinical ,laboratory, and radiographic data  with resident physician Dr. Tawny Asal and we discussed a management plan. Persistent, intense, spasmodic, coughing, lasting now over 2 weeks. Likely viral pneumonitis but worse despite initial Tamiflu & subsequent Azithromycin to cover possibility of bacterial superinfection. She could not stop coughing long enough for either one of [place] to examine her. She lives alone. Daughter here but works. We are concerned she will get hypoxic, have syncope during a coughing episode. We will arrange for hospital admission for steroids and symptomatic care.

## 2018-01-24 ENCOUNTER — Ambulatory Visit (INDEPENDENT_AMBULATORY_CARE_PROVIDER_SITE_OTHER): Payer: PPO | Admitting: Internal Medicine

## 2018-01-24 VITALS — BP 144/76 | HR 67 | Temp 98.0°F | Wt 125.0 lb

## 2018-01-24 DIAGNOSIS — J45901 Unspecified asthma with (acute) exacerbation: Secondary | ICD-10-CM

## 2018-01-24 DIAGNOSIS — I10 Essential (primary) hypertension: Secondary | ICD-10-CM

## 2018-01-24 NOTE — Assessment & Plan Note (Signed)
Patient's blood pressure during this visit was 144/76, 67.  The patient is currently taking lisinopril 20 mg daily.

## 2018-01-24 NOTE — Patient Instructions (Addendum)
It was a pleasure to see you today Ms. Wuebker. Please make the following changes:  -continue to take all your medication. You should complete your prednisone medication on 01/27/18.  -Please drink warm tea and use saline nasal spray to help clear for your congestion  If you have any questions or concerns, please call our clinic at [phone] between 9am-5pm and after hours call [phone] and ask for the internal medicine resident on call. If you feel you are having a medical emergency please call 911.   Thank you, we look forward to help you remain healthy!  Lars Mage, MD Internal Medicine PGY1  FOLLOW-UP INSTRUCTIONS When: May 2019 with Dr. Eppie Gibson For: Annual visit What to bring: glucose meter, medications to review

## 2018-01-24 NOTE — Progress Notes (Signed)
Internal Medicine Clinic Attending  Case discussed with Dr. Chundi at the time of the visit.  We reviewed the resident's history and exam and pertinent patient test results.  I agree with the assessment, diagnosis, and plan of care documented in the resident's note. 
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
## 2018-01-24 NOTE — Assessment & Plan Note (Signed)
Patient was recently admitted from 01/11/2018 to 01/14/18 for acute asthma exacerbation which was thought to be secondary to upper respiratory infection. The patient was treated for the flu and also found to have focal bronchopneumonia for which she was treated with 5 day course of azithromycin and Hycodan. She was treated with prednisone and breathing treatments.  Patient is supposed to have completed his own taper on 01/27/2018.  During her visit today the patient appears comfortable, without any difficulty breathing, or chest pain.  Patient saturating well at 98%. The patient states that she has been taking prednisone daily as instructed.  The patient has had 2 doses of albuterol nebulizer and 2 doses of albuterol inhaler daily.  The patient states that she still has some chest congestion and feels that she has some difficulty swallowing occasionally because of mucus plugging.  -Patient was encouraged to finish dose of prednisone taper -Patient was encouraged to drink warm liquids, use nasal spray as an expectorant to break up the mucus -The patient was instructed to return to clinic in May 2019 unless she has continued symptoms

## 2018-01-26 ENCOUNTER — Other Ambulatory Visit: Payer: Self-pay

## 2018-01-26 MED ORDER — BENZONATATE 200 MG PO CAPS: 200.0000 mg | ORAL_CAPSULE | Freq: Three times a day (TID) | ORAL | 1 refills | Status: DC

## 2018-01-26 NOTE — Telephone Encounter (Signed)
benzonatate (TESSALON) 200 MG capsule  REFILL REQUEST.

## 2018-01-26 NOTE — Telephone Encounter (Signed)
Stated she taken all Tessalon cap - requesting another refill ; states she a lot of mucous. Thanks

## 2018-02-09 ENCOUNTER — Other Ambulatory Visit: Payer: Self-pay | Admitting: *Deleted

## 2018-02-09 NOTE — Telephone Encounter (Signed)
I have not refilled this medication as her cough should be improving at this point in her illness.  I am also unsure if she is requesting a refill because she thinks she should still be taking the medication or if this is an automatic refill request from the [facility].  Regardless, I need more information as to why it needs to be refilled again before refilling it.

## 2018-02-09 NOTE — Telephone Encounter (Signed)
Pt states she still has congestion and hoarseness. Suggested scheduling an appt to be eval. Stated if she's not better by Monday, she will call back.

## 2018-05-04 ENCOUNTER — Telehealth: Payer: Self-pay | Admitting: *Deleted

## 2018-05-04 ENCOUNTER — Observation Stay (HOSPITAL_COMMUNITY)
Admission: AD | Admit: 2018-05-04 | Discharge: 2018-05-05 | Disposition: A | Discharge status: Home / Self Care | Payer: PPO | Source: Ambulatory Visit | Attending: Internal Medicine | Admitting: Internal Medicine

## 2018-05-04 ENCOUNTER — Ambulatory Visit: Payer: PPO

## 2018-05-04 ENCOUNTER — Ambulatory Visit (HOSPITAL_COMMUNITY)
Admission: RE | Admit: 2018-05-04 | Discharge: 2018-05-04 | Disposition: A | Discharge status: Home / Self Care | Payer: PPO | Source: Ambulatory Visit | Attending: Internal Medicine | Admitting: Internal Medicine

## 2018-05-04 ENCOUNTER — Other Ambulatory Visit: Payer: PPO

## 2018-05-04 VITALS — BP 189/88 | HR 71 | Temp 98.5°F

## 2018-05-04 DIAGNOSIS — M1711 Unilateral primary osteoarthritis, right knee: Secondary | ICD-10-CM | POA: Insufficient documentation

## 2018-05-04 DIAGNOSIS — Z880 Allergy status to penicillin: Secondary | ICD-10-CM | POA: Insufficient documentation

## 2018-05-04 DIAGNOSIS — A059 Bacterial foodborne intoxication, unspecified: Principal | ICD-10-CM | POA: Insufficient documentation

## 2018-05-04 DIAGNOSIS — K529 Noninfective gastroenteritis and colitis, unspecified: Secondary | ICD-10-CM

## 2018-05-04 DIAGNOSIS — R112 Nausea with vomiting, unspecified: Secondary | ICD-10-CM | POA: Diagnosis present

## 2018-05-04 DIAGNOSIS — K521 Toxic gastroenteritis and colitis: Secondary | ICD-10-CM | POA: Diagnosis present

## 2018-05-04 DIAGNOSIS — J45909 Unspecified asthma, uncomplicated: Secondary | ICD-10-CM | POA: Diagnosis not present

## 2018-05-04 DIAGNOSIS — Z833 Family history of diabetes mellitus: Secondary | ICD-10-CM

## 2018-05-04 DIAGNOSIS — M797 Fibromyalgia: Secondary | ICD-10-CM | POA: Diagnosis not present

## 2018-05-04 DIAGNOSIS — E785 Hyperlipidemia, unspecified: Secondary | ICD-10-CM | POA: Diagnosis not present

## 2018-05-04 DIAGNOSIS — G44229 Chronic tension-type headache, not intractable: Secondary | ICD-10-CM

## 2018-05-04 DIAGNOSIS — K449 Diaphragmatic hernia without obstruction or gangrene: Secondary | ICD-10-CM | POA: Diagnosis not present

## 2018-05-04 DIAGNOSIS — F329 Major depressive disorder, single episode, unspecified: Secondary | ICD-10-CM | POA: Diagnosis not present

## 2018-05-04 DIAGNOSIS — I10 Essential (primary) hypertension: Secondary | ICD-10-CM | POA: Insufficient documentation

## 2018-05-04 DIAGNOSIS — Z8249 Family history of ischemic heart disease and other diseases of the circulatory system: Secondary | ICD-10-CM | POA: Diagnosis not present

## 2018-05-04 DIAGNOSIS — E1143 Type 2 diabetes mellitus with diabetic autonomic (poly)neuropathy: Secondary | ICD-10-CM | POA: Insufficient documentation

## 2018-05-04 DIAGNOSIS — K3184 Gastroparesis: Secondary | ICD-10-CM | POA: Insufficient documentation

## 2018-05-04 DIAGNOSIS — Z9049 Acquired absence of other specified parts of digestive tract: Secondary | ICD-10-CM

## 2018-05-04 DIAGNOSIS — M8580 Other specified disorders of bone density and structure, unspecified site: Secondary | ICD-10-CM | POA: Diagnosis not present

## 2018-05-04 DIAGNOSIS — Z79899 Other long term (current) drug therapy: Secondary | ICD-10-CM | POA: Diagnosis not present

## 2018-05-04 DIAGNOSIS — Z882 Allergy status to sulfonamides status: Secondary | ICD-10-CM | POA: Insufficient documentation

## 2018-05-04 DIAGNOSIS — E872 Acidosis: Secondary | ICD-10-CM | POA: Diagnosis not present

## 2018-05-04 DIAGNOSIS — I872 Venous insufficiency (chronic) (peripheral): Secondary | ICD-10-CM | POA: Diagnosis not present

## 2018-05-04 DIAGNOSIS — M19041 Primary osteoarthritis, right hand: Secondary | ICD-10-CM | POA: Insufficient documentation

## 2018-05-04 DIAGNOSIS — R197 Diarrhea, unspecified: Principal | ICD-10-CM

## 2018-05-04 DIAGNOSIS — I700 Atherosclerosis of aorta: Secondary | ICD-10-CM | POA: Diagnosis not present

## 2018-05-04 DIAGNOSIS — R1084 Generalized abdominal pain: Secondary | ICD-10-CM | POA: Diagnosis present

## 2018-05-04 DIAGNOSIS — E559 Vitamin D deficiency, unspecified: Secondary | ICD-10-CM | POA: Insufficient documentation

## 2018-05-04 DIAGNOSIS — Z7982 Long term (current) use of aspirin: Secondary | ICD-10-CM | POA: Diagnosis not present

## 2018-05-04 DIAGNOSIS — Z83438 Family history of other disorder of lipoprotein metabolism and other lipidemia: Secondary | ICD-10-CM

## 2018-05-04 LAB — CBC WITH DIFFERENTIAL/PLATELET
Abs Immature Granulocytes: 0 10*3/uL (ref 0.0–0.1)
BASOS PCT: 0 %
Basophils Absolute: 0 10*3/uL (ref 0.0–0.1)
EOS ABS: 0 10*3/uL (ref 0.0–0.7)
EOS PCT: 0 %
HEMATOCRIT: 36.7 % (ref 36.0–46.0)
Hemoglobin: 11.1 g/dL — ABNORMAL LOW (ref 12.0–15.0)
IMMATURE GRANULOCYTES: 1 %
Lymphocytes Relative: 14 %
Lymphs Abs: 0.9 10*3/uL (ref 0.7–4.0)
MCH: 23.5 pg — AB (ref 26.0–34.0)
MCHC: 30.2 g/dL (ref 30.0–36.0)
MCV: 77.8 fL — AB (ref 78.0–100.0)
MONOS PCT: 3 %
Monocytes Absolute: 0.2 10*3/uL (ref 0.1–1.0)
NEUTROS PCT: 82 %
Neutro Abs: 4.9 10*3/uL (ref 1.7–7.7)
PLATELETS: 296 10*3/uL (ref 150–400)
RBC: 4.72 MIL/uL (ref 3.87–5.11)
RDW: 13.8 % (ref 11.5–15.5)
WBC: 5.9 10*3/uL (ref 4.0–10.5)

## 2018-05-04 LAB — COMPREHENSIVE METABOLIC PANEL
ALK PHOS: 79 U/L (ref 38–126)
ALT: 20 U/L (ref 14–54)
ANION GAP: 13 (ref 5–15)
AST: 27 U/L (ref 15–41)
Albumin: 4.3 g/dL (ref 3.5–5.0)
BUN: 9 mg/dL (ref 6–20)
CALCIUM: 10 mg/dL (ref 8.9–10.3)
CO2: 25 mmol/L (ref 22–32)
Chloride: 97 mmol/L — ABNORMAL LOW (ref 101–111)
Creatinine, Ser: 0.89 mg/dL (ref 0.44–1.00)
GFR calc Af Amer: >60 mL/min (ref >60)
Glucose, Bld: 165 mg/dL — ABNORMAL HIGH (ref 65–99)
POTASSIUM: 3.8 mmol/L (ref 3.5–5.1)
Sodium: 135 mmol/L (ref 135–145)
TOTAL PROTEIN: 8 g/dL (ref 6.5–8.1)
Total Bilirubin: 1.5 mg/dL — ABNORMAL HIGH (ref 0.3–1.2)

## 2018-05-04 LAB — GLUCOSE, CAPILLARY
Glucose-Capillary: 115 mg/dL — ABNORMAL HIGH (ref 65–99)
Glucose-Capillary: 115 mg/dL — ABNORMAL HIGH (ref 65–99)

## 2018-05-04 LAB — LACTIC ACID, PLASMA
LACTIC ACID, VENOUS: 2.2 mmol/L — AB (ref 0.5–1.9)
Lactic Acid, Venous: 1.1 mmol/L (ref 0.5–1.9)

## 2018-05-04 LAB — LIPASE, BLOOD: Lipase: 22 U/L (ref 11–51)

## 2018-05-04 MED ORDER — ACETAMINOPHEN 650 MG RE SUPP: 650.0000 mg | Freq: Four times a day (QID) | RECTAL | Status: DC | PRN

## 2018-05-04 MED ORDER — TRAMADOL HCL 50 MG PO TABS
50.0000 mg | ORAL_TABLET | Freq: Two times a day (BID) | ORAL | Status: DC | PRN
Start: 2018-05-04 — End: 2018-05-05
  Administered 2018-05-04 – 2018-05-05 (×2): 50 mg via ORAL
  Filled 2018-05-04 (×2): qty 1

## 2018-05-04 MED ORDER — ENOXAPARIN SODIUM 30 MG/0.3ML ~~LOC~~ SOLN
30.0000 mg | SUBCUTANEOUS | Status: DC
  Administered 2018-05-04: 30 mg via SUBCUTANEOUS
  Filled 2018-05-04: qty 0.3

## 2018-05-04 MED ORDER — HYDROMORPHONE HCL 1 MG/ML IJ SOLN
0.5000 mg | Freq: Four times a day (QID) | INTRAMUSCULAR | Status: DC | PRN
  Administered 2018-05-04 – 2018-05-05 (×2): 0.5 mg via INTRAVENOUS
  Filled 2018-05-04 (×2): qty 0.5

## 2018-05-04 MED ORDER — ONDANSETRON HCL 4 MG PO TABS: 4.0000 mg | ORAL_TABLET | Freq: Four times a day (QID) | ORAL | Status: DC | PRN

## 2018-05-04 MED ORDER — SODIUM CHLORIDE 0.9 % IV SOLN: INTRAVENOUS | Status: AC

## 2018-05-04 MED ORDER — HYDRALAZINE HCL 20 MG/ML IJ SOLN: 2.0000 mg | Freq: Four times a day (QID) | INTRAMUSCULAR | Status: DC | PRN

## 2018-05-04 MED ORDER — FLUTICASONE PROPIONATE 50 MCG/ACT NA SUSP: 1.0000 | Freq: Every day | NASAL | Status: DC | PRN

## 2018-05-04 MED ORDER — ACETAMINOPHEN 325 MG PO TABS: 650.0000 mg | ORAL_TABLET | Freq: Four times a day (QID) | ORAL | Status: DC | PRN

## 2018-05-04 MED ORDER — SODIUM CHLORIDE 0.9 % IV SOLN
INTRAVENOUS | Status: AC
  Administered 2018-05-04: 1000 mL via INTRAVENOUS

## 2018-05-04 MED ORDER — TRIAMCINOLONE ACETONIDE 0.1 % EX CREA
1.0000 "application " | TOPICAL_CREAM | Freq: Four times a day (QID) | CUTANEOUS | Status: DC
  Administered 2018-05-04 – 2018-05-05 (×2): 1 via TOPICAL
  Filled 2018-05-04: qty 15

## 2018-05-04 MED ORDER — ONDANSETRON HCL 4 MG/2ML IJ SOLN: 4.0000 mg | Freq: Four times a day (QID) | INTRAMUSCULAR | Status: DC | PRN

## 2018-05-04 MED ORDER — NYSTATIN 100000 UNIT/GM EX CREA
1.0000 "application " | TOPICAL_CREAM | Freq: Four times a day (QID) | CUTANEOUS | Status: DC
  Administered 2018-05-04 – 2018-05-05 (×2): 1 via TOPICAL
  Filled 2018-05-04: qty 15
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
## 2018-05-04 NOTE — Addendum Note (Signed)
Addended by: Jodean Lima on: 05/04/2018 04:23 PM   Modules accepted: Orders

## 2018-05-04 NOTE — Addendum Note (Signed)
Addended by: Oval Linsey D on: 05/04/2018 04:03 PM   Modules accepted: Orders

## 2018-05-04 NOTE — Progress Notes (Signed)
Patient came on the floor from MD office to [phone] via wheelchair accompanied bu her daughter; alert and oriented x 4; complaints of abdominal pain 10/10; no IV access - many attempts in MD office. Writer paged IV team to come with [place]. At this time writer gave warm packs for pain; skin intact. Orient patient to room and unit; gave patient care guide; instructed how to use the call bell and  fall risk precautions. Will continue to monitor the patient.

## 2018-05-04 NOTE — Telephone Encounter (Signed)
Assist pt to #19 exam room, got warm blankets and friend is sitting with pt.

## 2018-05-04 NOTE — Progress Notes (Signed)
Paged by Dr. Levora Dredge regarding Colleen Medina. Patient showed up for work today looking very ill. Reports nausea, vomiting, and diarrhea since the evening prior. Requesting admission. Bed request has been placed. Patient brought into clinic for evaluation and to initiate blood work while waiting for a bed. Please see full H&P to follow for further details.   Colleen Medina, M.D. - PGY2 Pager: [phone] 05/04/2018, 2:44 PM

## 2018-05-05 DIAGNOSIS — E872 Acidosis: Secondary | ICD-10-CM | POA: Diagnosis not present

## 2018-05-05 DIAGNOSIS — A059 Bacterial foodborne intoxication, unspecified: Secondary | ICD-10-CM | POA: Diagnosis not present

## 2018-05-05 DIAGNOSIS — I10 Essential (primary) hypertension: Secondary | ICD-10-CM | POA: Diagnosis not present

## 2018-05-05 DIAGNOSIS — F329 Major depressive disorder, single episode, unspecified: Secondary | ICD-10-CM | POA: Diagnosis not present

## 2018-05-05 DIAGNOSIS — M797 Fibromyalgia: Secondary | ICD-10-CM | POA: Diagnosis not present

## 2018-05-05 DIAGNOSIS — E785 Hyperlipidemia, unspecified: Secondary | ICD-10-CM | POA: Diagnosis not present

## 2018-05-05 DIAGNOSIS — Z9049 Acquired absence of other specified parts of digestive tract: Secondary | ICD-10-CM | POA: Diagnosis not present

## 2018-05-05 DIAGNOSIS — G44229 Chronic tension-type headache, not intractable: Secondary | ICD-10-CM | POA: Diagnosis not present

## 2018-05-05 DIAGNOSIS — E1143 Type 2 diabetes mellitus with diabetic autonomic (poly)neuropathy: Secondary | ICD-10-CM | POA: Diagnosis not present

## 2018-05-05 DIAGNOSIS — K3184 Gastroparesis: Secondary | ICD-10-CM | POA: Diagnosis not present

## 2018-05-05 DIAGNOSIS — Z880 Allergy status to penicillin: Secondary | ICD-10-CM | POA: Diagnosis not present

## 2018-05-05 DIAGNOSIS — Z79899 Other long term (current) drug therapy: Secondary | ICD-10-CM | POA: Diagnosis not present

## 2018-05-05 LAB — CBC
HEMATOCRIT: 34.2 % — AB (ref 36.0–46.0)
Hemoglobin: 10.2 g/dL — ABNORMAL LOW (ref 12.0–15.0)
MCH: 23.6 pg — AB (ref 26.0–34.0)
MCHC: 29.8 g/dL — ABNORMAL LOW (ref 30.0–36.0)
MCV: 79.2 fL (ref 78.0–100.0)
Platelets: 265 10*3/uL (ref 150–400)
RBC: 4.32 MIL/uL (ref 3.87–5.11)
RDW: 13.9 % (ref 11.5–15.5)
WBC: 7.2 10*3/uL (ref 4.0–10.5)

## 2018-05-05 LAB — BASIC METABOLIC PANEL
Anion gap: 10 (ref 5–15)
BUN: 14 mg/dL (ref 6–20)
CHLORIDE: 99 mmol/L — AB (ref 101–111)
CO2: 27 mmol/L (ref 22–32)
Calcium: 9.1 mg/dL (ref 8.9–10.3)
Creatinine, Ser: 0.8 mg/dL (ref 0.44–1.00)
GFR calc Af Amer: >60 mL/min (ref >60)
GFR calc non Af Amer: >60 mL/min (ref >60)
Glucose, Bld: 97 mg/dL (ref 65–99)
POTASSIUM: 3.6 mmol/L (ref 3.5–5.1)
SODIUM: 136 mmol/L (ref 135–145)

## 2018-05-05 MED ORDER — HYDROMORPHONE HCL 1 MG/ML IJ SOLN: 0.5000 mg | Freq: Two times a day (BID) | INTRAMUSCULAR | Status: DC | PRN

## 2018-05-05 MED ORDER — ONDANSETRON HCL 4 MG PO TABS: 4.0000 mg | ORAL_TABLET | Freq: Three times a day (TID) | ORAL | 0 refills | Status: DC | PRN

## 2018-05-05 MED ORDER — ONDANSETRON HCL 4 MG PO TABS: 4.0000 mg | ORAL_TABLET | Freq: Three times a day (TID) | ORAL | 1 refills | Status: DC | PRN

## 2018-05-05 NOTE — Plan of Care (Signed)
Nsg Discharge Note  Admit Date:  05/04/2018 Discharge date: 05/05/2018   [place] to be D/C'd Home per MD order.  AVS completed.  Copy for chart, and copy for patient signed, and dated. Patient/caregiver able to verbalize understanding.  Discharge Medication: Allergies as of 05/05/2018       Reactions   Sulfonamide Derivatives Anaphylaxis   Penicillins Hives   All over the body Has patient had a PCN reaction causing immediate rash, facial/tongue/throat swelling, SOB or lightheadedness with hypotension: No Has patient had a PCN reaction causing severe rash involving mucus membranes or skin necrosis: No Has patient had a PCN reaction that required hospitalization: Unknown Has patient had a PCN reaction occurring within the last 10 years: No If all of the above answers are "NO", then may proceed with Cephalosporin use.        Medication List     STOP taking these medications    lisinopril 20 MG tablet Commonly known as:  PRINIVIL,ZESTRIL       TAKE these medications    acetaminophen 500 MG tablet Commonly known as:  TYLENOL Take 1,000 mg by mouth every 6 (six) hours as needed for mild pain or headache.   albuterol 108 (90 Base) MCG/ACT inhaler Commonly known as:  PROAIR HFA Inhale 1-2 puffs into the lungs every 6 (six) hours as needed for shortness of breath.   amitriptyline 25 MG tablet Commonly known as:  ELAVIL Take 1 tablet (25 mg total) by mouth at bedtime.   aspirin EC 81 MG tablet Take 1 tablet (81 mg total) by mouth daily.   atorvastatin 80 MG tablet Commonly known as:  LIPITOR Take 1 tablet (80 mg total) by mouth daily. What changed:  when to take this   cetirizine 1 MG/ML syrup Commonly known as:  ZYRTEC Take 10 mLs (10 mg total) by mouth daily. What changed:   when to take this reasons to take this   fluticasone 50 MCG/ACT nasal spray Commonly known as:  FLONASE Place 1 spray into both nostrils daily.   guaiFENesin 600 MG 12 hr  tablet Commonly known as:  MUCINEX Take 1 tablet (600 mg total) by mouth 2 (two) times daily.   nystatin cream Commonly known as:  MYCOSTATIN Apply 1 application topically 4 (four) times daily. To roof of mouth   omeprazole 20 MG capsule Commonly known as:  PRILOSEC Take 1 capsule (20 mg total) by mouth daily.   ondansetron 4 MG tablet Commonly known as:  ZOFRAN Take 1 tablet (4 mg total) by mouth every 8 (eight) hours as needed for up to 15 days for nausea or vomiting.   traMADol 50 MG tablet Commonly known as:  ULTRAM Take 1 tablet (50 mg total) by mouth every 12 (twelve) hours as needed for severe pain.   triamcinolone cream 0.1 % Commonly known as:  KENALOG Apply 1 application topically 4 (four) times daily. To roof of mouth        Discharge Assessment: Vitals:   05/05/18 0453 05/05/18 1317  BP: 123/68 123/65  Pulse: (!) 52 (!) 55  Resp: 18 16  Temp: 98 F (36.7 C) 98.1 F (36.7 C)  SpO2: 98% 98%   Skin clean, dry and intact without evidence of skin break down, no evidence of skin tears noted. IV catheter discontinued intact. Site without signs and symptoms of complications - no redness or edema noted at insertion site, patient denies c/o pain - only slight tenderness at site.  Dressing with  slight pressure applied.  D/c Instructions-Education: Discharge instructions given to patient/family with verbalized understanding. D/c education completed with patient/family including follow up instructions, medication list, d/c activities limitations if indicated, with other d/c instructions as indicated by MD - patient able to verbalize understanding, all questions fully answered. Patient tolerated meals without problems. Will discharge when ride arrives Patient instructed to return to ED, call 911, or call MD for any changes in condition.    Salley Slaughter, RN 05/05/2018 2:45 PM

## 2018-05-05 NOTE — Evaluation (Signed)
Physical Therapy Evaluation Patient Details Name: Colleen Medina MRN: [id number] DOB: [birth year] Today's Date: 05/05/2018   History of Present Illness  Pt. is a 77 y.o. F with significant PMH of type II diabetes mellitus, gastroparesis s/p gastrojejunostomy in 2012, chronic tension headaches, hyperlipidemia, hypertension, depression and fibryomyalgia who presented with nausea, vomiting and diarrhea.  Clinical Impression  Patient evaluated by Physical Therapy with no further acute PT needs identified.  Patient is at baseline other than abdominal pain. Able to walk 120 feet with no assistive device required. Limits activity at home based on fibromyalgia. She does not feel she needs follow up PT and I agree based on her examiantion. All education has been completed and the patient has no further questions. PT is signing off. Thank you for this referral.     Follow Up Recommendations No PT follow up    Equipment Recommendations  None recommended by PT    Recommendations for Other Services       Precautions / Restrictions Precautions Precautions: None Restrictions Weight Bearing Restrictions: No      Mobility  Bed Mobility Overal bed mobility: Independent                Transfers Overall transfer level: Independent Equipment used: None                Ambulation/Gait Ambulation/Gait assistance: Modified independent (Device/Increase time) Ambulation Distance (Feet): 120 Feet Assistive device: None Gait Pattern/deviations: WFL(Within Functional Limits)     General Gait Details: Patient initially very slightly unsteady but no overt LOB noted.   Stairs            Wheelchair Mobility    Modified Rankin (Stroke Patients Only)       Balance Overall balance assessment: Mild deficits observed, not formally tested                                           Pertinent Vitals/Pain Pain Assessment: 0-10 Pain Score: 4  Pain Location:  abdomen Pain Descriptors / Indicators: Discomfort Pain Intervention(s): Monitored during session    Home Living Family/patient expects to be discharged to:: Private residence Living Arrangements: Alone Available Help at Discharge: Family;Available PRN/intermittently Type of Home: House Home Access: Stairs to enter Entrance Stairs-Rails: None Entrance Stairs-Number of Steps: 3 Home Layout: One level Home Equipment: Grab bars - tub/shower;Shower seat;Cane - single point;Walker - 2 wheels      Prior Function Level of Independence: Independent         Comments: modifies activity in accordance to fibromyalgia symptoms     Hand Dominance   Dominant Hand: Right    Extremity/Trunk Assessment   Upper Extremity Assessment Upper Extremity Assessment: Overall WFL for tasks assessed    Lower Extremity Assessment Lower Extremity Assessment: Overall WFL for tasks assessed    Cervical / Trunk Assessment Cervical / Trunk Assessment: Normal  Communication   Communication: No difficulties  Cognition Arousal/Alertness: Awake/alert Behavior During Therapy: WFL for tasks assessed/performed Overall Cognitive Status: Within Functional Limits for tasks assessed                                        General Comments      Exercises     Assessment/Plan    PT Assessment Patent does not need  any further PT services  PT Problem List         PT Treatment Interventions      PT Goals (Current goals can be found in the Care Plan section)  Acute Rehab PT Goals Patient Stated Goal: go home PT Goal Formulation: All assessment and education complete, DC therapy    Frequency     Barriers to discharge        Co-evaluation               AM-PAC PT "6 Clicks" Daily Activity  Outcome Measure Difficulty turning over in bed (including adjusting bedclothes, sheets and blankets)?: None Difficulty moving from lying on back to sitting on the side of the bed? :  None Difficulty sitting down on and standing up from a chair with arms (e.g., wheelchair, bedside commode, etc,.)?: None Help needed moving to and from a bed to chair (including a wheelchair)?: None Help needed walking in hospital room?: None Help needed climbing 3-5 steps with a railing? : None 6 Click Score: 24    End of Session   Activity Tolerance: Patient tolerated treatment well Patient left: in bed;with call bell/phone within reach Nurse Communication: Mobility status PT Visit Diagnosis: Pain;Difficulty in walking, not elsewhere classified (R26.2) Pain - part of body: (abdomen)    Time: 3403-7096 PT Time Calculation (min) (ACUTE ONLY): 22 min   Charges:   PT Evaluation $PT Eval Low Complexity: 1 Low     PT G Codes:       Ellamae Sia, PT, DPT Acute Rehabilitation Services  Pager: [phone]   Willy Eddy 05/05/2018, 12:25 PM

## 2018-05-05 NOTE — Discharge Instructions (Signed)
It was a pleasure to take care of you Colleen Medina. During your hospitalization you were managed for your nausea, vomiting, and diarrhea that is thought to be due to an viral infection. Your symptoms should slowly resolve over the next few days.    Viral Gastroenteritis, Adult Viral gastroenteritis is also known as the stomach flu. This condition is caused by certain germs (viruses). These germs can be passed from person to person very easily (are very contagious). This condition can cause sudden watery poop (diarrhea), fever, and throwing up (vomiting). Having watery poop and throwing up can make you feel weak and cause you to get dehydrated. Dehydration can make you tired and thirsty, make you have a dry mouth, and make it so you pee (urinate) less often. Older adults and people with other diseases or a weak defense system (immune system) are at higher risk for dehydration. It is important to replace the fluids that you lose from having watery poop and throwing up. Follow these instructions at home: Follow instructions from your doctor about how to care for yourself at home. Eating and drinking  Follow these instructions as told by your doctor:  Take an oral rehydration solution (ORS). This is a drink that is sold at pharmacies and stores.  Drink clear fluids in small amounts as you are able, such as: ? Water. ? Ice chips. ? Diluted fruit juice. ? Low-calorie sports drinks.  Eat bland, easy-to-digest foods in small amounts as you are able, such as: ? Bananas. ? Applesauce. ? Rice. ? Low-fat (lean) meats. ? Toast. ? Crackers.  Avoid fluids that have a lot of sugar or caffeine in them.  Avoid alcohol.  Avoid spicy or fatty foods.  General instructions  Drink enough fluid to keep your pee (urine) clear or pale yellow.  Wash your hands often. If you cannot use soap and water, use hand sanitizer.  Make sure that all people in your home wash their hands well and often.  Rest at  home while you get better.  Take over-the-counter and prescription medicines only as told by your doctor.  Watch your condition for any changes.  Take a warm bath to help with any burning or pain from having watery poop.  Keep all follow-up visits as told by your doctor. This is important. Contact a doctor if:  You cannot keep fluids down.  Your symptoms get worse.  You have new symptoms.  You feel light-headed or dizzy.  You have muscle cramps. Get help right away if:  You have chest pain.  You feel very weak or you pass out (faint).  You see blood in your throw-up.  Your throw-up looks like coffee grounds.  You have bloody or black poop (stools) or poop that look like tar.  You have a very bad headache, a stiff neck, or both.  You have a rash.  You have very bad pain, cramping, or bloating in your belly (abdomen).  You have trouble breathing.  You are breathing very quickly.  Your heart is beating very quickly.  Your skin feels cold and clammy.  You feel confused.  You have pain when you pee.  You have signs of dehydration, such as: ? Dark pee, hardly any pee, or no pee. ? Cracked lips. ? Dry mouth. ? Sunken eyes. ? Sleepiness. ? Weakness. This information is not intended to replace advice given to you by your health care provider. Make sure you discuss any questions you have with your health care provider.  Document Released: 05/23/2008 Document Revised: 06/24/2016 Document Reviewed: 08/11/2015 Elsevier Interactive Patient Education  2017 [employer].

## 2018-05-05 NOTE — Discharge Summary (Signed)
Name: Colleen Medina MRN: [id number] DOB: [birth year] 77 y.o. PCP: Oval Linsey, MD  Date of Admission: 05/04/2018  6:10 PM Date of Discharge: 05/06/18 Attending Physician: Dr. Dareen Piano  Discharge Diagnosis:  Active Problems: Nausea with vomiting Gastroenteritis due to toxin in food Hx of Gastroparesis  Discharge Medications: Allergies as of 05/05/2018      Reactions   Sulfonamide Derivatives Anaphylaxis   Penicillins Hives   All over the body Has patient had a PCN reaction causing immediate rash, facial/tongue/throat swelling, SOB or lightheadedness with hypotension: No Has patient had a PCN reaction causing severe rash involving mucus membranes or skin necrosis: No Has patient had a PCN reaction that required hospitalization: Unknown Has patient had a PCN reaction occurring within the last 10 years: No If all of the above answers are "NO", then may proceed with Cephalosporin use.      Medication List    STOP taking these medications   lisinopril 20 MG tablet Commonly known as:  PRINIVIL,ZESTRIL     TAKE these medications   acetaminophen 500 MG tablet Commonly known as:  TYLENOL Take 1,000 mg by mouth every 6 (six) hours as needed for mild pain or headache.   albuterol 108 (90 Base) MCG/ACT inhaler Commonly known as:  PROAIR HFA Inhale 1-2 puffs into the lungs every 6 (six) hours as needed for shortness of breath.   amitriptyline 25 MG tablet Commonly known as:  ELAVIL Take 1 tablet (25 mg total) by mouth at bedtime.   aspirin EC 81 MG tablet Take 1 tablet (81 mg total) by mouth daily.   atorvastatin 80 MG tablet Commonly known as:  LIPITOR Take 1 tablet (80 mg total) by mouth daily. What changed:  when to take this   cetirizine 1 MG/ML syrup Commonly known as:  ZYRTEC Take 10 mLs (10 mg total) by mouth daily. What changed:    when to take this  reasons to take this   fluticasone 50 MCG/ACT nasal spray Commonly known as:  FLONASE Place 1 spray  into both nostrils daily.   guaiFENesin 600 MG 12 hr tablet Commonly known as:  MUCINEX Take 1 tablet (600 mg total) by mouth 2 (two) times daily.   nystatin cream Commonly known as:  MYCOSTATIN Apply 1 application topically 4 (four) times daily. To roof of mouth   omeprazole 20 MG capsule Commonly known as:  PRILOSEC Take 1 capsule (20 mg total) by mouth daily.   ondansetron 4 MG tablet Commonly known as:  ZOFRAN Take 1 tablet (4 mg total) by mouth every 8 (eight) hours as needed for up to 15 days for nausea or vomiting.   traMADol 50 MG tablet Commonly known as:  ULTRAM Take 1 tablet (50 mg total) by mouth every 12 (twelve) hours as needed for severe pain.   triamcinolone cream 0.1 % Commonly known as:  KENALOG Apply 1 application topically 4 (four) times daily. To roof of mouth       Disposition and follow-up:   [name] was discharged from [facility] in good condition.  At the hospital follow up visit please address:  1.    Gastroenteritis: please encourage the patient to slowly resume diet and keep well hydrated. The patient's lisinopril was held due to nausea. Please restart the patient's lisinopril at follow up visit. The patient's kidney function remained at baseline throughout the hospitalization.  2.  Labs / imaging needed at time of follow-up: none  3.  Pending labs/  test needing follow-up: none  Follow-up Appointments: Follow-up Information    Oval Linsey, MD. Schedule an appointment as soon as possible for a visit in 1 week(s).   Specialty:  Internal Medicine Contact information: [address] [place] [id number] [phone]           Hospital Course by problem list: Active Problems:   Nausea with vomiting   Gastroenteritis due to toxin in food   Gastroenteritis The patient is a type 2 diabetic with gastroparesis who got a gastrojejunostomy done in 2012. She presented with a one day history of multiple  episodes of nausea, vomiting, and diarrhea. The patient also had diffuse abdominal pain. The patient did not have any increase in liver enzymes or lipase to suggest hepatic origin of the patient's abdominal pain. The patient's symptoms were also thought be less likely due to gastroparesis as the patient had a gastrojejunostomy that should have addressed the issue.   The patient mentioned that she had eaten shrimp a few days prior which made gastroenteritis a more likely cause of the symptoms.  The patient was given fluids, anti-emetic agents, and pain reliever. The patient's diet was slowly resumed and she was able to tolerate it. The patient is to follow in internal medicine clinic to check for complete symptom resolution within a week.   Discharge Vitals:   BP 123/65 (BP Location: Right Arm)   Pulse (!) 55   Temp 98.1 F (36.7 C) (Oral)   Resp 16   SpO2 98%   Pertinent Labs, Studies, and Procedures:  BMP Latest Ref Rng & Units 05/05/2018 05/04/2018 01/13/2018  Glucose 65 - 99 mg/dL 97 165(H) 163(H)  BUN 6 - 20 mg/dL 14 9 12   Creatinine 0.44 - 1.00 mg/dL 0.80 0.89 0.88  BUN/Creat Ratio 12 - 28 - - -  Sodium 135 - 145 mmol/L 136 135 139  Potassium 3.5 - 5.1 mmol/L 3.6 3.8 4.2  Chloride 101 - 111 mmol/L 99(L) 97(L) 106  CO2 22 - 32 mmol/L 27 25 23   Calcium 8.9 - 10.3 mg/dL 9.1 10.0 9.0   CMP Latest Ref Rng & Units 05/05/2018 05/04/2018 01/13/2018  Glucose 65 - 99 mg/dL 97 165(H) 163(H)  BUN 6 - 20 mg/dL 14 9 12   Creatinine 0.44 - 1.00 mg/dL 0.80 0.89 0.88  Sodium 135 - 145 mmol/L 136 135 139  Potassium 3.5 - 5.1 mmol/L 3.6 3.8 4.2  Chloride 101 - 111 mmol/L 99(L) 97(L) 106  CO2 22 - 32 mmol/L 27 25 23   Calcium 8.9 - 10.3 mg/dL 9.1 10.0 9.0  Total Protein 6.5 - 8.1 g/dL - 8.0 -  Total Bilirubin 0.3 - 1.2 mg/dL - 1.5(H) -  Alkaline Phos 38 - 126 U/L - 79 -  AST 15 - 41 U/L - 27 -  ALT 14 - 54 U/L - 20 -     CBC Latest Ref Rng & Units 05/05/2018 05/04/2018 01/13/2018  WBC 4.0 - 10.5  K/uL 7.2 5.9 11.7(H)  Hemoglobin 12.0 - 15.0 g/dL 10.2(L) 11.1(L) 9.6(L)  Hematocrit 36.0 - 46.0 % 34.2(L) 36.7 31.9(L)  Platelets 150 - 400 K/uL 265 296 315     Discharge Instructions: Discharge Instructions    Call MD for:  extreme fatigue   Complete by:  As directed    Call MD for:  persistant dizziness or light-headedness   Complete by:  As directed    Call MD for:  persistant nausea and vomiting   Complete by:  As directed  Call MD for:  temperature >100.4   Complete by:  As directed       Signed: Lars Mage, MD 05/06/2018, 6:49 PM   Pager: [phone]

## 2018-05-05 NOTE — Progress Notes (Signed)
   Subjective: Colleen Medina was seen resting comfortably in her bed this morning. She states that she is feeling much better in comparison to yesterday. She also feels that she feels that her nausea has improved.   Objective:  Vital signs in last 24 hours: Vitals:   05/04/18 2218 05/05/18 0453  BP: 131/75 123/68  Pulse: 67 (!) 52  Resp: 18 18  Temp: 98.9 F (37.2 C) 98 F (36.7 C)  TempSrc: Oral Oral  SpO2: 97% 98%   Physical Exam  Constitutional: She is oriented to person, place, and time. She appears well-developed and well-nourished. No distress.  HENT:  Head: Normocephalic and atraumatic.  Eyes: Conjunctivae are normal.  Cardiovascular: Normal rate, regular rhythm and normal heart sounds.  Respiratory: Effort normal and breath sounds normal. No respiratory distress. She has no wheezes.  GI: Soft. Bowel sounds are normal. She exhibits no distension. There is tenderness (mild amount).  Musculoskeletal: She exhibits no edema.  Neurological: She is alert and oriented to person, place, and time.  Skin: She is not diaphoretic. No erythema.  Psychiatric: She has a normal mood and affect. Her behavior is normal. Judgment and thought content normal.   Assessment/Plan:  [name] is a 77 y.o with type 2 diabetes mellitus, gastroparesis s/p gastrojejunostomy in 2012, chronic tension headaches, and fibromyalgia who presents with nausea, vomiting, and diarrhea. Patient's abdominal x-ray did not show any signs of obstruction.   Generalized abdominal pain Gastroparesis s/p gastrojejunostomy in 2012 The patient's nausea, vomiting, and abdominal pain have decreased in intensity. She has only needed 2 doses of dilaudid. The patient reported that she had some shrimp from [employer] on Monday 5/13 which may have caused her to develop viral gastroenteritis.   Advanced the patient to a soft diet and will monitor her prior to discharge.   -Pending official read for KUB, but there does not  appear to be any obstruction -Tramadol 50mg  q12hrs prn -Changed Dilaudid from 0.5mg  q6hrs to q12hrs prn.  -Zofran 4mg  q6hrs prn  Diabetes Mellitus type 2 with gastroparesis  The patient's last HbA1C is noted to be 6.5 in Dec 2018. The patient is a diet controlled diabetic.   -cbg checks qam at 5:00  Hypertension The patient's blood pressure since admission has ranged 123-189/68-88.  -Hydralazine 2mg  q6hrs prn if SBP>180 and DBP>110  Hyperlipidemia  -Holding home po atorvastatin 80mg  qd due to patient's nausea  Dispo: Anticipated discharge today.   Lars Mage, MD 05/05/2018, 11:09 AM Pager: [phone]

## 2018-05-11 ENCOUNTER — Ambulatory Visit (INDEPENDENT_AMBULATORY_CARE_PROVIDER_SITE_OTHER): Payer: PPO | Admitting: Internal Medicine

## 2018-05-11 ENCOUNTER — Other Ambulatory Visit: Payer: Self-pay

## 2018-05-11 ENCOUNTER — Encounter: Payer: Self-pay | Admitting: Internal Medicine

## 2018-05-11 VITALS — BP 134/67 | HR 62 | Temp 98.1°F | Wt 126.4 lb

## 2018-05-11 DIAGNOSIS — B370 Candidal stomatitis: Secondary | ICD-10-CM

## 2018-05-11 DIAGNOSIS — M7550 Bursitis of unspecified shoulder: Secondary | ICD-10-CM

## 2018-05-11 DIAGNOSIS — M159 Polyosteoarthritis, unspecified: Secondary | ICD-10-CM

## 2018-05-11 DIAGNOSIS — I10 Essential (primary) hypertension: Secondary | ICD-10-CM

## 2018-05-11 DIAGNOSIS — E7800 Pure hypercholesterolemia, unspecified: Secondary | ICD-10-CM

## 2018-05-11 DIAGNOSIS — J4520 Mild intermittent asthma, uncomplicated: Secondary | ICD-10-CM

## 2018-05-11 DIAGNOSIS — R110 Nausea: Secondary | ICD-10-CM

## 2018-05-11 DIAGNOSIS — M150 Primary generalized (osteo)arthritis: Secondary | ICD-10-CM | POA: Diagnosis not present

## 2018-05-11 DIAGNOSIS — K449 Diaphragmatic hernia without obstruction or gangrene: Secondary | ICD-10-CM

## 2018-05-11 DIAGNOSIS — I700 Atherosclerosis of aorta: Secondary | ICD-10-CM

## 2018-05-11 DIAGNOSIS — E1143 Type 2 diabetes mellitus with diabetic autonomic (poly)neuropathy: Secondary | ICD-10-CM

## 2018-05-11 DIAGNOSIS — K219 Gastro-esophageal reflux disease without esophagitis: Secondary | ICD-10-CM

## 2018-05-11 DIAGNOSIS — F325 Major depressive disorder, single episode, in full remission: Secondary | ICD-10-CM

## 2018-05-11 LAB — POCT GLYCOSYLATED HEMOGLOBIN (HGB A1C): Hemoglobin A1C: 6.5 % — AB (ref 4.0–5.6)

## 2018-05-11 LAB — GLUCOSE, CAPILLARY: Glucose-Capillary: 102 mg/dL — ABNORMAL HIGH (ref 65–99)

## 2018-05-11 MED ORDER — TRIAMCINOLONE ACETONIDE 0.1 % EX CREA: 1.0000 "application " | TOPICAL_CREAM | Freq: Four times a day (QID) | CUTANEOUS | 3 refills | Status: DC

## 2018-05-11 MED ORDER — LISINOPRIL 20 MG PO TABS: 20.0000 mg | ORAL_TABLET | Freq: Every day | ORAL | 3 refills | Status: DC

## 2018-05-11 MED ORDER — NYSTATIN 100000 UNIT/GM EX CREA: 1.0000 "application " | TOPICAL_CREAM | Freq: Four times a day (QID) | CUTANEOUS | 3 refills | Status: DC

## 2018-05-11 MED ORDER — GUAIFENESIN ER 600 MG PO TB12: 600.0000 mg | ORAL_TABLET | Freq: Two times a day (BID) | ORAL | 3 refills | Status: DC

## 2018-05-11 MED ORDER — ONDANSETRON HCL 4 MG PO TABS: 4.0000 mg | ORAL_TABLET | Freq: Three times a day (TID) | ORAL | 1 refills | Status: AC | PRN

## 2018-05-11 MED ORDER — NAPROXEN 500 MG PO TBEC
500.0000 mg | DELAYED_RELEASE_TABLET | Freq: Two times a day (BID) | ORAL | 3 refills | Status: DC | PRN
Start: 2018-05-11 — End: 2019-04-17

## 2018-05-11 MED ORDER — OMEPRAZOLE 20 MG PO CPDR: 20.0000 mg | DELAYED_RELEASE_CAPSULE | Freq: Every day | ORAL | 3 refills | Status: DC

## 2018-05-11 MED ORDER — ROSUVASTATIN CALCIUM 20 MG PO TABS: 20.0000 mg | ORAL_TABLET | Freq: Every day | ORAL | 3 refills | Status: DC

## 2018-05-11 NOTE — Assessment & Plan Note (Signed)
Assessment  With her decreased appetite and significant fatigue I was concerned she may have had recurrence of her major depression with our recent discontinuance of her SSRI therapy. We repeated the PHQ-9 today and it was 5. Therefore, I do not believe she has significant depression as a cause of the symptoms.  She was not interested in taking medication for depression at this time as she does not believe this would improve her symptoms.  Plan  There is no need for SSRI therapy at this time. In fact, we decided to stop the amitriptyline given her fatigue as this may be playing a role. We will reassess for signs or symptoms consistent with depression at the follow-up visit.

## 2018-05-11 NOTE — Assessment & Plan Note (Signed)
Assessment  Her mild intermittent asthma has remained without complication as she has required very infrequent albuterol rescue inhalations.  Plan  We will continue the as needed albuterol for her mild intermittent asthma and reassess the efficacy of this therapy at the follow-up visit.

## 2018-05-11 NOTE — Assessment & Plan Note (Signed)
She is currently up-to-date on healthcare maintenance.

## 2018-05-11 NOTE — Assessment & Plan Note (Signed)
Assessment  Her diabetes remains well controlled on diet alone. Her hemoglobin A1c today was 6.5.  She was placed back on her ACE inhibitor as well as a change in her statin therapy. A diabetic foot exam was performed today and she is otherwise up-to-date on her diabetic health care maintenance with a urine microalbumin that was collected and pending at the time of this dictation.  With resolution of her viral gastroenteritis she's had no further nausea and therefore exacerbation of her mild diabetic gastroparesis.  Plan  She was praised for her excellent diabetic control and was encouraged to continue with her dietary restrictions and efforts at maintaining a healthy weight. We will reassess the control of her diabetes with a repeat hemoglobin A1c at the follow-up visit.

## 2018-05-11 NOTE — Assessment & Plan Note (Signed)
Assessment  Her blood pressure was reasonably well controlled at 134/67 this morning.  Of note, her lisinopril had been discontinued during the hospitalization given her viral gastroenteritis. She would benefit from improved blood pressure control with a target less than 130 given the new guidelines. She also requires ace inhibition given her underlying diabetes.  Plan  The lisinopril 20 mg by mouth daily was restarted. We will reassess the blood pressure at the follow-up visit and if above target we will increase the lisinopril to 40 mg by mouth daily.

## 2018-05-11 NOTE — Patient Instructions (Signed)
It was great to see you looking much better than last week.  1) Stop taking the tramadol and amitriptyline.  These medications may be making you so fatigued.  2) Restart the lisinopril 20 mg daily to protect your kidneys.  3) We checked a urine for protein today.  I will call you next week if there is anything we need to do.  If you do not hear from me by Tuesday everything was OK.  4) Start Naproxsen 500 mg by mouth twice a day as needed for joint pain.  Please take with food.  5) I refilled all of your other medications and sent them to the pharmacy.  6) Keep taking your other medications as you have.  7) I injected your left shoulder today in hopes of providing you with some relief.  I will see you back in 3 months, sooner if necessary.

## 2018-05-11 NOTE — Assessment & Plan Note (Signed)
Assessment  Over the last 4 weeks she's had significant pain in the left shoulder. This was particularly bad when she abducted at the left shoulder beyond 90 degrees. It was also severe when she performed an internal rotation maneuver, such as reaching into her back pocket. At times it made it difficult to sleep especially when she was lying on her left shoulder. In the past, she had similar symptoms in the right shoulder which responded well to a steroid injection. Given her physical examination I am concerned this pain represents a left subacromial bursitis and it may benefit from a steroid injection.  Plan  She was willing to undergo a left shoulder injection to treat a presumed subacromial bursitis. This was completed in she noted some improvement in the pain soon after the procedure was done. Please see the procedure note below.  Procedure: Left shoulder steroid injection using the lateral approach.  The patient was provided informed consent and agreed to proceed. A time out was called to confirm the appropriate patient, shoulder, and procedure. The left shoulder was prepped in the usual sterile fashion and numbing was achieved with a freeze spray. One mL of Kenalog 40 mg/mL and 1 mL of 1% lidocaine was injected into the left shoulder using the lateral approach and a 26-gauge needle. The patient tolerated the procedure well and noted improvement in symptoms soon after completion. There were no immediate complications.

## 2018-05-11 NOTE — Progress Notes (Signed)
   Subjective:    Patient ID: Colleen Medina, female    DOB: 09/19/1941, 77 y.o.   MRN: [id number]  HPI  Colleen Medina is here for follow-up of her diabetes complicated by diabetic gastroparesis, hyperlipidemia, essential hypertension, mild intermittent asthma, osteoarthritis, major depression in remission, and left subacromial bursitis. She was also admitted to the hospital last week with viral gastroenteritis and this is a hospital follow-up visit as well.  Please see the A&P for the status of the pt's chronic medical problems.  She feels much better since discharge without significant nausea or diaphoresis.  Her oral intake is back to baseline.  Review of Systems  Constitutional: Positive for appetite change and fatigue. Negative for activity change, fever and unexpected weight change.       She has a decreased appetite.  She also notes extreme fatigue over the last several weeks.  HENT: Positive for mouth sores.        Thrush on roof of mouth  Respiratory: Negative for chest tightness, shortness of breath and wheezing.   Cardiovascular: Positive for leg swelling. Negative for chest pain and palpitations.  Gastrointestinal: Positive for nausea. Negative for vomiting.  Musculoskeletal: Positive for arthralgias. Negative for joint swelling and myalgias.  Skin: Negative for rash and wound.  Psychiatric/Behavioral: Negative for decreased concentration, dysphoric mood and sleep disturbance. The patient is not nervous/anxious.       Objective:   Physical Exam  Constitutional: She is oriented to person, place, and time. She appears well-developed and well-nourished. No distress.  HENT:  Head: Normocephalic and atraumatic.  Eyes: Right eye exhibits no discharge. Left eye exhibits no discharge. No scleral icterus.  Musculoskeletal: Normal range of motion. She exhibits edema. She exhibits no tenderness or deformity.  Left shoulder pain with internal rotation and abduction at 90 degrees    Neurological: She is alert and oriented to person, place, and time. She exhibits normal muscle tone.  Skin: Skin is warm and dry. No rash noted. She is not diaphoretic. No erythema.  Psychiatric: She has a normal mood and affect. Her behavior is normal. Judgment and thought content normal.  Nursing note and vitals reviewed.     Assessment & Plan:   Please see problem based charting.

## 2018-05-11 NOTE — Assessment & Plan Note (Signed)
Assessment  At the last clinic visit a lipid profile was obtained in revealed an LDL greater than 160. This was on atorvastatin 80 mg by mouth daily. We discussed switching to another high intensity statin and she was interested.  Plan  The atorvastatin 80 mg by mouth daily was discontinued and rosuvastatin 20 mg by mouth daily was started. At the follow-up visit we will check a fasting lipid profile and if necessary increase the dose to 40 mg daily if the current 20 mg per daily dose is inadequate in controlling her hyperlipidemia.

## 2018-05-11 NOTE — Assessment & Plan Note (Signed)
Assessment  She continues to have difficulty with diffuse joint pain. This has not responded to Tylenol or tramadol which are currently on her medication list. In the past, it was also unresponsive to ibuprofen and diclofenac. She is interested in some pharmacotherapy in order to improve her symptoms and allow her to accomplish activities around the house.  Plan  We have discontinued the tramadol given it is likely causing fatigue without benefiting her pain. We started naproxen 500 mg by mouth twice daily as needed with food and will assess the efficacy of this therapy in managing her osteoarthritic symptoms. If ineffective, we will consider switching to meloxicam at the follow-up visit.

## 2018-05-12 LAB — MICROALBUMIN / CREATININE URINE RATIO
Creatinine, Urine: 187 mg/dL
MICROALB/CREAT RATIO: 3.4 mg/g{creat} (ref 0.0–30.0)
MICROALBUM., U, RANDOM: 6.3 ug/mL

## 2018-05-15 NOTE — Progress Notes (Signed)
Patient ID: Colleen Medina, female   DOB: February 16, 1941, 77 y.o.   MRN: [id number]  Urine creatinine 187 Microalbumin 6.3 Microalbumin/Creatinine 3.4  No evidence of a diabetic nephropathy at this time.

## 2018-05-16 ENCOUNTER — Ambulatory Visit (INDEPENDENT_AMBULATORY_CARE_PROVIDER_SITE_OTHER): Payer: PPO | Admitting: Internal Medicine

## 2018-05-16 ENCOUNTER — Encounter: Payer: Self-pay | Admitting: Internal Medicine

## 2018-05-16 ENCOUNTER — Other Ambulatory Visit: Payer: Self-pay

## 2018-05-16 VITALS — BP 115/78 | HR 85 | Temp 98.0°F | Wt 114.5 lb

## 2018-05-16 DIAGNOSIS — K219 Gastro-esophageal reflux disease without esophagitis: Secondary | ICD-10-CM

## 2018-05-16 DIAGNOSIS — K449 Diaphragmatic hernia without obstruction or gangrene: Secondary | ICD-10-CM

## 2018-05-16 MED ORDER — DEXLANSOPRAZOLE 30 MG PO CPDR: DELAYED_RELEASE_CAPSULE | ORAL | 0 refills | Status: DC

## 2018-05-16 NOTE — Patient Instructions (Addendum)
We have switched your acid reflux medication to dexilant.  You will take this thirty minutes before you eat breakfast.  Please follow up with your GI doctor to reestablish care.  Please avoid naproxen use while having this stomach pain.
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
## 2018-05-17 MED ORDER — DEXLANSOPRAZOLE 30 MG PO CPDR: DELAYED_RELEASE_CAPSULE | ORAL | 0 refills | Status: DC

## 2018-05-17 NOTE — Progress Notes (Signed)
Internal Medicine Clinic Attending  Case discussed with Dr. Winfrey  at the time of the visit.  We reviewed the resident's history and exam and pertinent patient test results.  I agree with the assessment, diagnosis, and plan of care documented in the resident's note.  

## 2018-05-17 NOTE — Assessment & Plan Note (Addendum)
Had gastrojejunostomy around 2012.  Has had pain, nausea, sour taste that comes up but no vomiting.  Had some liquids in hospital but was discharged before solid foods were introduced.  Pain increases with food intake, has GERD after eating.  No pain with bowel movements, no diarrhea, not constipated last bm this am and was not loose semi solid did not check for blood.  No fevers or chills, pt able to tolerate liquids and soft diet but appetite still very decreased. Currently taking naproxen since Friday.  Currently nauseous but no vomiting last vomiting episode 2 days ago.  Main site of pain just above umbilicus>RLQ and RUQ.  She has seen Dr. Oletta Lamas, [facility] in the past, has not seen for some time.  Reviewing labs pt with microcytic anemia was negative for iron deficiency anemia in the past, no recent workup other than a ferritin in January that was 123.    -will switch patient to dexilant 30mg  BID for 7 days and then have pt take once daily.  Zofran already prescribed by pcp  -asked pt to stop naproxen for the time being -referral for pt to reestablish care with GI.

## 2018-05-18 ENCOUNTER — Telehealth: Payer: Self-pay | Admitting: Internal Medicine

## 2018-05-22 DIAGNOSIS — K3184 Gastroparesis: Secondary | ICD-10-CM | POA: Diagnosis not present

## 2018-05-22 DIAGNOSIS — K219 Gastro-esophageal reflux disease without esophagitis: Secondary | ICD-10-CM | POA: Diagnosis not present

## 2018-05-22 DIAGNOSIS — R1084 Generalized abdominal pain: Secondary | ICD-10-CM | POA: Diagnosis not present

## 2018-05-22 DIAGNOSIS — R112 Nausea with vomiting, unspecified: Secondary | ICD-10-CM | POA: Diagnosis not present

## 2018-05-24 DIAGNOSIS — Z79899 Other long term (current) drug therapy: Secondary | ICD-10-CM | POA: Diagnosis not present

## 2018-05-24 DIAGNOSIS — F411 Generalized anxiety disorder: Secondary | ICD-10-CM | POA: Diagnosis not present

## 2018-05-24 DIAGNOSIS — Z79891 Long term (current) use of opiate analgesic: Secondary | ICD-10-CM | POA: Diagnosis not present

## 2018-05-24 DIAGNOSIS — F3132 Bipolar disorder, current episode depressed, moderate: Secondary | ICD-10-CM | POA: Diagnosis not present

## 2018-05-24 DIAGNOSIS — F339 Major depressive disorder, recurrent, unspecified: Secondary | ICD-10-CM | POA: Diagnosis not present

## 2018-05-24 DIAGNOSIS — F3130 Bipolar disorder, current episode depressed, mild or moderate severity, unspecified: Secondary | ICD-10-CM | POA: Diagnosis not present

## 2018-05-24 DIAGNOSIS — F319 Bipolar disorder, unspecified: Secondary | ICD-10-CM | POA: Diagnosis not present

## 2018-05-26 NOTE — Telephone Encounter (Signed)
Encounter opened in error

## 2018-06-14 ENCOUNTER — Encounter: Payer: Self-pay | Admitting: Internal Medicine

## 2018-06-18 ENCOUNTER — Other Ambulatory Visit: Payer: Self-pay | Admitting: Gastroenterology

## 2018-06-18 DIAGNOSIS — K222 Esophageal obstruction: Secondary | ICD-10-CM | POA: Diagnosis not present

## 2018-06-18 DIAGNOSIS — K3184 Gastroparesis: Secondary | ICD-10-CM | POA: Diagnosis not present

## 2018-06-18 DIAGNOSIS — K219 Gastro-esophageal reflux disease without esophagitis: Secondary | ICD-10-CM | POA: Diagnosis not present

## 2018-06-18 DIAGNOSIS — R112 Nausea with vomiting, unspecified: Secondary | ICD-10-CM | POA: Diagnosis not present

## 2018-06-18 DIAGNOSIS — E119 Type 2 diabetes mellitus without complications: Secondary | ICD-10-CM | POA: Diagnosis not present

## 2018-06-27 ENCOUNTER — Encounter (HOSPITAL_COMMUNITY): Payer: Self-pay

## 2018-07-02 ENCOUNTER — Ambulatory Visit (HOSPITAL_COMMUNITY): Payer: PPO | Admitting: Certified Registered Nurse Anesthetist

## 2018-07-02 ENCOUNTER — Ambulatory Visit (HOSPITAL_COMMUNITY)
Admission: RE | Admit: 2018-07-02 | Discharge: 2018-07-02 | Disposition: A | Discharge status: Home / Self Care | Payer: PPO | Source: Ambulatory Visit | Attending: Gastroenterology | Admitting: Gastroenterology

## 2018-07-02 ENCOUNTER — Encounter (HOSPITAL_COMMUNITY): Payer: Self-pay | Admitting: Certified Registered Nurse Anesthetist

## 2018-07-02 ENCOUNTER — Other Ambulatory Visit: Payer: Self-pay

## 2018-07-02 ENCOUNTER — Ambulatory Visit (HOSPITAL_COMMUNITY): Payer: PPO

## 2018-07-02 ENCOUNTER — Encounter (HOSPITAL_COMMUNITY)
Admission: RE | Disposition: A | Discharge status: Home / Self Care | Payer: Self-pay | Source: Ambulatory Visit | Attending: Gastroenterology

## 2018-07-02 DIAGNOSIS — K222 Esophageal obstruction: Secondary | ICD-10-CM | POA: Insufficient documentation

## 2018-07-02 DIAGNOSIS — K3184 Gastroparesis: Secondary | ICD-10-CM | POA: Diagnosis not present

## 2018-07-02 DIAGNOSIS — R1310 Dysphagia, unspecified: Secondary | ICD-10-CM | POA: Diagnosis not present

## 2018-07-02 DIAGNOSIS — E559 Vitamin D deficiency, unspecified: Secondary | ICD-10-CM | POA: Diagnosis not present

## 2018-07-02 DIAGNOSIS — K219 Gastro-esophageal reflux disease without esophagitis: Secondary | ICD-10-CM | POA: Diagnosis not present

## 2018-07-02 DIAGNOSIS — M8580 Other specified disorders of bone density and structure, unspecified site: Secondary | ICD-10-CM | POA: Diagnosis not present

## 2018-07-02 DIAGNOSIS — I10 Essential (primary) hypertension: Secondary | ICD-10-CM | POA: Diagnosis not present

## 2018-07-02 DIAGNOSIS — Z8249 Family history of ischemic heart disease and other diseases of the circulatory system: Secondary | ICD-10-CM | POA: Insufficient documentation

## 2018-07-02 DIAGNOSIS — Z980 Intestinal bypass and anastomosis status: Secondary | ICD-10-CM | POA: Insufficient documentation

## 2018-07-02 DIAGNOSIS — Z882 Allergy status to sulfonamides status: Secondary | ICD-10-CM | POA: Diagnosis not present

## 2018-07-02 DIAGNOSIS — Z7982 Long term (current) use of aspirin: Secondary | ICD-10-CM | POA: Diagnosis not present

## 2018-07-02 DIAGNOSIS — M1990 Unspecified osteoarthritis, unspecified site: Secondary | ICD-10-CM | POA: Diagnosis not present

## 2018-07-02 DIAGNOSIS — E785 Hyperlipidemia, unspecified: Secondary | ICD-10-CM | POA: Diagnosis not present

## 2018-07-02 DIAGNOSIS — E1143 Type 2 diabetes mellitus with diabetic autonomic (poly)neuropathy: Secondary | ICD-10-CM | POA: Diagnosis not present

## 2018-07-02 DIAGNOSIS — M797 Fibromyalgia: Secondary | ICD-10-CM | POA: Diagnosis not present

## 2018-07-02 DIAGNOSIS — Z79899 Other long term (current) drug therapy: Secondary | ICD-10-CM | POA: Insufficient documentation

## 2018-07-02 DIAGNOSIS — I872 Venous insufficiency (chronic) (peripheral): Secondary | ICD-10-CM | POA: Diagnosis not present

## 2018-07-02 DIAGNOSIS — Z880 Allergy status to penicillin: Secondary | ICD-10-CM | POA: Insufficient documentation

## 2018-07-02 DIAGNOSIS — I700 Atherosclerosis of aorta: Secondary | ICD-10-CM | POA: Insufficient documentation

## 2018-07-02 HISTORY — PX: ESOPHAGOGASTRODUODENOSCOPY (EGD) WITH PROPOFOL: SHX5813

## 2018-07-02 HISTORY — PX: SAVORY DILATION: SHX5439

## 2018-07-02 LAB — GLUCOSE, CAPILLARY: Glucose-Capillary: 109 mg/dL — ABNORMAL HIGH (ref 70–99)

## 2018-07-02 SURGERY — ESOPHAGOGASTRODUODENOSCOPY (EGD) WITH PROPOFOL
Anesthesia: Monitor Anesthesia Care

## 2018-07-02 MED ORDER — PROPOFOL 500 MG/50ML IV EMUL
INTRAVENOUS | Status: DC | PRN
  Administered 2018-07-02: 125 ug/kg/min via INTRAVENOUS

## 2018-07-02 MED ORDER — FENTANYL CITRATE (PF) 100 MCG/2ML IJ SOLN
INTRAMUSCULAR | Status: AC
  Filled 2018-07-02: qty 2

## 2018-07-02 MED ORDER — ALBUTEROL SULFATE HFA 108 (90 BASE) MCG/ACT IN AERS
INHALATION_SPRAY | RESPIRATORY_TRACT | Status: AC
  Filled 2018-07-02: qty 6.7

## 2018-07-02 MED ORDER — PROPOFOL 10 MG/ML IV BOLUS
INTRAVENOUS | Status: AC
  Filled 2018-07-02: qty 40

## 2018-07-02 MED ORDER — LACTATED RINGERS IV SOLN
INTRAVENOUS | Status: DC
  Administered 2018-07-02: 1000 mL via INTRAVENOUS

## 2018-07-02 SURGICAL SUPPLY — 14 items

## 2018-07-02 NOTE — Anesthesia Preprocedure Evaluation (Signed)
Anesthesia Evaluation  Patient identified by MRN, date of birth, ID band Patient awake    Reviewed: Allergy & Precautions, NPO status , Patient's Chart, lab work & pertinent test results  Airway Mallampati: II  TM Distance: >3 FB Neck ROM: Full    Dental no notable dental hx.    Pulmonary neg pulmonary ROS,    Pulmonary exam normal breath sounds clear to auscultation       Cardiovascular hypertension, Normal cardiovascular exam Rhythm:Regular Rate:Normal     Neuro/Psych negative neurological ROS  negative psych ROS   GI/Hepatic negative GI ROS, Neg liver ROS,   Endo/Other  diabetes  Renal/GU negative Renal ROS  negative genitourinary   Musculoskeletal negative musculoskeletal ROS (+)   Abdominal   Peds negative pediatric ROS (+)  Hematology negative hematology ROS (+)   Anesthesia Other Findings   Reproductive/Obstetrics negative OB ROS                             Anesthesia Physical Anesthesia Plan  ASA: III  Anesthesia Plan: MAC   Post-op Pain Management:    Induction: Intravenous  PONV Risk Score and Plan: 0  Airway Management Planned: Simple Face Mask  Additional Equipment:   Intra-op Plan:   Post-operative Plan:   Informed Consent: I have reviewed the patients History and Physical, chart, labs and discussed the procedure including the risks, benefits and alternatives for the proposed anesthesia with the patient or authorized representative who has indicated his/her understanding and acceptance.   Dental advisory given  Plan Discussed with: CRNA and Surgeon  Anesthesia Plan Comments:         Anesthesia Quick Evaluation

## 2018-07-02 NOTE — Transfer of Care (Signed)
Immediate Anesthesia Transfer of Care Note  Patient: Colleen Medina  Procedure(s) Performed: ESOPHAGOGASTRODUODENOSCOPY (EGD) WITH PROPOFOL (N/A ) SAVORY DILATION (N/A )  Patient Location: PACU  Anesthesia Type:MAC  Level of Consciousness: awake, alert  and oriented  Airway & Oxygen Therapy: Patient Spontanous Breathing and Patient connected to nasal cannula oxygen  Post-op Assessment: Report given to RN and Post -op Vital signs reviewed and stable  Post vital signs: Reviewed and stable  Last Vitals:  Vitals Value Taken Time  BP    Temp    Pulse    Resp    SpO2      Last Pain:  Vitals:   07/02/18 0810  TempSrc: Oral  PainSc: 0-No pain         Complications: No apparent anesthesia complications

## 2018-07-02 NOTE — Discharge Instructions (Signed)
YOU HAD AN ENDOSCOPIC PROCEDURE TODAY: Refer to the procedure report and other information in the discharge instructions given to you for any specific questions about what was found during the examination. If this information does not answer your questions, please call [facility] office at [phone] to clarify.   YOU SHOULD EXPECT: Some feelings of bloating in the abdomen. Passage of more gas than usual. Walking can help get rid of the air that was put into your GI tract during the procedure and reduce the bloating. If you had a lower endoscopy (such as a colonoscopy or flexible sigmoidoscopy) you may notice spotting of blood in your stool or on the toilet paper. Some abdominal soreness may be present for a day or two, also.  DIET: Your first meal following the procedure should be a light meal and then it is ok to progress to your normal diet. A half-sandwich or bowl of soup is an example of a good first meal. Heavy or fried foods are harder to digest and may make you feel nauseous or bloated. Drink plenty of fluids but you should avoid alcoholic beverages for 24 hours. If you had a esophageal dilation, please see attached instructions for diet.   ACTIVITY: Your care partner should take you home directly after the procedure. You should plan to take it easy, moving slowly for the rest of the day. You can resume normal activity the day after the procedure however YOU SHOULD NOT DRIVE, use power tools, machinery or perform tasks that involve climbing or major physical exertion for 24 hours (because of the sedation medicines used during the test).   SYMPTOMS TO REPORT IMMEDIATELY: A gastroenterologist can be reached at any hour. Please call [phone]  for any of the following symptoms:   Following upper endoscopy (EGD, EUS, ERCP, esophageal dilation) Vomiting of blood or coffee ground material  New, significant abdominal pain  New, significant chest pain or pain under the shoulder blades  Painful or  persistently difficult swallowing  New shortness of breath  Black, tarry-looking or red, bloody stools  FOLLOW UP:  If any biopsies were taken you will be contacted by phone or by letter within the next 1-3 weeks. Call [phone]  if you have not heard about the biopsies in 3 weeks.  Please also call with any specific questions about appointments or follow up tests.   Continue current meds. Clear liquids for 4-6 hours, if no chest pain or trouble breathing, soft foods tonight.  Regular diet tomorrow. Call for problems.
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
## 2018-07-02 NOTE — Op Note (Signed)
[facility] Patient Name: Colleen Medina Procedure Date: 07/02/2018 MRN: [id number] Attending MD: Nancy Fetter Dr., MD Date of Birth: [birth year] CSN: [id number] Age: 77 Admit Type: Outpatient Procedure:                Upper GI endoscopy with Savary dilatation utilizing                            fluoroscopic guidance Indications:              Dysphagia patient has had previous dilatation with                            some improvement to 15 mm. She has a history of                            gastrojejunostomy to the partial resection of her                            duodenum for a large villous adenoma Providers:                [facility]inks L. Shaden Higley Dr., MD, Burtis Junes, RN, William Dalton, Technician Referring MD:             Dr Terressa Koyanagi Medicines:                Monitored Anesthesia Care Complications:            No immediate complications. Estimated Blood Loss:     Estimated blood loss: none. Procedure:                Pre-Anesthesia Assessment:                           - Prior to the procedure, a History and Physical                            was performed, and patient medications and                            allergies were reviewed. The patient's tolerance of                            previous anesthesia was also reviewed. The risks                            and benefits of the procedure and the sedation                            options and risks were discussed with the patient.                            All questions were answered, and informed consent  was obtained. Prior Anticoagulants: The patient has                            taken aspirin, last dose was 1 day prior to                            procedure. ASA Grade Assessment: III - A patient                            with severe systemic disease. After reviewing the                            risks and benefits, the patient was deemed in                          satisfactory condition to undergo the procedure.                           After obtaining informed consent, the endoscope was                            passed under direct vision. Throughout the                            procedure, the patient's blood pressure, pulse, and                            oxygen saturations were monitored continuously. The                            [id number] ) was introduced through the                            mouth, and advanced to the afferent and efferent                            jejunal loops. The upper GI endoscopy was                            accomplished without difficulty. The patient                            tolerated the procedure well. Scope In: Scope Out: Findings:      No gross lesions were noted in the entire esophagus. There was minimal       narrowing in the distal esophagus. A guidewire was placed under       fluoroscopic guidance and the scope was withdrawn. Dilation was       performed with a Savary dilator with severe resistance at 12.8 mm. A       guidewire was placed under fluoroscopic guidance and the scope was       withdrawn. Dilation was performed with a Savary dilator with no       resistance at 14 mm, and 15 mm. There was no blood on any  of the       dilators. After passing 15 mm dilator dilator and guide wire removed       together.      Evidence of a patent Billroth II gastrojejunostomy was found. The       gastrojejunal anastomosis was characterized by inflammation. This was       traversed. The efferent limb was examined. The afferent limb was       examined.      There was evidence of a patent but strictured previous surgical       anastomosis in the duodenal bulb. This was narrowed and we did not       attempt to pass it. Impression:               - No gross lesions in esophagus. Dilated to 15 mm.                            Feel that the patient's gastroparesis may be                             playing a part in her dysphagia.                           - Patent Billroth II gastrojejunostomy was found,                            characterized by inflammation.                           - Patent but strictured previous surgical                            anastomosis was found in the duodenum.                           - No specimens collected. Moderate Sedation:      See anesthesia note, no moderate sedation. Recommendation:           - Patient has a contact number available for                            emergencies. The signs and symptoms of potential                            delayed complications were discussed with the                            patient. Return to normal activities tomorrow.                            Written discharge instructions were provided to the                            patient.                           - Clear liquid diet today.                           -  Patient has a contact number available for                            emergencies. The signs and symptoms of potential                            delayed complications were discussed with the                            patient. Return to normal activities tomorrow.                            Written discharge instructions were provided to the                            patient.                           - Continue present medications.                           - Return to endoscopist in 1 month. Procedure Code(s):        --- Professional ---                           [phone], Esophagogastroduodenoscopy, flexible,                            transoral; with insertion of guide wire followed by                            passage of dilator(s) through esophagus over guide                            wire Diagnosis Code(s):        --- Professional ---                           R13.10, Dysphagia, unspecified                           Z98.0, Intestinal bypass and anastomosis status CPT  copyright 2017 [employer] Medical Association. All rights reserved. The codes documented in this report are preliminary and upon coder review may  be revised to meet current compliance requirements. Nancy Fetter Dr., MD 07/02/2018 9:10:37 AM This report has been signed electronically. Number of Addenda: 0

## 2018-07-02 NOTE — Anesthesia Postprocedure Evaluation (Signed)
Anesthesia Post Note  Patient: Colleen Medina  Procedure(s) Performed: ESOPHAGOGASTRODUODENOSCOPY (EGD) WITH PROPOFOL (N/A ) SAVORY DILATION (N/A )     Patient location during evaluation: PACU Anesthesia Type: MAC Level of consciousness: awake and alert Pain management: pain level controlled Vital Signs Assessment: post-procedure vital signs reviewed and stable Respiratory status: spontaneous breathing, nonlabored ventilation, respiratory function stable and patient connected to nasal cannula oxygen Cardiovascular status: stable and blood pressure returned to baseline Postop Assessment: no apparent nausea or vomiting Anesthetic complications: no    Last Vitals:  Vitals:   07/02/18 0901 07/02/18 0911  BP: 136/61 (!) 160/68  Pulse: 67 64  Resp: 17 16  Temp: (!) 36.4 C   SpO2: 97% 100%    Last Pain:  Vitals:   07/02/18 0911  TempSrc:   PainSc: 3                  Jacinto Keil S

## 2018-07-03 ENCOUNTER — Encounter (HOSPITAL_COMMUNITY): Payer: Self-pay | Admitting: Gastroenterology

## 2018-08-03 DIAGNOSIS — H43391 Other vitreous opacities, right eye: Secondary | ICD-10-CM | POA: Diagnosis not present

## 2018-08-03 DIAGNOSIS — E119 Type 2 diabetes mellitus without complications: Secondary | ICD-10-CM | POA: Diagnosis not present

## 2018-08-03 DIAGNOSIS — I10 Essential (primary) hypertension: Secondary | ICD-10-CM | POA: Diagnosis not present

## 2018-08-03 DIAGNOSIS — Z961 Presence of intraocular lens: Secondary | ICD-10-CM | POA: Diagnosis not present

## 2018-08-13 DIAGNOSIS — K219 Gastro-esophageal reflux disease without esophagitis: Secondary | ICD-10-CM | POA: Diagnosis not present

## 2018-08-13 DIAGNOSIS — K3184 Gastroparesis: Secondary | ICD-10-CM | POA: Diagnosis not present

## 2018-08-13 DIAGNOSIS — D132 Benign neoplasm of duodenum: Secondary | ICD-10-CM | POA: Diagnosis not present

## 2018-08-13 DIAGNOSIS — E119 Type 2 diabetes mellitus without complications: Secondary | ICD-10-CM | POA: Diagnosis not present

## 2018-08-13 DIAGNOSIS — K222 Esophageal obstruction: Secondary | ICD-10-CM | POA: Diagnosis not present

## 2018-08-13 DIAGNOSIS — R143 Flatulence: Secondary | ICD-10-CM | POA: Diagnosis not present

## 2018-08-16 ENCOUNTER — Other Ambulatory Visit: Payer: Self-pay | Admitting: Internal Medicine

## 2018-08-16 DIAGNOSIS — K219 Gastro-esophageal reflux disease without esophagitis: Secondary | ICD-10-CM

## 2018-08-16 DIAGNOSIS — K449 Diaphragmatic hernia without obstruction or gangrene: Principal | ICD-10-CM

## 2018-08-17 NOTE — Telephone Encounter (Signed)
Not on current medication list.   

## 2018-09-18 DIAGNOSIS — M7582 Other shoulder lesions, left shoulder: Secondary | ICD-10-CM | POA: Diagnosis not present

## 2018-10-30 DIAGNOSIS — M7582 Other shoulder lesions, left shoulder: Secondary | ICD-10-CM | POA: Diagnosis not present

## 2018-10-31 DIAGNOSIS — M25612 Stiffness of left shoulder, not elsewhere classified: Secondary | ICD-10-CM | POA: Diagnosis not present

## 2018-10-31 DIAGNOSIS — M6281 Muscle weakness (generalized): Secondary | ICD-10-CM | POA: Diagnosis not present

## 2018-10-31 DIAGNOSIS — M25512 Pain in left shoulder: Secondary | ICD-10-CM | POA: Diagnosis not present

## 2018-10-31 DIAGNOSIS — S43422D Sprain of left rotator cuff capsule, subsequent encounter: Secondary | ICD-10-CM | POA: Diagnosis not present

## 2018-11-05 DIAGNOSIS — M25512 Pain in left shoulder: Secondary | ICD-10-CM | POA: Diagnosis not present

## 2018-11-05 DIAGNOSIS — M25612 Stiffness of left shoulder, not elsewhere classified: Secondary | ICD-10-CM | POA: Diagnosis not present

## 2018-11-05 DIAGNOSIS — M6281 Muscle weakness (generalized): Secondary | ICD-10-CM | POA: Diagnosis not present

## 2018-11-05 DIAGNOSIS — S43422D Sprain of left rotator cuff capsule, subsequent encounter: Secondary | ICD-10-CM | POA: Diagnosis not present

## 2018-11-07 DIAGNOSIS — M25512 Pain in left shoulder: Secondary | ICD-10-CM | POA: Diagnosis not present

## 2018-11-07 DIAGNOSIS — M6281 Muscle weakness (generalized): Secondary | ICD-10-CM | POA: Diagnosis not present

## 2018-11-07 DIAGNOSIS — S43422D Sprain of left rotator cuff capsule, subsequent encounter: Secondary | ICD-10-CM | POA: Diagnosis not present

## 2018-11-07 DIAGNOSIS — M25612 Stiffness of left shoulder, not elsewhere classified: Secondary | ICD-10-CM | POA: Diagnosis not present

## 2018-11-13 DIAGNOSIS — M25612 Stiffness of left shoulder, not elsewhere classified: Secondary | ICD-10-CM | POA: Diagnosis not present

## 2018-11-13 DIAGNOSIS — S43422D Sprain of left rotator cuff capsule, subsequent encounter: Secondary | ICD-10-CM | POA: Diagnosis not present

## 2018-11-13 DIAGNOSIS — M6281 Muscle weakness (generalized): Secondary | ICD-10-CM | POA: Diagnosis not present

## 2018-11-13 DIAGNOSIS — M25512 Pain in left shoulder: Secondary | ICD-10-CM | POA: Diagnosis not present

## 2018-11-19 DIAGNOSIS — M25512 Pain in left shoulder: Secondary | ICD-10-CM | POA: Diagnosis not present

## 2018-11-19 DIAGNOSIS — M6281 Muscle weakness (generalized): Secondary | ICD-10-CM | POA: Diagnosis not present

## 2018-11-19 DIAGNOSIS — M25612 Stiffness of left shoulder, not elsewhere classified: Secondary | ICD-10-CM | POA: Diagnosis not present

## 2018-11-19 DIAGNOSIS — S43422D Sprain of left rotator cuff capsule, subsequent encounter: Secondary | ICD-10-CM | POA: Diagnosis not present

## 2018-11-26 DIAGNOSIS — M6281 Muscle weakness (generalized): Secondary | ICD-10-CM | POA: Diagnosis not present

## 2018-11-26 DIAGNOSIS — M25612 Stiffness of left shoulder, not elsewhere classified: Secondary | ICD-10-CM | POA: Diagnosis not present

## 2018-11-26 DIAGNOSIS — M25512 Pain in left shoulder: Secondary | ICD-10-CM | POA: Diagnosis not present

## 2018-11-26 DIAGNOSIS — S43422D Sprain of left rotator cuff capsule, subsequent encounter: Secondary | ICD-10-CM | POA: Diagnosis not present

## 2018-11-28 DIAGNOSIS — M6281 Muscle weakness (generalized): Secondary | ICD-10-CM | POA: Diagnosis not present

## 2018-11-28 DIAGNOSIS — M25512 Pain in left shoulder: Secondary | ICD-10-CM | POA: Diagnosis not present

## 2018-11-28 DIAGNOSIS — S43422D Sprain of left rotator cuff capsule, subsequent encounter: Secondary | ICD-10-CM | POA: Diagnosis not present

## 2018-11-28 DIAGNOSIS — M25612 Stiffness of left shoulder, not elsewhere classified: Secondary | ICD-10-CM | POA: Diagnosis not present

## 2018-12-03 DIAGNOSIS — S43422D Sprain of left rotator cuff capsule, subsequent encounter: Secondary | ICD-10-CM | POA: Diagnosis not present

## 2018-12-03 DIAGNOSIS — M25612 Stiffness of left shoulder, not elsewhere classified: Secondary | ICD-10-CM | POA: Diagnosis not present

## 2018-12-03 DIAGNOSIS — M25512 Pain in left shoulder: Secondary | ICD-10-CM | POA: Diagnosis not present

## 2018-12-03 DIAGNOSIS — M6281 Muscle weakness (generalized): Secondary | ICD-10-CM | POA: Diagnosis not present

## 2018-12-05 DIAGNOSIS — M25612 Stiffness of left shoulder, not elsewhere classified: Secondary | ICD-10-CM | POA: Diagnosis not present

## 2018-12-05 DIAGNOSIS — S43422D Sprain of left rotator cuff capsule, subsequent encounter: Secondary | ICD-10-CM | POA: Diagnosis not present

## 2018-12-05 DIAGNOSIS — M25512 Pain in left shoulder: Secondary | ICD-10-CM | POA: Diagnosis not present

## 2018-12-05 DIAGNOSIS — M6281 Muscle weakness (generalized): Secondary | ICD-10-CM | POA: Diagnosis not present

## 2018-12-10 DIAGNOSIS — M6281 Muscle weakness (generalized): Secondary | ICD-10-CM | POA: Diagnosis not present

## 2018-12-10 DIAGNOSIS — S43422D Sprain of left rotator cuff capsule, subsequent encounter: Secondary | ICD-10-CM | POA: Diagnosis not present

## 2018-12-10 DIAGNOSIS — M25512 Pain in left shoulder: Secondary | ICD-10-CM | POA: Diagnosis not present

## 2018-12-10 DIAGNOSIS — M25612 Stiffness of left shoulder, not elsewhere classified: Secondary | ICD-10-CM | POA: Diagnosis not present

## 2018-12-17 DIAGNOSIS — M25512 Pain in left shoulder: Secondary | ICD-10-CM | POA: Diagnosis not present

## 2018-12-17 DIAGNOSIS — S43422D Sprain of left rotator cuff capsule, subsequent encounter: Secondary | ICD-10-CM | POA: Diagnosis not present

## 2018-12-17 DIAGNOSIS — M25612 Stiffness of left shoulder, not elsewhere classified: Secondary | ICD-10-CM | POA: Diagnosis not present

## 2018-12-17 DIAGNOSIS — M6281 Muscle weakness (generalized): Secondary | ICD-10-CM | POA: Diagnosis not present

## 2018-12-24 DIAGNOSIS — M6281 Muscle weakness (generalized): Secondary | ICD-10-CM | POA: Diagnosis not present

## 2018-12-24 DIAGNOSIS — M25612 Stiffness of left shoulder, not elsewhere classified: Secondary | ICD-10-CM | POA: Diagnosis not present

## 2018-12-24 DIAGNOSIS — M25512 Pain in left shoulder: Secondary | ICD-10-CM | POA: Diagnosis not present

## 2018-12-24 DIAGNOSIS — S43422D Sprain of left rotator cuff capsule, subsequent encounter: Secondary | ICD-10-CM | POA: Diagnosis not present

## 2018-12-26 DIAGNOSIS — I10 Essential (primary) hypertension: Secondary | ICD-10-CM | POA: Diagnosis not present

## 2018-12-26 DIAGNOSIS — Z961 Presence of intraocular lens: Secondary | ICD-10-CM | POA: Diagnosis not present

## 2018-12-26 DIAGNOSIS — E119 Type 2 diabetes mellitus without complications: Secondary | ICD-10-CM | POA: Diagnosis not present

## 2018-12-26 DIAGNOSIS — H52223 Regular astigmatism, bilateral: Secondary | ICD-10-CM | POA: Diagnosis not present

## 2018-12-26 DIAGNOSIS — Z7984 Long term (current) use of oral hypoglycemic drugs: Secondary | ICD-10-CM | POA: Diagnosis not present

## 2018-12-26 DIAGNOSIS — H524 Presbyopia: Secondary | ICD-10-CM | POA: Diagnosis not present

## 2018-12-26 LAB — HM DIABETES EYE EXAM

## 2018-12-27 DIAGNOSIS — M6281 Muscle weakness (generalized): Secondary | ICD-10-CM | POA: Diagnosis not present

## 2018-12-27 DIAGNOSIS — S43422D Sprain of left rotator cuff capsule, subsequent encounter: Secondary | ICD-10-CM | POA: Diagnosis not present

## 2018-12-27 DIAGNOSIS — M25612 Stiffness of left shoulder, not elsewhere classified: Secondary | ICD-10-CM | POA: Diagnosis not present

## 2018-12-27 DIAGNOSIS — M25512 Pain in left shoulder: Secondary | ICD-10-CM | POA: Diagnosis not present

## 2019-01-01 DIAGNOSIS — S46012A Strain of muscle(s) and tendon(s) of the rotator cuff of left shoulder, initial encounter: Secondary | ICD-10-CM | POA: Diagnosis not present

## 2019-01-08 DIAGNOSIS — M25512 Pain in left shoulder: Secondary | ICD-10-CM | POA: Diagnosis not present

## 2019-01-09 ENCOUNTER — Encounter: Payer: Self-pay | Admitting: *Deleted

## 2019-02-07 DIAGNOSIS — K219 Gastro-esophageal reflux disease without esophagitis: Secondary | ICD-10-CM | POA: Diagnosis not present

## 2019-02-07 DIAGNOSIS — K222 Esophageal obstruction: Secondary | ICD-10-CM | POA: Diagnosis not present

## 2019-02-07 DIAGNOSIS — D132 Benign neoplasm of duodenum: Secondary | ICD-10-CM | POA: Diagnosis not present

## 2019-02-07 DIAGNOSIS — R112 Nausea with vomiting, unspecified: Secondary | ICD-10-CM | POA: Diagnosis not present

## 2019-02-07 DIAGNOSIS — E119 Type 2 diabetes mellitus without complications: Secondary | ICD-10-CM | POA: Diagnosis not present

## 2019-02-07 DIAGNOSIS — K3184 Gastroparesis: Secondary | ICD-10-CM | POA: Diagnosis not present

## 2019-02-13 DIAGNOSIS — E119 Type 2 diabetes mellitus without complications: Secondary | ICD-10-CM | POA: Diagnosis not present

## 2019-02-13 DIAGNOSIS — K3184 Gastroparesis: Secondary | ICD-10-CM | POA: Diagnosis not present

## 2019-02-13 DIAGNOSIS — R112 Nausea with vomiting, unspecified: Secondary | ICD-10-CM | POA: Diagnosis not present

## 2019-02-13 DIAGNOSIS — K219 Gastro-esophageal reflux disease without esophagitis: Secondary | ICD-10-CM | POA: Diagnosis not present

## 2019-02-13 DIAGNOSIS — R1013 Epigastric pain: Secondary | ICD-10-CM | POA: Diagnosis not present

## 2019-03-17 ENCOUNTER — Encounter: Payer: Self-pay | Admitting: *Deleted

## 2019-04-17 ENCOUNTER — Other Ambulatory Visit: Payer: Self-pay

## 2019-04-17 ENCOUNTER — Ambulatory Visit (INDEPENDENT_AMBULATORY_CARE_PROVIDER_SITE_OTHER): Payer: PPO | Admitting: Internal Medicine

## 2019-04-17 DIAGNOSIS — I10 Essential (primary) hypertension: Secondary | ICD-10-CM

## 2019-04-17 DIAGNOSIS — J4520 Mild intermittent asthma, uncomplicated: Secondary | ICD-10-CM | POA: Diagnosis not present

## 2019-04-17 DIAGNOSIS — K219 Gastro-esophageal reflux disease without esophagitis: Secondary | ICD-10-CM | POA: Diagnosis not present

## 2019-04-17 DIAGNOSIS — E7800 Pure hypercholesterolemia, unspecified: Secondary | ICD-10-CM | POA: Diagnosis not present

## 2019-04-17 DIAGNOSIS — K449 Diaphragmatic hernia without obstruction or gangrene: Secondary | ICD-10-CM

## 2019-04-17 DIAGNOSIS — M7551 Bursitis of right shoulder: Secondary | ICD-10-CM
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
## 2019-04-18 MED ORDER — ALBUTEROL SULFATE HFA 108 (90 BASE) MCG/ACT IN AERS: 1.0000 | INHALATION_SPRAY | Freq: Four times a day (QID) | RESPIRATORY_TRACT | 3 refills | Status: DC | PRN

## 2019-04-18 MED ORDER — DEXLANSOPRAZOLE 30 MG PO CPDR: 30.0000 mg | DELAYED_RELEASE_CAPSULE | ORAL | 3 refills | Status: DC

## 2019-04-18 MED ORDER — ROSUVASTATIN CALCIUM 20 MG PO TABS: 20.0000 mg | ORAL_TABLET | Freq: Every day | ORAL | 3 refills | Status: DC

## 2019-04-18 MED ORDER — ACETAMINOPHEN-CODEINE #3 300-30 MG PO TABS: 1.0000 | ORAL_TABLET | Freq: Three times a day (TID) | ORAL | 0 refills | Status: DC | PRN

## 2019-04-18 MED ORDER — LISINOPRIL 20 MG PO TABS: 20.0000 mg | ORAL_TABLET | Freq: Every day | ORAL | 3 refills | Status: DC

## 2019-04-19 MED ORDER — DICLOFENAC SODIUM 1 % TD GEL: 2.0000 g | Freq: Four times a day (QID) | TRANSDERMAL | 1 refills | Status: DC

## 2019-04-19 NOTE — Assessment & Plan Note (Signed)
Refilled Dexilant for her.

## 2019-04-19 NOTE — Assessment & Plan Note (Signed)
She reports continued pain in the right shoulder, rotator cuff tear by MRI at her orthopedist (no documentation in our system which I could readily find).  She feel that therapy and injections did not help.  She would be interested in surgery, but during the novel coronavirus pandemic has not been able to schedule with her orthopedist.  She would like something for pain.  She has tried tramadol in the past, but it gives her significant dizziness.  She has not tried tylenol #3.  I discussed with her that this will be a limited Rx only.    Plan Tylenol #3, #15 tab Voltaren gel Follow up in 2 weeks by phone to see how she is doing.

## 2019-04-19 NOTE — Patient Instructions (Signed)
Patient instructions given by phone.

## 2019-06-28 ENCOUNTER — Telehealth: Payer: Self-pay | Admitting: Internal Medicine

## 2019-06-28 MED ORDER — ACETAMINOPHEN-CODEINE #3 300-30 MG PO TABS: 1.0000 | ORAL_TABLET | Freq: Three times a day (TID) | ORAL | 0 refills | Status: DC | PRN

## 2019-06-28 NOTE — Telephone Encounter (Signed)
Called and spoke with Ms. Brotzman.  Confirmed identity with name and DOB.   She reports shoulder pain is the same. She has not seen her orthopedic surgeon.  She notes that she did not pick up Tylenol #3 and is willing to try it.  Per PDMP review, however, she did get the tylenol #3, she may have thought it was tramadol.  Will give a new trial of this medication and see if she gets any relief.    Gilles Chiquito, MD

## 2019-08-28 ENCOUNTER — Telehealth: Payer: Self-pay | Admitting: *Deleted

## 2019-08-28 NOTE — Telephone Encounter (Signed)
Received fax from [facility] requesting refill on omeprazole 20 mg daily. Removed from med list on 08/17/2018 by historical provider. Please advise. Hubbard Hartshorn, RN, BSN

## 2019-10-23 DIAGNOSIS — E119 Type 2 diabetes mellitus without complications: Secondary | ICD-10-CM | POA: Diagnosis not present

## 2019-10-23 DIAGNOSIS — K219 Gastro-esophageal reflux disease without esophagitis: Secondary | ICD-10-CM | POA: Diagnosis not present

## 2019-10-23 DIAGNOSIS — R112 Nausea with vomiting, unspecified: Secondary | ICD-10-CM | POA: Diagnosis not present

## 2019-10-23 DIAGNOSIS — K3184 Gastroparesis: Secondary | ICD-10-CM | POA: Diagnosis not present

## 2019-12-24 DIAGNOSIS — H5201 Hypermetropia, right eye: Secondary | ICD-10-CM | POA: Diagnosis not present

## 2019-12-24 DIAGNOSIS — H524 Presbyopia: Secondary | ICD-10-CM | POA: Diagnosis not present

## 2019-12-24 DIAGNOSIS — H52223 Regular astigmatism, bilateral: Secondary | ICD-10-CM | POA: Diagnosis not present

## 2019-12-24 DIAGNOSIS — I10 Essential (primary) hypertension: Secondary | ICD-10-CM | POA: Diagnosis not present

## 2019-12-24 DIAGNOSIS — E119 Type 2 diabetes mellitus without complications: Secondary | ICD-10-CM | POA: Diagnosis not present

## 2019-12-24 DIAGNOSIS — Z961 Presence of intraocular lens: Secondary | ICD-10-CM | POA: Diagnosis not present

## 2020-02-22 ENCOUNTER — Ambulatory Visit: Payer: Medicare HMO | Attending: Internal Medicine

## 2020-02-22 DIAGNOSIS — Z23 Encounter for immunization: Secondary | ICD-10-CM | POA: Insufficient documentation

## 2020-02-22 NOTE — Progress Notes (Signed)
   Covid-19 Vaccination Clinic  Name:  Colleen Medina    MRN: [id number] DOB: 1941-08-13  02/22/2020  [name] was observed post Covid-19 immunization for 15 minutes without incident. She was provided with Vaccine Information Sheet and instruction to access the V-Safe system.   [name] was instructed to call 911 with any severe reactions post vaccine: Marland Kitchen Difficulty breathing  . Swelling of face and throat  . A fast heartbeat  . A bad rash all over body  . Dizziness and weakness   Immunizations Administered    Name Date Dose VIS Date Route   Pfizer COVID-19 Vaccine 02/22/2020  9:52 AM 0.3 mL 11/29/2019 Intramuscular   Manufacturer: [place]   Lot: [id number]   [place]: [id number]

## 2020-03-14 ENCOUNTER — Ambulatory Visit: Payer: Medicare HMO | Attending: Internal Medicine

## 2020-03-14 DIAGNOSIS — Z23 Encounter for immunization: Secondary | ICD-10-CM

## 2020-03-14 NOTE — Progress Notes (Signed)
   Covid-19 Vaccination Clinic  Name:  Colleen Medina    MRN: [id number] DOB: 1941/02/16  03/14/2020  [name] was observed post Covid-19 immunization for 15 minutes without incident. She was provided with Vaccine Information Sheet and instruction to access the V-Safe system.   [name] was instructed to call 911 with any severe reactions post vaccine: Marland Kitchen Difficulty breathing  . Swelling of face and throat  . A fast heartbeat  . A bad rash all over body  . Dizziness and weakness   Immunizations Administered    Name Date Dose VIS Date Route   Pfizer COVID-19 Vaccine 03/14/2020 10:41 AM 0.3 mL 11/29/2019 Intramuscular   Manufacturer: [place]   Lot: [id number]   [place]: [id number]

## 2020-03-20 DIAGNOSIS — Z03818 Encounter for observation for suspected exposure to other biological agents ruled out: Secondary | ICD-10-CM | POA: Diagnosis not present

## 2020-03-20 DIAGNOSIS — Z20828 Contact with and (suspected) exposure to other viral communicable diseases: Secondary | ICD-10-CM | POA: Diagnosis not present

## 2020-04-22 ENCOUNTER — Other Ambulatory Visit: Payer: Self-pay | Admitting: Internal Medicine

## 2020-04-22 DIAGNOSIS — I10 Essential (primary) hypertension: Secondary | ICD-10-CM

## 2020-04-22 DIAGNOSIS — E7800 Pure hypercholesterolemia, unspecified: Secondary | ICD-10-CM

## 2020-04-28 ENCOUNTER — Inpatient Hospital Stay (HOSPITAL_COMMUNITY)
Admission: EM | Admit: 2020-04-28 | Discharge: 2020-05-01 | DRG: 074 | Disposition: A | Discharge status: Home / Self Care | Payer: Medicare HMO | Attending: Internal Medicine | Admitting: Internal Medicine

## 2020-04-28 ENCOUNTER — Encounter (HOSPITAL_COMMUNITY): Payer: Self-pay

## 2020-04-28 DIAGNOSIS — K9289 Other specified diseases of the digestive system: Secondary | ICD-10-CM | POA: Diagnosis present

## 2020-04-28 DIAGNOSIS — R112 Nausea with vomiting, unspecified: Secondary | ICD-10-CM | POA: Diagnosis present

## 2020-04-28 DIAGNOSIS — Z882 Allergy status to sulfonamides status: Secondary | ICD-10-CM

## 2020-04-28 DIAGNOSIS — Z833 Family history of diabetes mellitus: Secondary | ICD-10-CM

## 2020-04-28 DIAGNOSIS — I10 Essential (primary) hypertension: Secondary | ICD-10-CM | POA: Diagnosis present

## 2020-04-28 DIAGNOSIS — Z20822 Contact with and (suspected) exposure to covid-19: Secondary | ICD-10-CM | POA: Diagnosis present

## 2020-04-28 DIAGNOSIS — G8929 Other chronic pain: Secondary | ICD-10-CM | POA: Diagnosis present

## 2020-04-28 DIAGNOSIS — K2970 Gastritis, unspecified, without bleeding: Secondary | ICD-10-CM | POA: Diagnosis present

## 2020-04-28 DIAGNOSIS — R1084 Generalized abdominal pain: Secondary | ICD-10-CM | POA: Diagnosis not present

## 2020-04-28 DIAGNOSIS — E1143 Type 2 diabetes mellitus with diabetic autonomic (poly)neuropathy: Secondary | ICD-10-CM | POA: Diagnosis not present

## 2020-04-28 DIAGNOSIS — K5790 Diverticulosis of intestine, part unspecified, without perforation or abscess without bleeding: Secondary | ICD-10-CM | POA: Diagnosis present

## 2020-04-28 DIAGNOSIS — Z8249 Family history of ischemic heart disease and other diseases of the circulatory system: Secondary | ICD-10-CM | POA: Diagnosis not present

## 2020-04-28 DIAGNOSIS — K3184 Gastroparesis: Secondary | ICD-10-CM | POA: Diagnosis present

## 2020-04-28 DIAGNOSIS — E538 Deficiency of other specified B group vitamins: Secondary | ICD-10-CM | POA: Diagnosis not present

## 2020-04-28 DIAGNOSIS — M1990 Unspecified osteoarthritis, unspecified site: Secondary | ICD-10-CM | POA: Diagnosis present

## 2020-04-28 DIAGNOSIS — Z83438 Family history of other disorder of lipoprotein metabolism and other lipidemia: Secondary | ICD-10-CM | POA: Diagnosis not present

## 2020-04-28 DIAGNOSIS — Z880 Allergy status to penicillin: Secondary | ICD-10-CM | POA: Diagnosis not present

## 2020-04-28 DIAGNOSIS — D649 Anemia, unspecified: Secondary | ICD-10-CM | POA: Diagnosis present

## 2020-04-28 DIAGNOSIS — E785 Hyperlipidemia, unspecified: Secondary | ICD-10-CM | POA: Diagnosis present

## 2020-04-28 DIAGNOSIS — K219 Gastro-esophageal reflux disease without esophagitis: Secondary | ICD-10-CM | POA: Diagnosis present

## 2020-04-28 DIAGNOSIS — Z825 Family history of asthma and other chronic lower respiratory diseases: Secondary | ICD-10-CM | POA: Diagnosis not present

## 2020-04-28 DIAGNOSIS — Z03818 Encounter for observation for suspected exposure to other biological agents ruled out: Secondary | ICD-10-CM | POA: Diagnosis not present

## 2020-04-28 DIAGNOSIS — Z66 Do not resuscitate: Secondary | ICD-10-CM | POA: Diagnosis not present

## 2020-04-28 DIAGNOSIS — Z79899 Other long term (current) drug therapy: Secondary | ICD-10-CM

## 2020-04-28 DIAGNOSIS — R109 Unspecified abdominal pain: Secondary | ICD-10-CM | POA: Diagnosis not present

## 2020-04-28 DIAGNOSIS — Z980 Intestinal bypass and anastomosis status: Secondary | ICD-10-CM

## 2020-04-28 DIAGNOSIS — K529 Noninfective gastroenteritis and colitis, unspecified: Secondary | ICD-10-CM | POA: Diagnosis not present

## 2020-04-28 LAB — URINALYSIS, ROUTINE W REFLEX MICROSCOPIC
Bilirubin Urine: NEGATIVE
Glucose, UA: 50 mg/dL — AB
Hgb urine dipstick: NEGATIVE
Ketones, ur: 5 mg/dL — AB
Leukocytes,Ua: NEGATIVE
Nitrite: NEGATIVE
Protein, ur: NEGATIVE mg/dL
Specific Gravity, Urine: 1.011 (ref 1.005–1.030)
pH: 8 (ref 5.0–8.0)

## 2020-04-28 LAB — COMPREHENSIVE METABOLIC PANEL
ALT: 23 U/L (ref 0–44)
AST: 28 U/L (ref 15–41)
Albumin: 4.3 g/dL (ref 3.5–5.0)
Alkaline Phosphatase: 75 U/L (ref 38–126)
Anion gap: 12 (ref 5–15)
BUN: 13 mg/dL (ref 8–23)
CO2: 24 mmol/L (ref 22–32)
Calcium: 9.5 mg/dL (ref 8.9–10.3)
Chloride: 100 mmol/L (ref 98–111)
Creatinine, Ser: 0.69 mg/dL (ref 0.44–1.00)
GFR calc Af Amer: >60 mL/min (ref >60)
GFR calc non Af Amer: >60 mL/min (ref >60)
Glucose, Bld: 160 mg/dL — ABNORMAL HIGH (ref 70–99)
Potassium: 4.2 mmol/L (ref 3.5–5.1)
Sodium: 136 mmol/L (ref 135–145)
Total Bilirubin: 1.4 mg/dL — ABNORMAL HIGH (ref 0.3–1.2)
Total Protein: 7.8 g/dL (ref 6.5–8.1)

## 2020-04-28 LAB — CBC
HCT: 36.8 % (ref 36.0–46.0)
Hemoglobin: 10.9 g/dL — ABNORMAL LOW (ref 12.0–15.0)
MCH: 23.9 pg — ABNORMAL LOW (ref 26.0–34.0)
MCHC: 29.6 g/dL — ABNORMAL LOW (ref 30.0–36.0)
MCV: 80.5 fL (ref 80.0–100.0)
Platelets: 224 10*3/uL (ref 150–400)
RBC: 4.57 MIL/uL (ref 3.87–5.11)
RDW: 14.6 % (ref 11.5–15.5)
WBC: 9.6 10*3/uL (ref 4.0–10.5)
nRBC: 0 % (ref 0.0–0.2)

## 2020-04-28 LAB — CBG MONITORING, ED: Glucose-Capillary: 136 mg/dL — ABNORMAL HIGH (ref 70–99)

## 2020-04-28 LAB — LIPASE, BLOOD: Lipase: 40 U/L (ref 11–51)

## 2020-04-28 MED ORDER — MORPHINE SULFATE (PF) 4 MG/ML IV SOLN
4.0000 mg | Freq: Once | INTRAVENOUS | Status: AC
  Administered 2020-04-28: 23:00:00 4 mg via INTRAVENOUS
  Filled 2020-04-28: qty 1

## 2020-04-28 MED ORDER — IOHEXOL 9 MG/ML PO SOLN
500.0000 mL | ORAL | Status: AC
  Administered 2020-04-28: 500 mL via ORAL

## 2020-04-28 MED ORDER — SODIUM CHLORIDE 0.9 % IV BOLUS
1000.0000 mL | Freq: Once | INTRAVENOUS | Status: AC
  Administered 2020-04-28: 23:00:00 1000 mL via INTRAVENOUS

## 2020-04-28 MED ORDER — SODIUM CHLORIDE 0.9% FLUSH: 3.0000 mL | Freq: Once | INTRAVENOUS | Status: DC

## 2020-04-28 MED ORDER — ONDANSETRON HCL 4 MG/2ML IJ SOLN
4.0000 mg | Freq: Once | INTRAMUSCULAR | Status: AC
  Administered 2020-04-28: 23:00:00 4 mg via INTRAVENOUS
  Filled 2020-04-28: qty 2

## 2020-04-28 MED ORDER — IOHEXOL 9 MG/ML PO SOLN
ORAL | Status: AC
  Filled 2020-04-28: qty 500

## 2020-04-28 NOTE — ED Triage Notes (Signed)
Pt reports that she has been having generalized lower abd pain for the past few hours, vomited several times, denies fevers or diarrhea, hx of GERD.

## 2020-04-28 NOTE — ED Notes (Signed)
After giving morphine, pt's O2 sat decreased to 86% on RA. RN placed pt on 4L via [place], SpO2 now 100%
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
## 2020-04-28 NOTE — ED Provider Notes (Signed)
79yo female with nausea onset this morning, followed by vomiting and abdominal pain (generalized). Awaiting CT, reassess O2 sats (desat with Morphine).  Physical Exam  BP (!) 187/92   Pulse 80   Temp 98.8 F (37.1 C) (Oral)   Resp 20   SpO2 100%   Physical Exam Appears uncomfortable, moaning and breath holding at times. Generalized abdominal tenderness.  ED Course/Procedures     Procedures  MDM  Labs with normal WBC, CMP with total bili 1.4 otherwise unremarkable. Lipase WNL, UA positive for glucose and ketones.  CT as follows:  IMPRESSION:  1. Mild diffuse colonic wall thickening consistent with infectious  or inflammatory colitis.  2. Diverticulosis without evidence of diverticulitis.  3. Aortic Atherosclerosis (ICD10-I70.0).   Discussed results with patient who at first requests to go home however discussed with patient her degree of obvious pain at this point and recommend admission. Patient is agreeable with plan. Teaching service paged for admission. Antibiotics not ordered due to normal WBC, CT without diverticulitis but will discuss with admitting team.   Case discussed with Internal Medicine service who will consult for admission.    Tacy Learn, PA-C 04/29/20 [place], [place], DO 04/29/20 [id number]

## 2020-04-29 ENCOUNTER — Emergency Department (HOSPITAL_COMMUNITY): Payer: Medicare HMO

## 2020-04-29 ENCOUNTER — Other Ambulatory Visit: Payer: Self-pay

## 2020-04-29 DIAGNOSIS — R1084 Generalized abdominal pain: Secondary | ICD-10-CM

## 2020-04-29 DIAGNOSIS — K529 Noninfective gastroenteritis and colitis, unspecified: Secondary | ICD-10-CM | POA: Diagnosis present

## 2020-04-29 LAB — HEMOGLOBIN A1C
Hgb A1c MFr Bld: 7 % — ABNORMAL HIGH (ref 4.8–5.6)
Mean Plasma Glucose: 154.2 mg/dL

## 2020-04-29 LAB — CBG MONITORING, ED
Glucose-Capillary: 117 mg/dL — ABNORMAL HIGH (ref 70–99)
Glucose-Capillary: 106 mg/dL — ABNORMAL HIGH (ref 70–99)

## 2020-04-29 LAB — GLUCOSE, CAPILLARY
Glucose-Capillary: 108 mg/dL — ABNORMAL HIGH (ref 70–99)
Glucose-Capillary: 125 mg/dL — ABNORMAL HIGH (ref 70–99)
Glucose-Capillary: 62 mg/dL — ABNORMAL LOW (ref 70–99)
Glucose-Capillary: 84 mg/dL (ref 70–99)

## 2020-04-29 LAB — BASIC METABOLIC PANEL
Anion gap: 10 (ref 5–15)
BUN: 12 mg/dL (ref 8–23)
CO2: 25 mmol/L (ref 22–32)
Calcium: 9 mg/dL (ref 8.9–10.3)
Chloride: 100 mmol/L (ref 98–111)
Creatinine, Ser: 0.8 mg/dL (ref 0.44–1.00)
GFR calc Af Amer: >60 mL/min (ref >60)
GFR calc non Af Amer: >60 mL/min (ref >60)
Glucose, Bld: 138 mg/dL — ABNORMAL HIGH (ref 70–99)
Potassium: 4.6 mmol/L (ref 3.5–5.1)
Sodium: 135 mmol/L (ref 135–145)

## 2020-04-29 LAB — C-REACTIVE PROTEIN: CRP: 0.8 mg/dL (ref <1.0)

## 2020-04-29 LAB — CBC
HCT: 36.5 % (ref 36.0–46.0)
Hemoglobin: 10.5 g/dL — ABNORMAL LOW (ref 12.0–15.0)
MCH: 23.1 pg — ABNORMAL LOW (ref 26.0–34.0)
MCHC: 28.8 g/dL — ABNORMAL LOW (ref 30.0–36.0)
MCV: 80.4 fL (ref 80.0–100.0)
Platelets: 214 10*3/uL (ref 150–400)
RBC: 4.54 MIL/uL (ref 3.87–5.11)
RDW: 14.6 % (ref 11.5–15.5)
WBC: 10.5 10*3/uL (ref 4.0–10.5)
nRBC: 0 % (ref 0.0–0.2)

## 2020-04-29 LAB — SARS CORONAVIRUS 2 BY RT PCR (HOSPITAL ORDER, PERFORMED IN ~~LOC~~ HOSPITAL LAB): SARS Coronavirus 2: NEGATIVE

## 2020-04-29 LAB — SEDIMENTATION RATE: Sed Rate: 10 mm/hr (ref 0–22)

## 2020-04-29 MED ORDER — MORPHINE SULFATE (PF) 2 MG/ML IV SOLN
2.0000 mg | Freq: Once | INTRAVENOUS | Status: AC
  Administered 2020-04-29: 2 mg via INTRAVENOUS
  Filled 2020-04-29: qty 1

## 2020-04-29 MED ORDER — ACETAMINOPHEN 325 MG PO TABS
650.0000 mg | ORAL_TABLET | Freq: Four times a day (QID) | ORAL | Status: DC | PRN
  Administered 2020-04-30: 650 mg via ORAL
  Filled 2020-04-29: qty 2

## 2020-04-29 MED ORDER — ROSUVASTATIN CALCIUM 20 MG PO TABS
20.0000 mg | ORAL_TABLET | Freq: Every day | ORAL | Status: DC
  Administered 2020-04-29 – 2020-04-30 (×2): 20 mg via ORAL
  Filled 2020-04-29 (×2): qty 1

## 2020-04-29 MED ORDER — ALBUTEROL SULFATE (2.5 MG/3ML) 0.083% IN NEBU: 2.5000 mg | INHALATION_SOLUTION | Freq: Four times a day (QID) | RESPIRATORY_TRACT | Status: DC | PRN

## 2020-04-29 MED ORDER — IOHEXOL 300 MG/ML  SOLN
100.0000 mL | Freq: Once | INTRAMUSCULAR | Status: AC | PRN
  Administered 2020-04-29: 01:00:00 100 mL via INTRAVENOUS

## 2020-04-29 MED ORDER — ASPIRIN EC 81 MG PO TBEC
81.0000 mg | DELAYED_RELEASE_TABLET | Freq: Every day | ORAL | Status: DC
  Administered 2020-04-29 – 2020-05-01 (×3): 81 mg via ORAL
  Filled 2020-04-29 (×3): qty 1

## 2020-04-29 MED ORDER — ENOXAPARIN SODIUM 40 MG/0.4ML ~~LOC~~ SOLN
40.0000 mg | Freq: Every day | SUBCUTANEOUS | Status: DC
  Administered 2020-04-29 – 2020-05-01 (×3): 40 mg via SUBCUTANEOUS
  Filled 2020-04-29 (×3): qty 0.4

## 2020-04-29 MED ORDER — HYDROMORPHONE HCL 1 MG/ML IJ SOLN: 1.0000 mg | INTRAMUSCULAR | Status: DC | PRN

## 2020-04-29 MED ORDER — SODIUM CHLORIDE 0.9 % IV SOLN: INTRAVENOUS | Status: AC

## 2020-04-29 MED ORDER — LISINOPRIL 20 MG PO TABS
20.0000 mg | ORAL_TABLET | Freq: Every day | ORAL | Status: DC
  Administered 2020-04-29 – 2020-04-30 (×2): 20 mg via ORAL
  Filled 2020-04-29 (×2): qty 1

## 2020-04-29 MED ORDER — PANTOPRAZOLE SODIUM 40 MG IV SOLR
40.0000 mg | Freq: Every day | INTRAVENOUS | Status: DC
  Administered 2020-04-29 – 2020-04-30 (×2): 40 mg via INTRAVENOUS
  Filled 2020-04-29 (×2): qty 40

## 2020-04-29 MED ORDER — ACETAMINOPHEN 650 MG RE SUPP: 650.0000 mg | Freq: Four times a day (QID) | RECTAL | Status: DC | PRN

## 2020-04-29 MED ORDER — ALBUTEROL SULFATE (2.5 MG/3ML) 0.083% IN NEBU: 2.5000 mg | INHALATION_SOLUTION | RESPIRATORY_TRACT | Status: DC | PRN

## 2020-04-29 MED ORDER — INSULIN ASPART 100 UNIT/ML ~~LOC~~ SOLN
0.0000 [IU] | SUBCUTANEOUS | Status: DC
  Administered 2020-04-29: 1 [IU] via SUBCUTANEOUS
  Administered 2020-04-30: 2 [IU] via SUBCUTANEOUS

## 2020-04-29 MED ORDER — SODIUM CHLORIDE 0.9% FLUSH
3.0000 mL | Freq: Two times a day (BID) | INTRAVENOUS | Status: DC
  Administered 2020-04-29 – 2020-04-30 (×5): 3 mL via INTRAVENOUS

## 2020-04-29 NOTE — Hospital Course (Addendum)
Admitted 04/28/2020  Allergies: Sulfonamide derivatives and Penicillins Pertinent Hx: Chronic constipation, diverticulosis, HTN, HLD, DM2, gastroparesia s/p bypass gastrojejunostomy 2012, GERD, asthma, osteopenia  79 y.o. female p/w abdominal pain, N/V  * Colitis: N/V, non bloody diarrhea, abdominal pain. Similar to prior episodes. Afebrile, no leukocytosis. CT abdomen: mild diffuse colonic wall thickening consistent with infectious or inflammatory colitis. Lipase normal at 40 Received morphine, Zofran, IV fluid in ED.  C. difficile and GI panel pending. Desated after 4 mg of morphine. Giving IVF, pain control and keep npo.If no improvement or develops sign of infection, will consider Abx.   *Hx of dysphagia, gastroparesia: s/p gastrojejunostomy 2012. Reports chronic constipation. Some cyclic N/V and diarrhea. Last EGD 2019. Last GI visit at [facility] with Dr. Oletta Lamas 4-5 months ago. No data in care everywhere. May need evaluation for celiac or IBD if not performed before  *Prolonged QT: Trying to avoid QT prolonging antiemetic as much as possible  3/27 covid vaccine Home Meds: Consults: none  Meds: Protonix, Albuterol, SSI, pen dilaudid, (home meds when po):ASA, lisinopril, Crestor  VTE ppx: Lovenox IVF: NS 121ml/h x10h Diet: N.p.o.

## 2020-04-29 NOTE — Progress Notes (Signed)
Brief History: Colleen Medina is a 79 year old female with past medical history significant for type II diabetes with gastroparesis, gastrojejunostomy in 2012, GERD, diverticulosis, HTN, hyperlipidemia and asthma, who presented to the emergency department with a one day history of nausea and vomiting.  Subjective:  Colleen Medina was evaluated and examined bedside in the emergency department this morning. Patient was in mild distress. She states that she has had some emesis since coming into the emergency department. Patient states that she is feeling better and denies any nausea or vomiting this morning. Patient is experiencing diffuse mild abdominal pain. Patient denies any fever, chills, hematemesis, diarrhea, hematochezia. She has not been around anyone with similar symptoms, or any other sick contacts. She stated that her stools "may have been a little darker" lately. She also stated that this episode is very similar to episodes that she has had in the past. She said the last episode was 2 years ago.  Objective:  Vital signs in last 24 hours: Vitals:   04/29/20 0700 04/29/20 0800 04/29/20 0900 04/29/20 1000  BP: 103/60 (!) 119/59 116/64 119/69  Pulse: 66 66 63 68  Resp: 13 14 13 15   Temp:  98.6 F (37 C)    TempSrc:      SpO2: 97% 99% 96% 98%  Weight:      Height:       Weight change:   Intake/Output Summary (Last 24 hours) at 04/29/2020 1035 Last data filed at 04/29/2020 0416 Gross per 24 hour  Intake 1000 ml  Output -  Net 1000 ml   Physical Exam  Constitutional:      General: Well appearing. In intermittent acute distress. HENT:     Head: Normocephalic and atraumatic.     Mouth/Throat:     Mouth: Mucous membranes are moist.  Eyes: Extra ocular movements in tact. Cardiovascular: S1, normal. S2, normal. Regular rate and rhythm. No murmurs, rubs, or gallops. Pulmonary: Normal respiratory effort. Lungs clear to ascultation throughout. Abdominal: Soft, mildly tender to  deep palpation. Bowel sounds appreciated; hyperactive on left side. Musculoskeletal: No tenderness, deformity or signs of injury. Normal range of motion.  Skin: Skin is warm and dry.  Neurological: Alert and oriented. Psychiatric: Mood and affect appropriate.    Assessment/Plan:  Active Problems:   Generalized abdominal pain   Colitis   Colleen Medina is a 79 year old female with a past medical history significant for type II diabetes with gastroparesis, gastrojejunostomy in 2012 , GERD, diverticulosis, HTN, hyperlipidemia, and asthma, who presented for a 1 day history of nausea and vomiting and was found to have mild diffuse colitis on CT.   # Intractable nausea and vomiting # Colitis  Patient presented with 1 day history of nausea and vomiting without fevers, chills, or diarrhea. Patient has experienced similar episodes in the past, with the most recent one occurring in 2019. Patient has history of gastroparesis, gastrojejunostomy, GERD, and esophageal stricture. CT imaging revealed mild diffuse colonic wall thickening consistent with infectious or inflammatory colitis and diverticulosis without evidence of diverticulitis. Patient is unaware of any previous IBD GI work-up. Patient is seen by [facility] GI outpatient. For previous episodes, she has been seen by [facility] GI outpatient for treatment and resolution. Assessment: Patient has extensive past GI medical history. She presented to the emergency room with nausea, vomiting, and diffuse abdominal tenderness. This acute presentation could be due to gastroparesis, especially considering patients past medical history. The episodic nature as well as the CT imaging  results are concerning for IBD, such as Crohn's disease.Infectious etiology must be considered, but unlikely given the patient presenting afebrile, without leukocytosis, and without diarrhea. Esophageal stricture is highly unlikely as patient has been able to tolerate swallowing liquids and  solids during this episode but must be considered given past medical history. Plan: - Patient is improving with current pain management. Continue pain management. - C. Diff and stool study pending. - CRP and ESR lab results pending. - Avoid QT prolonging anti-emetics given admission EKG, so holding home zofran and reglan  - Dilaudid 52m IV q4h PRN - Pantoprazole 436mIV daily - Maintenance fluids - Called [facility] for patient records. Awaiting response. - Advance diet to carb modified diet: thin consistency. - Potentially consult GI depending on progression and how patient tolerates diet.   # Type II diabetes Last A1c diet-controlled is 7.0. Most recent CBG is 106.  Plan: - SSI   # Hypertension BP elevated on admission to 175/86. Most recently improved to 110-120s/60s. Suspect blood pressure improved with pain control in ED.  Plan: - ASA 8176m lisinopril 51m76mily - rosuvastatin 51mg86mly  Diet - Carb modified: Thin consistency. DVT ppx - enoxaparin 40mg 15m daily CODE STATUS - DNR   LOS: 0 days   FranciNanetta Battycal Student 04/29/2020, 10:35 AM

## 2020-04-29 NOTE — Progress Notes (Signed)
Patient arrived to 6N19, aox4, VSS, no complaints of pain, oriented to room, call bell and use of bed controls. Will continue to monitor.
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
## 2020-04-29 NOTE — ED Notes (Signed)
Lunch Tray Ordered @ 1043. 

## 2020-04-29 NOTE — ED Notes (Signed)
Pt has had no loose stools in 12 hours.

## 2020-04-30 DIAGNOSIS — D649 Anemia, unspecified: Secondary | ICD-10-CM | POA: Diagnosis present

## 2020-04-30 DIAGNOSIS — Z8249 Family history of ischemic heart disease and other diseases of the circulatory system: Secondary | ICD-10-CM | POA: Diagnosis not present

## 2020-04-30 DIAGNOSIS — I10 Essential (primary) hypertension: Secondary | ICD-10-CM | POA: Diagnosis present

## 2020-04-30 DIAGNOSIS — K529 Noninfective gastroenteritis and colitis, unspecified: Secondary | ICD-10-CM | POA: Diagnosis present

## 2020-04-30 DIAGNOSIS — R112 Nausea with vomiting, unspecified: Secondary | ICD-10-CM | POA: Diagnosis present

## 2020-04-30 DIAGNOSIS — E538 Deficiency of other specified B group vitamins: Secondary | ICD-10-CM | POA: Diagnosis present

## 2020-04-30 DIAGNOSIS — K3184 Gastroparesis: Secondary | ICD-10-CM | POA: Diagnosis present

## 2020-04-30 DIAGNOSIS — Z20822 Contact with and (suspected) exposure to covid-19: Secondary | ICD-10-CM | POA: Diagnosis present

## 2020-04-30 DIAGNOSIS — Z880 Allergy status to penicillin: Secondary | ICD-10-CM | POA: Diagnosis not present

## 2020-04-30 DIAGNOSIS — Z882 Allergy status to sulfonamides status: Secondary | ICD-10-CM | POA: Diagnosis not present

## 2020-04-30 DIAGNOSIS — Z980 Intestinal bypass and anastomosis status: Secondary | ICD-10-CM | POA: Diagnosis not present

## 2020-04-30 DIAGNOSIS — R1084 Generalized abdominal pain: Secondary | ICD-10-CM | POA: Diagnosis not present

## 2020-04-30 DIAGNOSIS — Z825 Family history of asthma and other chronic lower respiratory diseases: Secondary | ICD-10-CM | POA: Diagnosis not present

## 2020-04-30 DIAGNOSIS — Z833 Family history of diabetes mellitus: Secondary | ICD-10-CM | POA: Diagnosis not present

## 2020-04-30 DIAGNOSIS — K5790 Diverticulosis of intestine, part unspecified, without perforation or abscess without bleeding: Secondary | ICD-10-CM | POA: Diagnosis present

## 2020-04-30 DIAGNOSIS — K2970 Gastritis, unspecified, without bleeding: Secondary | ICD-10-CM | POA: Diagnosis present

## 2020-04-30 DIAGNOSIS — Z66 Do not resuscitate: Secondary | ICD-10-CM | POA: Diagnosis not present

## 2020-04-30 DIAGNOSIS — G8929 Other chronic pain: Secondary | ICD-10-CM | POA: Diagnosis present

## 2020-04-30 DIAGNOSIS — Z79899 Other long term (current) drug therapy: Secondary | ICD-10-CM | POA: Diagnosis not present

## 2020-04-30 DIAGNOSIS — E1143 Type 2 diabetes mellitus with diabetic autonomic (poly)neuropathy: Secondary | ICD-10-CM | POA: Diagnosis present

## 2020-04-30 DIAGNOSIS — Z83438 Family history of other disorder of lipoprotein metabolism and other lipidemia: Secondary | ICD-10-CM | POA: Diagnosis not present

## 2020-04-30 DIAGNOSIS — E785 Hyperlipidemia, unspecified: Secondary | ICD-10-CM | POA: Diagnosis present

## 2020-04-30 DIAGNOSIS — K219 Gastro-esophageal reflux disease without esophagitis: Secondary | ICD-10-CM | POA: Diagnosis present

## 2020-04-30 DIAGNOSIS — K9289 Other specified diseases of the digestive system: Secondary | ICD-10-CM | POA: Diagnosis present

## 2020-04-30 DIAGNOSIS — M1990 Unspecified osteoarthritis, unspecified site: Secondary | ICD-10-CM | POA: Diagnosis present

## 2020-04-30 LAB — GASTROINTESTINAL PANEL BY PCR, STOOL (REPLACES STOOL CULTURE)

## 2020-04-30 LAB — CBC WITH DIFFERENTIAL/PLATELET
Abs Immature Granulocytes: 0.01 10*3/uL (ref 0.00–0.07)
Basophils Absolute: 0 10*3/uL (ref 0.0–0.1)
Basophils Relative: 0 %
Eosinophils Absolute: 0.1 10*3/uL (ref 0.0–0.5)
Eosinophils Relative: 1 %
HCT: 37.2 % (ref 36.0–46.0)
Hemoglobin: 10.8 g/dL — ABNORMAL LOW (ref 12.0–15.0)
Immature Granulocytes: 0 %
Lymphocytes Relative: 21 %
Lymphs Abs: 1.2 10*3/uL (ref 0.7–4.0)
MCH: 23.4 pg — ABNORMAL LOW (ref 26.0–34.0)
MCHC: 29 g/dL — ABNORMAL LOW (ref 30.0–36.0)
MCV: 80.5 fL (ref 80.0–100.0)
Monocytes Absolute: 0.5 10*3/uL (ref 0.1–1.0)
Monocytes Relative: 8 %
Neutro Abs: 3.9 10*3/uL (ref 1.7–7.7)
Neutrophils Relative %: 70 %
Platelets: 226 10*3/uL (ref 150–400)
RBC: 4.62 MIL/uL (ref 3.87–5.11)
RDW: 14.8 % (ref 11.5–15.5)
WBC: 5.6 10*3/uL (ref 4.0–10.5)
nRBC: 0 % (ref 0.0–0.2)

## 2020-04-30 LAB — BASIC METABOLIC PANEL
Anion gap: 8 (ref 5–15)
BUN: 16 mg/dL (ref 8–23)
CO2: 27 mmol/L (ref 22–32)
Calcium: 8.6 mg/dL — ABNORMAL LOW (ref 8.9–10.3)
Chloride: 103 mmol/L (ref 98–111)
Creatinine, Ser: 0.85 mg/dL (ref 0.44–1.00)
GFR calc Af Amer: >60 mL/min (ref >60)
GFR calc non Af Amer: >60 mL/min (ref >60)
Glucose, Bld: 134 mg/dL — ABNORMAL HIGH (ref 70–99)
Potassium: 3.8 mmol/L (ref 3.5–5.1)
Sodium: 138 mmol/L (ref 135–145)

## 2020-04-30 LAB — IRON AND TIBC
Iron: 59 ug/dL (ref 28–170)
Saturation Ratios: 18 % (ref 10.4–31.8)
TIBC: 330 ug/dL (ref 250–450)
UIBC: 271 ug/dL

## 2020-04-30 LAB — GLUCOSE, CAPILLARY
Glucose-Capillary: 101 mg/dL — ABNORMAL HIGH (ref 70–99)
Glucose-Capillary: 105 mg/dL — ABNORMAL HIGH (ref 70–99)
Glucose-Capillary: 114 mg/dL — ABNORMAL HIGH (ref 70–99)
Glucose-Capillary: 114 mg/dL — ABNORMAL HIGH (ref 70–99)
Glucose-Capillary: 155 mg/dL — ABNORMAL HIGH (ref 70–99)

## 2020-04-30 LAB — VITAMIN B12: Vitamin B-12: 122 pg/mL — ABNORMAL LOW (ref 180–914)

## 2020-04-30 MED ORDER — PROMETHAZINE HCL 25 MG RE SUPP
25.0000 mg | Freq: Once | RECTAL | Status: AC
  Administered 2020-04-30: 25 mg via RECTAL
  Filled 2020-04-30: qty 1

## 2020-04-30 MED ORDER — VITAMIN B-12 1000 MCG PO TABS
1000.0000 ug | ORAL_TABLET | Freq: Every day | ORAL | Status: DC
  Administered 2020-04-30 – 2020-05-01 (×2): 1000 ug via ORAL
  Filled 2020-04-30 (×2): qty 1

## 2020-04-30 MED ORDER — PROMETHAZINE HCL 25 MG PO TABS: 12.5000 mg | ORAL_TABLET | Freq: Three times a day (TID) | ORAL | Status: DC | PRN

## 2020-04-30 MED ORDER — METOCLOPRAMIDE HCL 5 MG PO TABS
5.0000 mg | ORAL_TABLET | Freq: Three times a day (TID) | ORAL | Status: DC
  Administered 2020-04-30 – 2020-05-01 (×3): 5 mg via ORAL
  Filled 2020-04-30 (×3): qty 1

## 2020-04-30 MED ORDER — RAMELTEON 8 MG PO TABS
8.0000 mg | ORAL_TABLET | Freq: Every day | ORAL | Status: DC
  Administered 2020-04-30 (×2): 8 mg via ORAL
  Filled 2020-04-30 (×3): qty 1

## 2020-04-30 MED ORDER — PANTOPRAZOLE SODIUM 40 MG PO TBEC
40.0000 mg | DELAYED_RELEASE_TABLET | Freq: Every day | ORAL | Status: DC
  Administered 2020-05-01: 40 mg via ORAL
  Filled 2020-04-30: qty 1
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
## 2020-04-30 NOTE — Progress Notes (Signed)
Brief History:  Colleen Medina is a 79 year old female with past medical history significant for type II diabetes with gastroparesis, gastrojejunostomy in 2012, GERD,diverticulosis,HTN, hyperlipidemia and asthma, who presented to the emergency department with a one day history of nausea and vomiting.  Subjective:  Colleen Medina was evaluated and examined this morning at bedside. Patient was laying in bed in no apparent distress. She reports that she is still feeling nauseous, but has not had any episodes of vomiting since yesterday. Patient still experiencing diffuse abdominal pain. She states that she was able to eat some of her breakfast.  Objective:  Vital signs in last 24 hours: Vitals:   04/29/20 1257 04/29/20 1900 04/29/20 2122 04/30/20 0418  BP: (!) 142/67 (!) 150/73 (!) 151/72 (!) 147/75  Pulse: 66 61 73 (!) 58  Resp: 16 18  18   Temp: 98.6 F (37 C) 98.4 F (36.9 C) 98.1 F (36.7 C) 98.3 F (36.8 C)  TempSrc: Oral Oral Oral   SpO2: 97% 99% 100% 97%  Weight:      Height:       Weight change:   Intake/Output Summary (Last 24 hours) at 04/30/2020 0803 Last data filed at 04/30/2020 0424 Gross per 24 hour  Intake 1356.67 ml  Output 1000 ml  Net 356.67 ml   Physical Exam  Constitutional: Well appearing. In no apparent distress. Head: Normocephalicand atraumatic. Eyes: Extra ocular movements in tact. Cardiovascular: S1, normal. S2, normal. Regular rate and rhythm. No murmurs, rubs, or gallops. Pulmonary: Normal respiratory effort. Lungs clear to ascultation throughout. Abdominal: Soft, mildly tender to deep palpation. Normoactive bowel sounds appreciated. Musculoskeletal: No tenderness,deformityor signs of injury.Normal range of motion.  Skin: Skin is warmand dry.  Neurological: Alert and oriented. Psychiatric: Mood and affect appropriate.    Assessment/Plan:  Active Problems:   Generalized abdominal pain   Colitis  Colleen Medina is a 79 year old  female with a past medical history significant for type II diabetes with gastroparesis, gastrojejunostomy in 2012 , GERD,diverticulosis,HTN, hyperlipidemia, and asthma, who presented for a 1 day history of nausea and vomiting and was found to have mild diffuse colitis on CT.   # Intractable nausea and vomiting # Colitis  Patient presented with 1 day history of nausea and vomiting without fevers, chills, or diarrhea. Patient has experienced similar episodes in the past, with the most recent one occurring in 2019. Patient has history of gastroparesis, gastrojejunostomy, GERD, and esophageal stricture. CT imaging revealed mild diffuse colonic wall thickening consistent with infectious or inflammatory colitis and diverticulosis without evidence of diverticulitis. Patient is unaware of any previous IBD GI work-up. Patient is seen by [facility] GI outpatient. ESR and CRP returned within normal limits. Awaiting stool culture results. Assessment: Patient has extensive GI history. Initial presentation along with CT imaging interpretation made gastroparesis vs. Inflammatory colitis high on the differential. ESR and CRP within normal limits making inflammatory etiology less likely. This acute episode can likely be attributed to her history of gastroparesis, or potential GI dysmotility post gastrojejunostomy. Plan: - Start Reglan 5 mg, 3 times daily before meals. - ECG. Patient QT prolonged on admission. Repeat ECG revealed normal QT interval. Patient started on Reglan and phenergan. - Pantoprazole 40 mg IV daily - Goals towards discharge if tolerating food well. Will need to see GI outpatient.   # Chronic Anemia Patient has chronic history of anemia. This is likely due to her extensive GI history and gastrojejunostomy. CBC revealed Hgb 10.8 (baseline) and MCV of 80.5.  Ordered B12, Iron and TIBC, and Folate to further assess anemia. B12 is low at 122, iron studies within normal limits. Awaiting folate  results. Plan: - Supplement B12. - Awaiting Folate results.   # Type II diabetes Last A1c diet-controlled is 7.0. Most recent CBG is 106. Plan: - SSI   # Hypertension BP elevated on admission to 175/86. Most recently improved to 110-120s/60s. Suspect blood pressure improved with pain control in ED. Plan: - ASA 42m - lisinopril 236mdaily - rosuvastatin 2071maily  Diet - Carb modified fluid consistency: Thin. DVT ppx - enoxaparin 52m62mbQ daily CODE STATUS - DNR     LOS: 1 day   FranNanetta Battydical Student 04/30/2020, 8:03 AM

## 2020-05-01 LAB — GLUCOSE, CAPILLARY
Glucose-Capillary: 106 mg/dL — ABNORMAL HIGH (ref 70–99)
Glucose-Capillary: 107 mg/dL — ABNORMAL HIGH (ref 70–99)
Glucose-Capillary: 96 mg/dL (ref 70–99)

## 2020-05-01 MED ORDER — CYANOCOBALAMIN 1000 MCG PO TABS: 1000.0000 ug | ORAL_TABLET | Freq: Every day | ORAL | 1 refills | Status: DC

## 2020-05-01 MED ORDER — METOCLOPRAMIDE HCL 5 MG PO TABS: 5.0000 mg | ORAL_TABLET | Freq: Three times a day (TID) | ORAL | 0 refills | Status: DC

## 2020-05-01 NOTE — Discharge Instructions (Signed)
Ms. Eastlack,  It was a pleasure meeting you and we are happy you are feeling better! You were admitted to the hospital after developing severe nausea and vomiting with abdominal pain. While here, you had a CT scan that showed general inflammation. Please make sure to follow up with your GI doctor and primary care doctor in the next 1 week.

## 2020-05-01 NOTE — Plan of Care (Signed)

## 2020-05-01 NOTE — Progress Notes (Signed)
[place] to be D/C'd  per MD order. Discussed with the patient and all questions fully answered.  VSS, Skin clean, dry and intact without evidence of skin break down, no evidence of skin tears noted.  IV catheter discontinued intact. Site without signs and symptoms of complications. Dressing and pressure applied.  An After Visit Summary was printed and given to the patient. Patient received prescription.  D/c education completed with patient/family including follow up instructions, medication list, d/c activities limitations if indicated, with other d/c instructions as indicated by MD - patient able to verbalize understanding, all questions fully answered.   Patient instructed to return to ED, call 911, or call MD for any changes in condition.   Patient to be escorted via [place], and D/C home via private auto.

## 2020-05-01 NOTE — Progress Notes (Signed)
Brief History:  Colleen Medina is a 79 year oldfemalewith past medical history significant fortype II diabetes with gastroparesis, gastrojejunostomy in 2012, GERD,diverticulosis,HTN, hyperlipidemia and asthma, who presented to the emergency department with a one day historyof nausea and vomiting.  Subjective:  Colleen Medina was evaluated and examined this morning bedside. Patient was eating breakfast when entering the room. She says that she has been able to tolerate food, with some mild abdominal pain. Patient reports that she is not experiencing nausea, and has not had any episodes of vomiting. We discussed with the patient the plan to continue Reglan outpatient and follow up with PCP within next two weeks.  Objective:  Vital signs in last 24 hours: Vitals:   04/30/20 0418 04/30/20 1500 04/30/20 2001 05/01/20 0420  BP: (!) 147/75 116/73 (!) 103/53 108/63  Pulse: (!) 58 66 74 60  Resp: _0 Temp: 98.3 F (36.8 C) 98.4 F (36.9 C) 98 F (36.7 C) 97.7 F (36.5 C)  TempSrc:  Oral Oral Oral  SpO2: 97% 97% 99% 100%  Weight:    54.4 kg  Height:       Weight change:   Intake/Output Summary (Last 24 hours) at 05/01/2020 1106 Last data filed at 05/01/2020 0421 Gross per 24 hour  Intake 400 ml  Output 400 ml  Net 0 ml   Physical Exam Constitutional: Well appearing. In no apparent distress. Head: Normocephalicand atraumatic. Eyes:Extra ocular movements in tact. Cardiovascular:S1, normal. S2, normal. Regular rate and rhythm. No murmurs, rubs, or gallops. Pulmonary:Normal respiratory effort. Lungs clear to ascultation throughout. Abdominal:Soft, non-tender to palpation. No distension. Normoactive bowel sounds appreciated. Musculoskeletal: No tenderness,deformityor signs of injury.Normal range of motion.  Skin: Skin is warmand dry.  Neurological:Alert and oriented. Psychiatric:Mood and affect appropriate.   Assessment/Plan:  Active Problems:  Generalized abdominal pain   Colitis   Intractable nausea and vomiting   Colleen Medina is a 79 year old femalewitha past medical history significant fortype II diabetes with gastroparesis, gastrojejunostomy in 2012 , GERD,diverticulosis,HTN, hyperlipidemia, and asthma, who presented for a 1 day history of nausea, vomiting, and mild abdominal pain.   #Intractable nausea and vomiting #Colitis  Patient presented with 1 day history of nausea and vomiting without fevers, chills, or diarrhea. Patient has experienced similar episodes in the past, with the most recent one occurring in 2019. Patient has history of gastroparesis, gastrojejunostomy, GERD, and esophageal stricture. CT imaging revealed mild diffuse colonic wall thickening consistent with infectious or inflammatory colitisand diverticulosis without evidence of diverticulitis.Patient is unaware of any previous IBD GI work-up. Patient is seen by [facility] GI outpatient. ESR and CRP returned within normal limits. Reglan introduced yesterday, patient has been able to eat and no longer reports nausea. Assessment: Patient is improving well on Reglan. She is able to tolerate food, and nausea has subsided. This acute episode can likely be attributed to her history of gastroparesis, or potential GI dysmotility post gastrojejunostomy. Plan: - Discharge home today. - Continue Reglan outpatient for a total of length of 10 days (including two days inpatient). - Continue outpatient medications. - Will need to see [facility], her primary GI physicians outpatient. - Outpatient PCP appointment in the next one to two weeks to monitor progression.   # Chronic Anemia Patient has chronic history of anemia. This is likely due to her extensive GI history and gastrojejunostomy. CBC revealed Hgb 10.8 (baseline) and MCV of 80.5. Ordered B12, Iron and TIBC, and Folate to further assess anemia. B12 is low at 122,  iron studies within normal limits. Awaiting  folate results. Plan: - Continue supplemental B12 outpatient. - Awaiting Folate results. Follow up outpatient.   #Type II diabetes Last A1c diet-controlledis 7.0.Most recentCBGis106. Plan: - SSI   # Hypertension BP elevated on admission to 175/86. Most recently improved to 110-120s/60s. Suspect blood pressure improved with pain control in ED. Plan: - ASA 70m - lisinopril 227mdaily - rosuvastatin 2049maily  Diet -Carb modified fluid consistency: Thin. DVT ppx - enoxaparin 45m45mbQ daily CODE STATUS - DNR    LOS: 2 days   FranNanetta Battydical Student 05/01/2020, 11:06 AM

## 2020-05-01 NOTE — Discharge Summary (Signed)
Name: Colleen Medina MRN: [id number] DOB: 04-Apr-1941 79 y.o. PCP: Sid Falcon, MD  Date of Admission: 04/28/2020  8:13 PM Date of Discharge: 05/01/2020 Attending Physician: Sid Falcon, MD  Discharge Diagnosis: 1. Intractable Nausea and Vomiting 2. Colitis  3. Chronic Normocytic Anemia   Discharge Medications: Allergies as of 05/01/2020      Reactions   Sulfonamide Derivatives Anaphylaxis   Penicillins Hives, Other (See Comments)   All over the body Has patient had a PCN reaction causing immediate rash, facial/tongue/throat swelling, SOB or lightheadedness with hypotension: No Has patient had a PCN reaction causing severe rash involving mucus membranes or skin necrosis: No Has patient had a PCN reaction that required hospitalization: Unknown Has patient had a PCN reaction occurring within the last 10 years: No If all of the above answers are "NO", then may proceed with Cephalosporin use.      Medication List    STOP taking these medications   acetaminophen-codeine 300-30 MG tablet Commonly known as: TYLENOL #3   ibuprofen 400 MG tablet Commonly known as: ADVIL     TAKE these medications   albuterol 108 (90 Base) MCG/ACT inhaler Commonly known as: ProAir HFA Inhale 1-2 puffs into the lungs every 6 (six) hours as needed for shortness of breath.   aspirin EC 81 MG tablet Take 1 tablet (81 mg total) by mouth daily.   cyanocobalamin 1000 MCG tablet Take 1 tablet (1,000 mcg total) by mouth daily.   Dexlansoprazole 30 MG capsule Commonly known as: Dexilant Take 1 capsule (30 mg total) by mouth every morning. AT  LEAST  30  MINUTES  BEFORE  BREAKFAST What changed:   when to take this  reasons to take this  additional instructions   diclofenac sodium 1 % Gel Commonly known as: VOLTAREN Apply 2 g topically 4 (four) times daily. To right shoulder   fluticasone 50 MCG/ACT nasal spray Commonly known as: FLONASE Place 1 spray into both nostrils  daily. What changed:   when to take this  reasons to take this   lisinopril 20 MG tablet Commonly known as: ZESTRIL Take 1 tablet by mouth once daily   metoCLOPramide 5 MG tablet Commonly known as: REGLAN Take 1 tablet (5 mg total) by mouth 3 (three) times daily before meals for 10 days. What changed:   when to take this  reasons to take this   nystatin cream Commonly known as: MYCOSTATIN Apply 1 application topically 4 (four) times daily. To roof of mouth   ondansetron 4 MG tablet Commonly known as: ZOFRAN Take 4 mg by mouth every 8 (eight) hours as needed for nausea or vomiting.   Phenadoz 25 MG suppository Generic drug: promethazine Place 25 mg rectally every 6 (six) hours as needed for nausea or vomiting.   rosuvastatin 20 MG tablet Commonly known as: CRESTOR TAKE 1 TABLET BY MOUTH AT BEDTIME   triamcinolone cream 0.1 % Commonly known as: KENALOG Apply 1 application topically 4 (four) times daily. To roof of mouth       Disposition and follow-up:   Ms.Mariangela L Fullington was discharged from [facility] in Good condition.  At the hospital follow up visit please address:  1.    Please encourage GI follow up  Please follow up Folate, replenish if low  2.  Labs / imaging needed at time of follow-up: CBC   3.  Pending labs/ test needing follow-up: Folate   Follow-up Appointments: Follow-up Information  Sid Falcon, MD. Go to.   Specialty: Internal Medicine Why: They will call you to make an appointment. Please call them if you don't hear from them in a few days.  Contact information: [place] [address] [place] [facility] Course by problem list: 1. Intractable Nausea and Vomiting Patient presented with 1 day history of nausea and vomiting without fevers, chills, or diarrhea. Patient has experienced similar episodes in the past, with the most recent one occurring in 2019. Reglan introduced  yesterday, patient has been able to eat and no longer reports nausea. This acute episode can likely be attributed to her history of gastroparesis, orpotentialGI dysmotility postgastrojejunostomy.  2. Colitis  CT imaging revealed mild diffuse colonic wall thickening consistent with infectious or inflammatory colitisand diverticulosis without evidence of diverticulitis.Patient is unaware of any previous IBD GI work-up. ESR and CRP returned within normal limits. Patient is seen by [facility] GI outpatient.Recommend outpatient follow up with colonoscopy.   3. Chronic Normocytic Anemia  Likely multifactorial, including B12 deficiency. Replacement started during this admission. Folate still pending.   Discharge Vitals:   BP 108/63 (BP Location: Right Arm)   Pulse 60   Temp 97.7 F (36.5 C) (Oral)   Resp 17   Ht 4' 11"  (1.499 m)   Wt 54.4 kg   SpO2 100%   BMI 24.22 kg/m   Pertinent Labs, Studies, and Procedures:   CBC Latest Ref Rng & Units 04/30/2020 04/29/2020 04/28/2020  WBC 4.0 - 10.5 K/uL 5.6 10.5 9.6  Hemoglobin 12.0 - 15.0 g/dL 10.8(L) 10.5(L) 10.9(L)  Hematocrit 36.0 - 46.0 % 37.2 36.5 36.8  Platelets 150 - 400 K/uL 226 214 224   BMP Latest Ref Rng & Units 04/30/2020 04/29/2020 04/28/2020  Glucose 70 - 99 mg/dL 134(H) 138(H) 160(H)  BUN 8 - 23 mg/dL 16 12 13   Creatinine 0.44 - 1.00 mg/dL 0.85 0.80 0.69  BUN/Creat Ratio 12 - 28 - - -  Sodium 135 - 145 mmol/L 138 135 136  Potassium 3.5 - 5.1 mmol/L 3.8 4.6 4.2  Chloride 98 - 111 mmol/L 103 100 100  CO2 22 - 32 mmol/L 27 25 24   Calcium 8.9 - 10.3 mg/dL 8.6(L) 9.0 9.5   Lab Results  Component Value Date   VITAMINB12 122 (L) 04/30/2020   CT Abdomen/Pelvis  1. Mild diffuse colonic wall thickening consistent with infectious or inflammatory colitis. 2. Diverticulosis without evidence of diverticulitis. 3.  Aortic Atherosclerosis (ICD10-I70.0).  Discharge Instructions: Discharge Instructions    Call MD for:  difficulty  breathing, headache or visual disturbances   Complete by: As directed    Call MD for:  extreme fatigue   Complete by: As directed    Call MD for:  persistant dizziness or light-headedness   Complete by: As directed    Call MD for:  persistant nausea and vomiting   Complete by: As directed    Call MD for:  severe uncontrolled pain   Complete by: As directed    Call MD for:  temperature >100.4   Complete by: As directed    Diet - low sodium heart healthy   Complete by: As directed    Increase activity slowly   Complete by: As directed       Signed: Dr. Jose Persia Internal Medicine PGY-1  Pager: [phone] 05/01/2020, 2:48 PM

## 2020-05-03 LAB — FOLATE RBC
Folate, Hemolysate: 214 ng/mL
Folate, RBC: 617 ng/mL (ref >498)
Hematocrit: 34.7 % (ref 34.0–46.6)

## 2020-05-05 ENCOUNTER — Other Ambulatory Visit: Payer: Self-pay

## 2020-05-05 ENCOUNTER — Ambulatory Visit (INDEPENDENT_AMBULATORY_CARE_PROVIDER_SITE_OTHER): Payer: Medicare HMO | Admitting: Internal Medicine

## 2020-05-05 VITALS — BP 158/90 | HR 72 | Temp 97.7°F | Ht 59.0 in | Wt 121.3 lb

## 2020-05-05 DIAGNOSIS — R112 Nausea with vomiting, unspecified: Secondary | ICD-10-CM | POA: Diagnosis not present

## 2020-05-05 DIAGNOSIS — E538 Deficiency of other specified B group vitamins: Secondary | ICD-10-CM | POA: Diagnosis not present

## 2020-05-05 DIAGNOSIS — D649 Anemia, unspecified: Secondary | ICD-10-CM

## 2020-05-05 DIAGNOSIS — R5383 Other fatigue: Secondary | ICD-10-CM

## 2020-05-05 DIAGNOSIS — K449 Diaphragmatic hernia without obstruction or gangrene: Secondary | ICD-10-CM

## 2020-05-05 MED ORDER — CYANOCOBALAMIN 1000 MCG/ML IJ SOLN
1000.0000 ug | Freq: Once | INTRAMUSCULAR | Status: AC
  Administered 2020-05-05: 1000 ug via INTRAMUSCULAR

## 2020-05-05 MED ORDER — DEXILANT 30 MG PO CPDR: 30.0000 mg | DELAYED_RELEASE_CAPSULE | ORAL | 3 refills | Status: DC

## 2020-05-05 NOTE — Patient Instructions (Addendum)
It was a pleasure to see you today Ms. Wilkinson. Please make the following changes:  Please pick up script for dexilant You received b12 supplementation at this visit  You have been referred to nutritionist Please follow up in 2 weeks   If you have any questions or concerns, please call our clinic at [phone] between 9am-5pm and after hours call [phone] and ask for the internal medicine resident on call. If you feel you are having a medical emergency please call 911.   Thank you, we look forward to help you remain healthy!  Lars Mage, MD Internal Medicine PGY3
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
## 2020-05-06 DIAGNOSIS — D649 Anemia, unspecified: Secondary | ICD-10-CM | POA: Insufficient documentation

## 2020-05-06 LAB — BMP8+ANION GAP
Anion Gap: 15 mmol/L (ref 10.0–18.0)
BUN/Creatinine Ratio: 26 (ref 12–28)
BUN: 21 mg/dL (ref 8–27)
CO2: 24 mmol/L (ref 20–29)
Calcium: 9.3 mg/dL (ref 8.7–10.3)
Chloride: 101 mmol/L (ref 96–106)
Creatinine, Ser: 0.81 mg/dL (ref 0.57–1.00)
GFR calc Af Amer: 82 mL/min/{1.73_m2} (ref >59)
GFR calc non Af Amer: 71 mL/min/{1.73_m2} (ref >59)
Glucose: 89 mg/dL (ref 65–99)
Potassium: 4.2 mmol/L (ref 3.5–5.2)
Sodium: 140 mmol/L (ref 134–144)

## 2020-05-06 NOTE — Assessment & Plan Note (Signed)
The patient is presenting for hospital follow-up.  She was hospitalized from 04/28/20-05/01/20 for intractable nausea and vomiting, colitis that was thought to be secondary to her gastroparesis and potential GI motility post gastrojejunostomy.  She was given supportive therapy with Reglan and IV pantoprazole. CT abd imaging showed a mild diffuse colonic wall thickening.  Patient was told to follow-up with [facility] GI outpatient.  During this encounter, the patient appears weak. She states that she ate a bagel with cream cheese this morning and nothing else all day. She has been able to keep down food and drink. She feels that the reglan has been helping her. She has not picked up her b12 medication and has been off of her dexilant.   Assessment and plan  I suspect that the patient's fatigue is secondary to her poor intake and b12 deficiency. The patient was given IM b12 injection 1058mcg during office visit. Will check bmp. Orthostatic vitals were within normal range. Patient to follow up with GI. Refer to chronic care management to assist with medication assistance.

## 2020-05-06 NOTE — Assessment & Plan Note (Signed)
Patient had chronic normocytic anemia during hospitalization that was thought to be multifactorial (B12 deficiency).  She is getting B12 supplementation.  Folate was within normal range.

## 2020-05-08 ENCOUNTER — Ambulatory Visit: Payer: Medicare HMO | Admitting: *Deleted

## 2020-05-08 DIAGNOSIS — I10 Essential (primary) hypertension: Secondary | ICD-10-CM

## 2020-05-08 DIAGNOSIS — E1143 Type 2 diabetes mellitus with diabetic autonomic (poly)neuropathy: Secondary | ICD-10-CM

## 2020-05-08 NOTE — Progress Notes (Signed)
Internal Medicine Clinic Attending  Case discussed with Dr. Chundi at the time of the visit.  We reviewed the resident's history and exam and pertinent patient test results.  I agree with the assessment, diagnosis, and plan of care documented in the resident's note. 

## 2020-05-08 NOTE — Chronic Care Management (AMB) (Signed)
  Chronic Care Management   Note  05/08/2020 Name: Colleen Medina MRN: [id number] DOB: [birth year]   Successful outreach to patient to follow up on Dr. Thea Gist request related to assistance with Dexilant copay and to ensure GI follow up. Patient states she has an appointment with Dr Clemencia Course in early June- will be seeing him for the first time because her previous gastroenterologist  Dr Oletta Lamas recently retired. She says that is the earliest appointment she could get and that the office is aware she just came out of the hospital.  She says she cannot afford the Dexilant copay of $145.79 so she did not pick up the Rx. She says she has severe GERD that often keeps her awake a at night. She says her blood pressure, blood sugar and asthma are currently in good control. She is able to identify her asthma triggers and says she will call the clinic for refills on her asthma medications when needed. She says she has a home BP monitor and monitor her blood pressure but does not check her blood sugar as she is able to control her diabetes with diet for many years.   Follow up plan: Patient advised part time clinic pharmacy technician Cheryle Horsfall has been notified of patient's inability to afford Dexilant copay and will be reaching gout to her. Also, suggested she ask if Dr Clemencia Course has samples of [place] when she sees him in June. Patient also advised to contact clinic for health related questions and/or concerns. No further chronic care management needs identified so no further  follow up indicated at this time.  Kelli Churn RN, CCM, [facility] RN Care Manager [phone]

## 2020-05-11 ENCOUNTER — Ambulatory Visit: Payer: Self-pay

## 2020-05-11 NOTE — Progress Notes (Signed)
Internal Medicine Clinic Resident  I have personally reviewed this encounter including the documentation in this note and/or discussed this patient with the care management provider. I will address any urgent items identified by the care management provider and will communicate my actions to the patient's PCP. I have reviewed the patient's CCM visit with my supervising attending, Dr Butcher.  Sadia Aslam, MD  Internal Medicine, PGY-1 05/11/2020    

## 2020-05-11 NOTE — Chronic Care Management (AMB) (Signed)
  Care Management   Social Work Note  05/11/2020 Name: BETTE SWATZELL MRN: [id number] DOB: 18-Jan-1941  KALIE WEINHEIMER is a 79 y.o. year old female who sees Sid Falcon, MD for primary care. The Care Management team was consulted for assistance with medication assistance  Following communication received from Pharmacy Technician, Cheryle Horsfall: "Contacted Ms. Kyser, verifying DOB and home address. I am mailing an application to her home address with instructions to return completed application to Internal Med."   Jamarkis Branam, [place] [phone]

## 2020-05-13 ENCOUNTER — Telehealth: Payer: Self-pay | Admitting: *Deleted

## 2020-05-13 NOTE — Telephone Encounter (Addendum)
Fax from [facility] requesting a change in [place] to Omeprazole due to cost.  Message to be sent to ordering physician Dr. Maricela Bo to see if patient can be changed.  Unable to contact  Sender from [facility] to discuss possible alternatives or cost to patient for the [place].  Sander Nephew, RN 05/13/2020 11:55 AM  Call to patient informed her of the cost for the Dexilant  145.79.  Suggestion to patient to call her GI doctor and see if the office will provide her with samples of Dexilant or a drug saving card.  Patient voiced understanding of and will call her doctor with [facility] for samples or medication assistance.  Sander Nephew, RN 05/19/2020 3:55 PM.

## 2020-05-13 NOTE — Telephone Encounter (Signed)
Dexilant was prescribed by GI. I would recommend discussing with them why it was specifically chosen

## 2020-05-15 DIAGNOSIS — Z1231 Encounter for screening mammogram for malignant neoplasm of breast: Secondary | ICD-10-CM | POA: Diagnosis not present

## 2020-05-15 NOTE — Telephone Encounter (Signed)
I agree.  Please have patient call them to see if they can get her samples or get her medication assistance.  Thanks!

## 2020-05-15 NOTE — Telephone Encounter (Signed)
Yes

## 2020-05-21 ENCOUNTER — Other Ambulatory Visit: Payer: Self-pay

## 2020-05-21 ENCOUNTER — Ambulatory Visit (INDEPENDENT_AMBULATORY_CARE_PROVIDER_SITE_OTHER): Payer: Medicare HMO | Admitting: Internal Medicine

## 2020-05-21 ENCOUNTER — Ambulatory Visit (HOSPITAL_COMMUNITY)
Admission: RE | Admit: 2020-05-21 | Discharge: 2020-05-21 | Disposition: A | Discharge status: Home / Self Care | Payer: Medicare HMO | Source: Ambulatory Visit | Attending: Internal Medicine | Admitting: Internal Medicine

## 2020-05-21 VITALS — BP 164/86 | HR 56 | Temp 98.0°F | Ht 59.0 in | Wt 123.3 lb

## 2020-05-21 DIAGNOSIS — G8929 Other chronic pain: Secondary | ICD-10-CM | POA: Diagnosis not present

## 2020-05-21 DIAGNOSIS — M25461 Effusion, right knee: Secondary | ICD-10-CM | POA: Diagnosis not present

## 2020-05-21 DIAGNOSIS — M25561 Pain in right knee: Secondary | ICD-10-CM | POA: Diagnosis not present

## 2020-05-21 DIAGNOSIS — M25519 Pain in unspecified shoulder: Secondary | ICD-10-CM

## 2020-05-21 DIAGNOSIS — Z9889 Other specified postprocedural states: Secondary | ICD-10-CM | POA: Diagnosis not present

## 2020-05-21 DIAGNOSIS — M25512 Pain in left shoulder: Secondary | ICD-10-CM | POA: Diagnosis not present

## 2020-05-21 LAB — SYNOVIAL CELL COUNT + DIFF, W/ CRYSTALS
Crystals, Fluid: NONE SEEN
Eosinophils-Synovial: 0 % (ref 0–1)
Lymphocytes-Synovial Fld: 40 % — ABNORMAL HIGH (ref 0–20)
Monocyte-Macrophage-Synovial Fluid: 59 % (ref 50–90)
Neutrophil, Synovial: 1 % (ref 0–25)
WBC, Synovial: 103 /mm3 (ref 0–200)

## 2020-05-21 MED ORDER — MELOXICAM 7.5 MG PO TABS: 7.5000 mg | ORAL_TABLET | Freq: Every day | ORAL | 1 refills | Status: DC

## 2020-05-21 NOTE — Patient Instructions (Addendum)
Thank you for visiting [place] in clinic today.  Below is a summary of what we discussed:  1.  Shoulder pain and knee effusion  -We performed an arthrocentesis to take the fluid out of your knee.  We will send the fluid to the lab to run some tests on it. -We also ordered an x-ray of your L shoulder and R knee as well to get a better picture of what is going on inside of it. You may get this done today. -We gave you a prescription of Mobic.  Take 1 or 2 pills once a day with food to help manage your pain.  2.  Follow-up -Please schedule a follow-up with [place] in 2 weeks  If you have any questions or concerns, please feel free to reach out to [place].

## 2020-05-21 NOTE — Progress Notes (Signed)
After consent was obtained, using sterile technique the right knee was prepped and plain Lidocaine 1% was used as local anesthetic. The joint was entered and 26 ml's of yellow colored fluid was withdrawn and sent for gram stain, glucose, cell count, culture.  Steroid was not injected.  The procedure was well tolerated.  The patient is asked to continue to rest the joint for a few more days before resuming regular activities.  It may be more painful for the first 1-2 days.  Watch for fever, or increased swelling or persistent pain in the joint. Call or return to clinic prn if such symptoms occur or there is failure to improve as anticipated.  Kathi Ludwig, MD [facility] Internal Medicine, PGY-3
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
## 2020-05-22 DIAGNOSIS — M25461 Effusion, right knee: Secondary | ICD-10-CM | POA: Insufficient documentation

## 2020-05-22 LAB — GLUCOSE, BODY FLUID OTHER: Glucose, Body Fluid Other: 109 mg/dL

## 2020-05-22 NOTE — Assessment & Plan Note (Signed)
Patient states that she has a history of rotator cuff injury that required surgery.  Her left shoulder is had pain since her fall in February 2021.  Patient states that she has decreased mobility in her shoulder and cannot lift it.  Pain is 10 out of 10 in severity.  On exam, the patient is unable to abduct her shoulder past 25 degrees due to pain.  She also cannot reach behind her back or participate in the open can test.  Passive motion produces pain as well.  I suspect the patient may have another rotator cuff injury in addition to joint pathology.  Plan -X-ray L shoulder -Consider shoulder MRI pending results of x-ray -Meloxicam 7.5 mg - 15 mg once a day with food

## 2020-05-22 NOTE — Assessment & Plan Note (Signed)
The patient states she has been having right knee pain and swelling since she sustained a fall in February 2021.  At the time, the patient tripped over a toy her grandchild placed on the ground and fell face down.  Her knee is constantly in pain and is 10 out of 10 in severity.  She has tried elevating her knee as well as taking Tylenol without much relief.  She is able to ambulate, but has to walk with a limp and has a slow pace.  Patient also describes a locking sensation particularly when she ambulates or goes upstairs.  She typically feels pain mostly in the medial aspect of her knee.  She is never had it imaged before. Denies having any fevers.  On exam, her right knee it is obvious the patient has swelling, but there is no erythema.  It is not warm to touch but is tender to palpation along the joint line.  McMurray maneuver positive on the medial aspect of the knee.  I suspect the patient likely has a traumatic effusion, with high suspicion of medial meniscal tear.  Plan: -Aspirate joint with cell count with differential and crystal, Gram stain, culture -We will hold off on steroid injection until we can confirm is not a septic joint, although my suspicion is very low -Knee x-ray, will likely need MRI in the future especially if pt continues to have locking sensation in her knee. -Meloxicam 7.5-50 mg once a day (30 tablets), patient encouraged to take with food -Follow-up in 2 weeks

## 2020-05-24 LAB — BODY FLUID CULTURE: Culture: NO GROWTH

## 2020-05-27 ENCOUNTER — Other Ambulatory Visit: Payer: Self-pay | Admitting: Physician Assistant

## 2020-05-27 DIAGNOSIS — D649 Anemia, unspecified: Secondary | ICD-10-CM | POA: Diagnosis not present

## 2020-05-27 DIAGNOSIS — R112 Nausea with vomiting, unspecified: Secondary | ICD-10-CM | POA: Diagnosis not present

## 2020-05-27 DIAGNOSIS — K219 Gastro-esophageal reflux disease without esophagitis: Secondary | ICD-10-CM | POA: Diagnosis not present

## 2020-05-27 DIAGNOSIS — Z8719 Personal history of other diseases of the digestive system: Secondary | ICD-10-CM | POA: Diagnosis not present

## 2020-05-27 DIAGNOSIS — R933 Abnormal findings on diagnostic imaging of other parts of digestive tract: Secondary | ICD-10-CM | POA: Diagnosis not present

## 2020-05-27 DIAGNOSIS — R1310 Dysphagia, unspecified: Secondary | ICD-10-CM

## 2020-05-27 DIAGNOSIS — K3184 Gastroparesis: Secondary | ICD-10-CM | POA: Diagnosis not present

## 2020-05-29 ENCOUNTER — Telehealth: Payer: Self-pay | Admitting: *Deleted

## 2020-05-29 NOTE — Telephone Encounter (Signed)
Lisinopril and rosuvastatin transferred to [place]

## 2020-06-01 ENCOUNTER — Other Ambulatory Visit: Payer: Self-pay

## 2020-06-01 ENCOUNTER — Ambulatory Visit
Admission: RE | Admit: 2020-06-01 | Discharge: 2020-06-01 | Disposition: A | Discharge status: Home / Self Care | Payer: Medicare HMO | Source: Ambulatory Visit | Attending: Physician Assistant | Admitting: Physician Assistant

## 2020-06-01 DIAGNOSIS — R1310 Dysphagia, unspecified: Secondary | ICD-10-CM | POA: Diagnosis not present

## 2020-06-03 ENCOUNTER — Encounter: Payer: Self-pay | Admitting: Internal Medicine

## 2020-06-03 ENCOUNTER — Other Ambulatory Visit: Payer: Self-pay

## 2020-06-03 ENCOUNTER — Ambulatory Visit (INDEPENDENT_AMBULATORY_CARE_PROVIDER_SITE_OTHER): Payer: Medicare HMO | Admitting: Internal Medicine

## 2020-06-03 VITALS — BP 172/73 | HR 54 | Temp 98.0°F | Ht 59.0 in | Wt 126.5 lb

## 2020-06-03 DIAGNOSIS — K449 Diaphragmatic hernia without obstruction or gangrene: Secondary | ICD-10-CM | POA: Diagnosis not present

## 2020-06-03 DIAGNOSIS — I10 Essential (primary) hypertension: Secondary | ICD-10-CM

## 2020-06-03 DIAGNOSIS — F325 Major depressive disorder, single episode, in full remission: Secondary | ICD-10-CM

## 2020-06-03 DIAGNOSIS — Z23 Encounter for immunization: Secondary | ICD-10-CM

## 2020-06-03 DIAGNOSIS — I872 Venous insufficiency (chronic) (peripheral): Secondary | ICD-10-CM | POA: Diagnosis not present

## 2020-06-03 DIAGNOSIS — D1330 Benign neoplasm of unspecified part of small intestine: Secondary | ICD-10-CM | POA: Diagnosis not present

## 2020-06-03 DIAGNOSIS — M159 Polyosteoarthritis, unspecified: Secondary | ICD-10-CM

## 2020-06-03 DIAGNOSIS — E1143 Type 2 diabetes mellitus with diabetic autonomic (poly)neuropathy: Secondary | ICD-10-CM | POA: Diagnosis not present

## 2020-06-03 DIAGNOSIS — M8949 Other hypertrophic osteoarthropathy, multiple sites: Secondary | ICD-10-CM | POA: Diagnosis not present

## 2020-06-03 DIAGNOSIS — K219 Gastro-esophageal reflux disease without esophagitis: Secondary | ICD-10-CM | POA: Diagnosis not present

## 2020-06-03 DIAGNOSIS — I700 Atherosclerosis of aorta: Secondary | ICD-10-CM

## 2020-06-03 LAB — PROTEIN ELECTRO, BODY FLUID
Albumin: 2.2 g/dL
Alpha-1 Globulin: 0.1 g/dL
Alpha-2 Globulin: 0.1 g/dL
Beta Globulin: 0.4 g/dL
Gamma Globulin: 0.4 g/dL
Protein, Total: 3.2 g/dL

## 2020-06-03 NOTE — Patient Instructions (Signed)
Colleen Medina - -  It was a pleasure to see you.    You have shoulder pain related to chronic arthritis and changes in your shoulder.  Below are some exercises that may help.  Continue your current therapy.  You also have chronic osteoarthritis of the knee.  You may benefit from seeing a Psychologist, sport and exercise.  Please call your orthopedic surgeon and get an appointment.   For your blood pressure, please try to check your blood pressure at home to make sure that we are treating you appropriately for your pressure with lisinopril.    For your lower leg swelling, please read information on chronic venous insufficiency noted below.    Continue your medications as you have been taking them.  Follow up as planned with Dr. Michail Sermon.     Shoulder Exercises Ask your health care provider which exercises are safe for you. Do exercises exactly as told by your health care provider and adjust them as directed. It is normal to feel mild stretching, pulling, tightness, or discomfort as you do these exercises. Stop right away if you feel sudden pain or your pain gets worse. Do not begin these exercises until told by your health care provider. Stretching exercises External rotation and abduction This exercise is sometimes called corner stretch. This exercise rotates your arm outward (external rotation) and moves your arm out from your body (abduction). 1. Stand in a doorway with one of your feet slightly in front of the other. This is called a staggered stance. If you cannot reach your forearms to the door frame, stand facing a corner of a room. 2. Choose one of the following positions as told by your health care provider: ? Place your hands and forearms on the door frame above your head. ? Place your hands and forearms on the door frame at the height of your head. ? Place your hands on the door frame at the height of your elbows. 3. Slowly move your weight onto your front foot until you feel a stretch across your chest and  in the front of your shoulders. Keep your head and chest upright and keep your abdominal muscles tight. 4. Hold for __________ seconds. 5. To release the stretch, shift your weight to your back foot. Repeat __________ times. Complete this exercise __________ times a day. Extension, standing 1. Stand and hold a broomstick, a cane, or a similar object behind your back. ? Your hands should be a little wider than shoulder width apart. ? Your palms should face away from your back. 2. Keeping your elbows straight and your shoulder muscles relaxed, move the stick away from your body until you feel a stretch in your shoulders (extension). ? Avoid shrugging your shoulders while you move the stick. Keep your shoulder blades tucked down toward the middle of your back. 3. Hold for __________ seconds. 4. Slowly return to the starting position. Repeat __________ times. Complete this exercise __________ times a day. Range-of-motion exercises Pendulum  1. Stand near a wall or a surface that you can hold onto for balance. 2. Bend at the waist and let your left / right arm hang straight down. Use your other arm to support you. Keep your back straight and do not lock your knees. 3. Relax your left / right arm and shoulder muscles, and move your hips and your trunk so your left / right arm swings freely. Your arm should swing because of the motion of your body, not because you are using your arm or  shoulder muscles. 4. Keep moving your hips and trunk so your arm swings in the following directions, as told by your health care provider: ? Side to side. ? Forward and backward. ? In clockwise and counterclockwise circles. 5. Continue each motion for __________ seconds, or for as long as told by your health care provider. 6. Slowly return to the starting position. Repeat __________ times. Complete this exercise __________ times a day. Shoulder flexion, standing  1. Stand and hold a broomstick, a cane, or a  similar object. Place your hands a little more than shoulder width apart on the object. Your left / right hand should be palm up, and your other hand should be palm down. 2. Keep your elbow straight and your shoulder muscles relaxed. Push the stick up with your healthy arm to raise your left / right arm in front of your body, and then over your head until you feel a stretch in your shoulder (flexion). ? Avoid shrugging your shoulder while you raise your arm. Keep your shoulder blade tucked down toward the middle of your back. 3. Hold for __________ seconds. 4. Slowly return to the starting position. Repeat __________ times. Complete this exercise __________ times a day. Shoulder abduction, standing 1. Stand and hold a broomstick, a cane, or a similar object. Place your hands a little more than shoulder width apart on the object. Your left / right hand should be palm up, and your other hand should be palm down. 2. Keep your elbow straight and your shoulder muscles relaxed. Push the object across your body toward your left / right side. Raise your left / right arm to the side of your body (abduction) until you feel a stretch in your shoulder. ? Do not raise your arm above shoulder height unless your health care provider tells you to do that. ? If directed, raise your arm over your head. ? Avoid shrugging your shoulder while you raise your arm. Keep your shoulder blade tucked down toward the middle of your back. 3. Hold for __________ seconds. 4. Slowly return to the starting position. Repeat __________ times. Complete this exercise __________ times a day. Internal rotation  1. Place your left / right hand behind your back, palm up. 2. Use your other hand to dangle an exercise band, a towel, or a similar object over your shoulder. Grasp the band with your left / right hand so you are holding on to both ends. 3. Gently pull up on the band until you feel a stretch in the front of your left / right  shoulder. The movement of your arm toward the center of your body is called internal rotation. ? Avoid shrugging your shoulder while you raise your arm. Keep your shoulder blade tucked down toward the middle of your back. 4. Hold for __________ seconds. 5. Release the stretch by letting go of the band and lowering your hands. Repeat __________ times. Complete this exercise __________ times a day. Strengthening exercises External rotation  1. Sit in a stable chair without armrests. 2. Secure an exercise band to a stable object at elbow height on your left / right side. 3. Place a soft object, such as a folded towel or a small pillow, between your left / right upper arm and your body to move your elbow about 4 inches (10 cm) away from your side. 4. Hold the end of the exercise band so it is tight and there is no slack. 5. Keeping your elbow pressed against the soft  object, slowly move your forearm out, away from your abdomen (external rotation). Keep your body steady so only your forearm moves. 6. Hold for __________ seconds. 7. Slowly return to the starting position. Repeat __________ times. Complete this exercise __________ times a day. Shoulder abduction  1. Sit in a stable chair without armrests, or stand up. 2. Hold a __________ weight in your left / right hand, or hold an exercise band with both hands. 3. Start with your arms straight down and your left / right palm facing in, toward your body. 4. Slowly lift your left / right hand out to your side (abduction). Do not lift your hand above shoulder height unless your health care provider tells you that this is safe. ? Keep your arms straight. ? Avoid shrugging your shoulder while you do this movement. Keep your shoulder blade tucked down toward the middle of your back. 5. Hold for __________ seconds. 6. Slowly lower your arm, and return to the starting position. Repeat __________ times. Complete this exercise __________ times a  day. Shoulder extension 1. Sit in a stable chair without armrests, or stand up. 2. Secure an exercise band to a stable object in front of you so it is at shoulder height. 3. Hold one end of the exercise band in each hand. Your palms should face each other. 4. Straighten your elbows and lift your hands up to shoulder height. 5. Step back, away from the secured end of the exercise band, until the band is tight and there is no slack. 6. Squeeze your shoulder blades together as you pull your hands down to the sides of your thighs (extension). Stop when your hands are straight down by your sides. Do not let your hands go behind your body. 7. Hold for __________ seconds. 8. Slowly return to the starting position. Repeat __________ times. Complete this exercise __________ times a day. Shoulder row 1. Sit in a stable chair without armrests, or stand up. 2. Secure an exercise band to a stable object in front of you so it is at waist height. 3. Hold one end of the exercise band in each hand. Position your palms so that your thumbs are facing the ceiling (neutral position). 4. Bend each of your elbows to a 90-degree angle (right angle) and keep your upper arms at your sides. 5. Step back until the band is tight and there is no slack. 6. Slowly pull your elbows back behind you. 7. Hold for __________ seconds. 8. Slowly return to the starting position. Repeat __________ times. Complete this exercise __________ times a day. Shoulder press-ups  1. Sit in a stable chair that has armrests. Sit upright, with your feet flat on the floor. 2. Put your hands on the armrests so your elbows are bent and your fingers are pointing forward. Your hands should be about even with the sides of your body. 3. Push down on the armrests and use your arms to lift yourself off the chair. Straighten your elbows and lift yourself up as much as you comfortably can. ? Move your shoulder blades down, and avoid letting your  shoulders move up toward your ears. ? Keep your feet on the ground. As you get stronger, your feet should support less of your body weight as you lift yourself up. 4. Hold for __________ seconds. 5. Slowly lower yourself back into the chair. Repeat __________ times. Complete this exercise __________ times a day. Wall push-ups  1. Stand so you are facing a stable wall. Your  feet should be about one arm-length away from the wall. 2. Lean forward and place your palms on the wall at shoulder height. 3. Keep your feet flat on the floor as you bend your elbows and lean forward toward the wall. 4. Hold for __________ seconds. 5. Straighten your elbows to push yourself back to the starting position. Repeat __________ times. Complete this exercise __________ times a day. This information is not intended to replace advice given to you by your health care provider. Make sure you discuss any questions you have with your health care provider. Document Revised: 03/29/2019 Document Reviewed: 01/04/2019 Elsevier Patient Education  Colleen Medina.   Chronic Venous Insufficiency Chronic venous insufficiency is a condition where the leg veins cannot effectively pump blood from the legs to the heart. This happens when the vein walls are either stretched, weakened, or damaged, or when the valves inside the vein are damaged. With the right treatment, you should be able to continue with an active life. This condition is also called venous stasis. What are the causes? Common causes of this condition include:  High blood pressure inside the veins (venous hypertension).  Sitting or standing too long, causing increased blood pressure in the leg veins.  A blood clot that blocks blood flow in a vein (deep vein thrombosis, DVT).  Inflammation of a vein (phlebitis) that causes a blood clot to form.  Tumors in the pelvis that cause blood to back up. What increases the risk? The following factors may make you  more likely to develop this condition:  Having a family history of this condition.  Obesity.  Pregnancy.  Living without enough regular physical activity or exercise (sedentary lifestyle).  Smoking.  Having a job that requires long periods of standing or sitting in one place.  Being a certain age. Women in their 56s and 42s and men in their 68s are more likely to develop this condition. What are the signs or symptoms? Symptoms of this condition include:  Veins that are enlarged, bulging, or twisted (varicose veins).  Skin breakdown or ulcers.  Reddened skin or dark discoloration of skin on the leg between the knee and ankle.  Brown, smooth, tight, and painful skin just above the ankle, usually on the inside of the leg (lipodermatosclerosis).  Swelling of the legs. How is this diagnosed? This condition may be diagnosed based on:  Your medical history.  A physical exam.  Tests, such as: ? A procedure that creates an image of a blood vessel and nearby organs and provides information about blood flow through the blood vessel (duplex ultrasound). ? A procedure that tests blood flow (plethysmography). ? A procedure that looks at the veins using X-ray and dye (venogram). How is this treated? The goals of treatment are to help you return to an active life and to minimize pain or disability. Treatment depends on the severity of your condition, and it may include:  Wearing compression stockings. These can help relieve symptoms and help prevent your condition from getting worse. However, they do not cure the condition.  Sclerotherapy. This procedure involves an injection of a solution that shrinks damaged veins.  Surgery. This may involve: ? Removing a diseased vein (vein stripping). ? Cutting off blood flow through the vein (laser ablation surgery). ? Repairing or reconstructing a valve within the affected vein. Follow these instructions at home:      Wear compression  stockings as told by your health care provider. These stockings help to prevent blood clots  and reduce swelling in your legs.  Take over-the-counter and prescription medicines only as told by your health care provider.  Stay active by exercising, walking, or doing different activities. Ask your health care provider what activities are safe for you and how much exercise you need.  Drink enough fluid to keep your urine pale yellow.  Do not use any products that contain nicotine or tobacco, such as cigarettes, e-cigarettes, and chewing tobacco. If you need help quitting, ask your health care provider.  Keep all follow-up visits as told by your health care provider. This is important. Contact a health care provider if you:  Have redness, swelling, or more pain in the affected area.  See a red streak or line that goes up or down from the affected area.  Have skin breakdown or skin loss in the affected area, even if the breakdown is small.  Get an injury in the affected area. Get help right away if:  You get an injury and an open wound in the affected area.  You have: ? Severe pain that does not get better with medicine. ? Sudden numbness or weakness in the foot or ankle below the affected area. ? Trouble moving your foot or ankle. ? A fever. ? Worse or persistent symptoms. ? Chest pain. ? Shortness of breath. Summary  Chronic venous insufficiency is a condition where the leg veins cannot effectively pump blood from the legs to the heart.  Chronic venous insufficiency occurs when the vein walls become stretched, weakened, or damaged, or when valves within the vein are damaged.  Treatment depends on how severe your condition is. It often involves wearing compression stockings and may involve having a procedure.  Please keep your legs elevated when you are seated or laying down  Make sure you stay active by exercising, walking, or doing different activities. Ask your health care  provider what activities are safe for you and how much exercise you need. This information is not intended to replace advice given to you by your health care provider. Make sure you discuss any questions you have with your health care provider. Document Revised: 08/28/2018 Document Reviewed: 08/28/2018 Elsevier Patient Education  Colleen Medina.

## 2020-06-03 NOTE — Progress Notes (Signed)
   Subjective:    Patient ID: Colleen Medina, female    DOB: [birth year], 79 y.o.   MRN: [id number]  CC: Follow up for chronic OA and HTN.   HPI  Colleen Medina is a 79 year old woman with PMH of DM2, HLD, anemia, chronic arthritis, HTN, osteopenia.    Colleen Medina reports shoulder pain and knee pain.  She has elevated blood pressure today, but notes that she is taking her lisinopril.  We reviewed xrays from earlier this month and blood work from last month.  She is willing to have the tetanus booster today and screen for HCV.  She is due to get her colonoscopy in July with Dr. Michail Sermon.   Review of Systems  Constitutional: Negative for activity change, appetite change, fatigue and fever.  Respiratory: Negative for cough, choking and shortness of breath.   Cardiovascular: Positive for leg swelling (noted for 1 month, seems to get worse with prolonged standing, + varicose veins). Negative for chest pain and palpitations.  Gastrointestinal: Positive for nausea (chronic). Negative for abdominal pain, constipation, diarrhea and vomiting.  Musculoskeletal: Positive for arthralgias (shoulder left and knee right), gait problem (knee pain) and joint swelling (knee).  Skin: Negative for color change and pallor.  Psychiatric/Behavioral: Negative for decreased concentration and dysphoric mood (PHQ 9 of 4).       Objective:   Physical Exam Vitals and nursing note reviewed.  Constitutional:      General: She is not in acute distress.    Appearance: Normal appearance. She is not ill-appearing.  HENT:     Head: Normocephalic and atraumatic.  Eyes:     General:        Right eye: No discharge.        Left eye: No discharge.     Conjunctiva/sclera: Conjunctivae normal.  Cardiovascular:     Rate and Rhythm: Normal rate and regular rhythm.     Heart sounds: No murmur heard.  No gallop.   Pulmonary:     Effort: Pulmonary effort is normal. No respiratory distress.     Breath sounds: Normal breath  sounds. No wheezing or rales.  Abdominal:     General: Abdomen is flat. Bowel sounds are normal.     Palpations: Abdomen is soft.  Musculoskeletal:        General: Swelling (right knee with ballotment of the patella) and tenderness (right knee, left shoulder) present. No signs of injury.     Right lower leg: Edema (pitting to mid calf) present.     Left lower leg: Edema (pitting to mid calf) present.  Skin:    General: Skin is warm and dry.  Neurological:     General: No focal deficit present.     Mental Status: She is alert. Mental status is at baseline.  Psychiatric:        Mood and Affect: Mood normal.        Behavior: Behavior normal.     HCV and Lipid panel      Assessment & Plan:  Return in 3 months (September)

## 2020-06-04 ENCOUNTER — Telehealth: Payer: Self-pay | Admitting: Internal Medicine

## 2020-06-04 LAB — LIPID PANEL
Chol/HDL Ratio: 2.2 ratio (ref 0.0–4.4)
Cholesterol, Total: 135 mg/dL (ref 100–199)
HDL: 62 mg/dL (ref >39)
LDL Chol Calc (NIH): 59 mg/dL (ref 0–99)
Triglycerides: 67 mg/dL (ref 0–149)
VLDL Cholesterol Cal: 14 mg/dL (ref 5–40)

## 2020-06-04 LAB — HEPATITIS C ANTIBODY: Hep C Virus Ab: <0.1 s/co ratio (ref 0.0–0.9)

## 2020-06-04 NOTE — Telephone Encounter (Signed)
I called Ms. Naill to update her on the results of her knee aspiration that was performed in clinic a couple weeks ago.  Patient's identity was confirmed via birth date. the fluid analysis did not show any signs of gout or infection.  Unfortunately, the patient has not experience much relief after the aspiration.  She states that the pain did not change much, and the swelling in her knee returned the day after the procedure.  We discussed potentially performing the procedure again but injecting her knee with steroids to help with inflammation and pain.  Patient was agreeable to this and is thinking about setting up an appointment to have this done.  All concerns and questions were answered.  Earlene Plater, MD Internal Medicine, PGY1 Pager: [phone]  06/04/2020,9:49 AM

## 2020-06-10 NOTE — Assessment & Plan Note (Signed)
She reports taking her medication (lisinopril) but she has very elevated blood pressure this morning.  I am going to get her to take her BP at home and see if she is more controlled outside of this setting.    Plan Continue lisinopril BP log at home Will likely need to add another medication at next visit if she does not have improvement.

## 2020-06-10 NOTE — Assessment & Plan Note (Signed)
Seen on CT scan.  Currently asymptomatic.  She has no chest pain or other symptoms today.

## 2020-06-10 NOTE — Assessment & Plan Note (Signed)
She cannot afford dexilant so is now taking omeprazole without issue.   She will follow up with GI in July.

## 2020-06-10 NOTE — Assessment & Plan Note (Signed)
She has chronic swelling that gets better with rest.  She does not note any changes to her diet.  She has chronic varicosities which she feels are getting bigger.  They are not painful.   Continue to advise resting with feet propped and consider compression stockings.  Will need ABIs prior to this.

## 2020-06-10 NOTE — Assessment & Plan Note (Signed)
She is due for colonoscopy in July of this year with Dr. Michail Sermon.

## 2020-06-10 NOTE — Assessment & Plan Note (Signed)
A1C is 7.0.  Her LDL is elevated.  She is on crestor.  Her BP is elevated.  See HTN issue.  She had her eye exam in Jan or Feb with Dr. Einar Gip.  Will attempt to get records.  Foot exam done today.   Plan Check lipid panel Will need higher dose of crestor if lipids remain high BMET for renal function at next visit, she is on lisinopril.

## 2020-06-13 NOTE — Assessment & Plan Note (Signed)
We discussed today and spoke about exercises to help the knee and shoulder.  She has done PT.  She is on mobic.  She has voltaren gel.  I advised her to follow back up with her orthopedist for evaluation for surgical intervention which she plans to do.

## 2020-06-18 ENCOUNTER — Encounter: Payer: Self-pay | Admitting: Internal Medicine

## 2020-07-03 DIAGNOSIS — R1310 Dysphagia, unspecified: Secondary | ICD-10-CM | POA: Diagnosis not present

## 2020-07-03 DIAGNOSIS — D509 Iron deficiency anemia, unspecified: Secondary | ICD-10-CM | POA: Diagnosis not present

## 2020-07-03 DIAGNOSIS — K219 Gastro-esophageal reflux disease without esophagitis: Secondary | ICD-10-CM | POA: Diagnosis not present

## 2020-07-03 DIAGNOSIS — K640 First degree hemorrhoids: Secondary | ICD-10-CM | POA: Diagnosis not present

## 2020-07-03 DIAGNOSIS — D122 Benign neoplasm of ascending colon: Secondary | ICD-10-CM | POA: Diagnosis not present

## 2020-07-03 DIAGNOSIS — R12 Heartburn: Secondary | ICD-10-CM | POA: Diagnosis not present

## 2020-07-03 DIAGNOSIS — D120 Benign neoplasm of cecum: Secondary | ICD-10-CM | POA: Diagnosis not present

## 2020-07-03 DIAGNOSIS — Z980 Intestinal bypass and anastomosis status: Secondary | ICD-10-CM | POA: Diagnosis not present

## 2020-07-03 DIAGNOSIS — R933 Abnormal findings on diagnostic imaging of other parts of digestive tract: Secondary | ICD-10-CM | POA: Diagnosis not present

## 2020-07-08 DIAGNOSIS — D122 Benign neoplasm of ascending colon: Secondary | ICD-10-CM | POA: Diagnosis not present

## 2020-07-08 DIAGNOSIS — D120 Benign neoplasm of cecum: Secondary | ICD-10-CM | POA: Diagnosis not present

## 2020-08-13 DIAGNOSIS — K219 Gastro-esophageal reflux disease without esophagitis: Secondary | ICD-10-CM | POA: Diagnosis not present

## 2020-08-13 DIAGNOSIS — K5900 Constipation, unspecified: Secondary | ICD-10-CM | POA: Diagnosis not present

## 2020-08-13 DIAGNOSIS — R159 Full incontinence of feces: Secondary | ICD-10-CM | POA: Diagnosis not present

## 2020-10-27 IMAGING — RF DG ESOPHAGUS
8 series · 14 of 24 positions shown · non-contrast
Comparison: 07/02/2018

CLINICAL DATA: Food getting stuck in throat.

EXAM:
ESOPHOGRAM / BARIUM SWALLOW / BARIUM TABLET STUDY
TECHNIQUE: Combined double contrast and single contrast examination performed
using effervescent crystals, thick barium liquid, and thin barium
liquid. The patient was observed with fluoroscopy swallowing a 13 mm
barium sulphate tablet.
FLUOROSCOPY TIME:  Fluoroscopy Time:  1 minutes and 18 seconds.
Radiation Exposure Index (if provided by the fluoroscopic device):
85 mGy
Number of Acquired Spot Images:

[Series 1: sequence · 0.28mm/px · 2 acquisitions, 2 frames shown (1 of 7)]
[im 1/2]
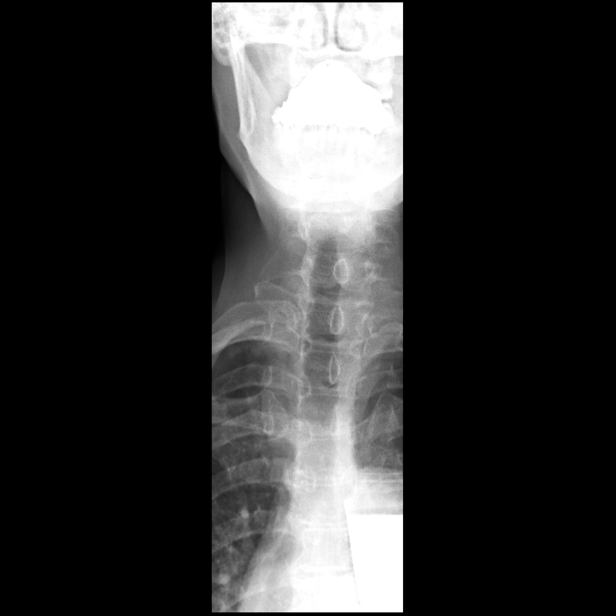
[im 2/2]
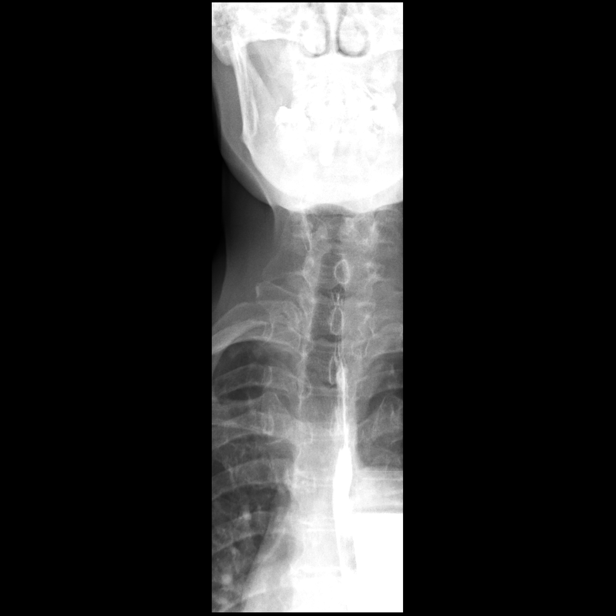

[Series 2: sequence · 0.28mm/px · 2 acquisitions, 1 frame shown (2 of 7)]
[im 2/2]
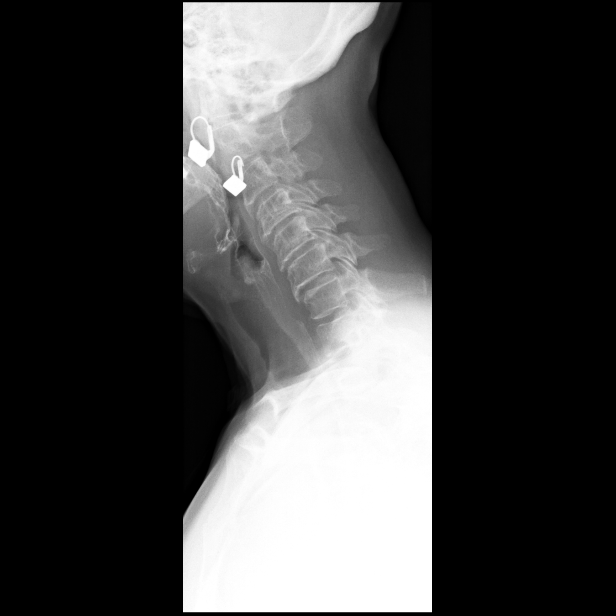

[Series 3: sequence · 0.28mm/px · 2 acquisitions, 2 frames shown (3 of 7)]
[im 1/2]
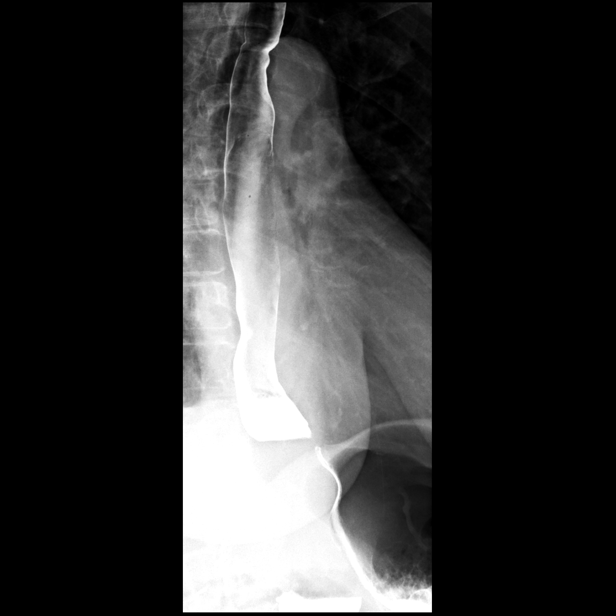
[im 2/2]
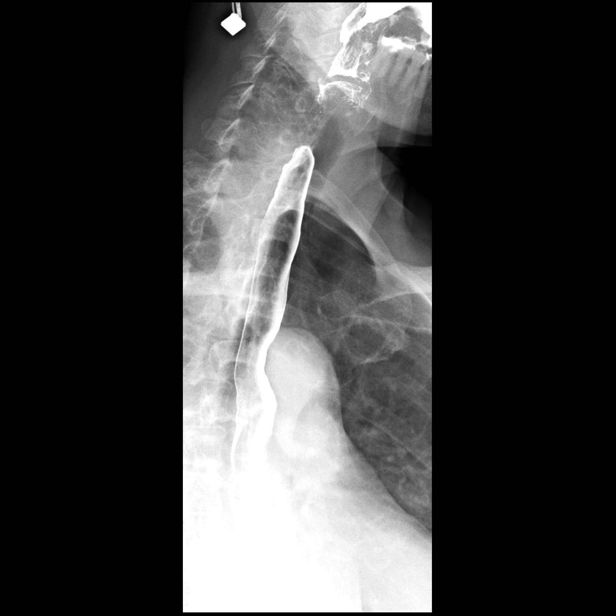

[Series 4: sequence · 0.28mm/px · 2 acquisitions, 3 frames shown (4 of 7)]
[im 1/2]
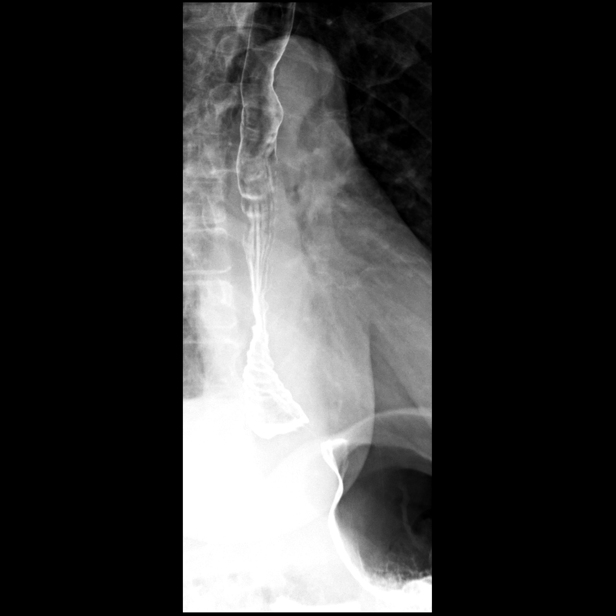
[im 2/2]
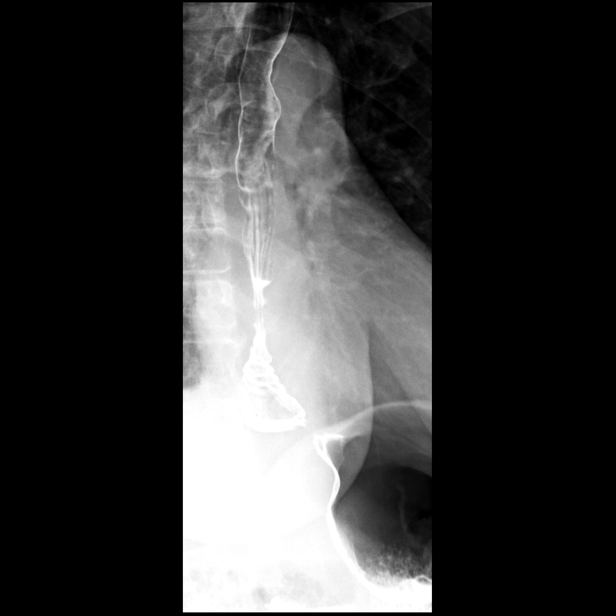
[im 2/2]
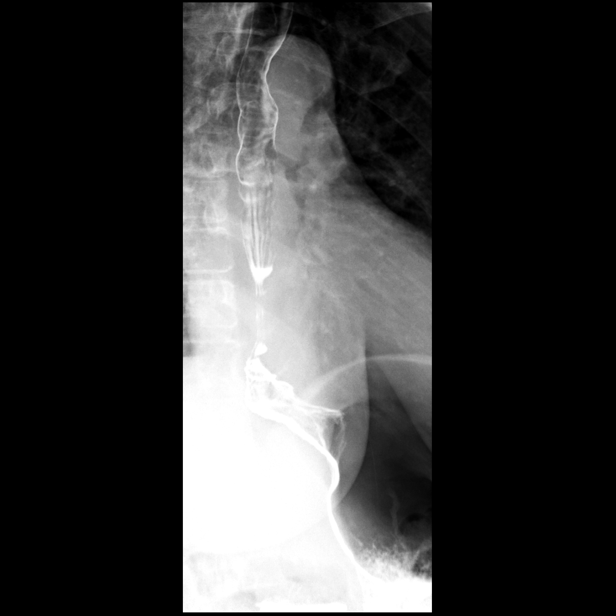

[Series 5: sequence · 2 acquisitions, 1 frame shown (5 of 7)]
[im 2/2]
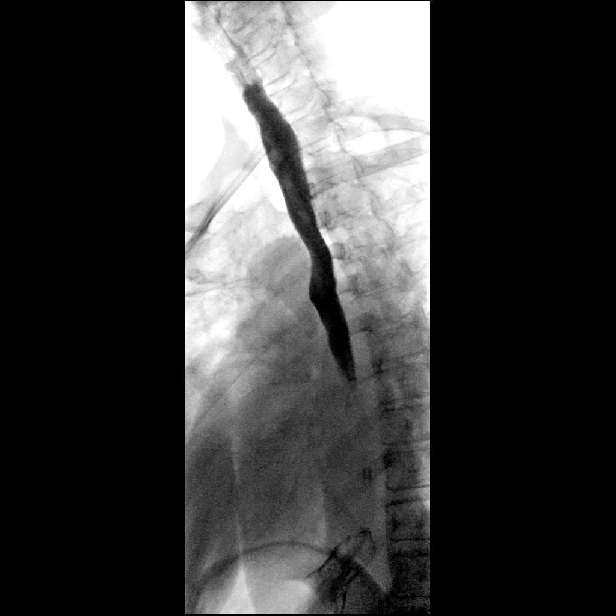

[Series 6: sequence · 2 acquisitions, 3 frames shown (6 of 7)]
[im 1/2]
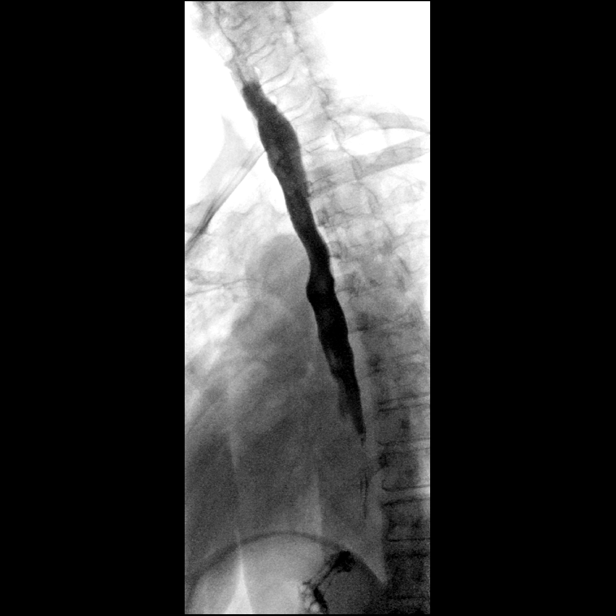
[im 2/2]
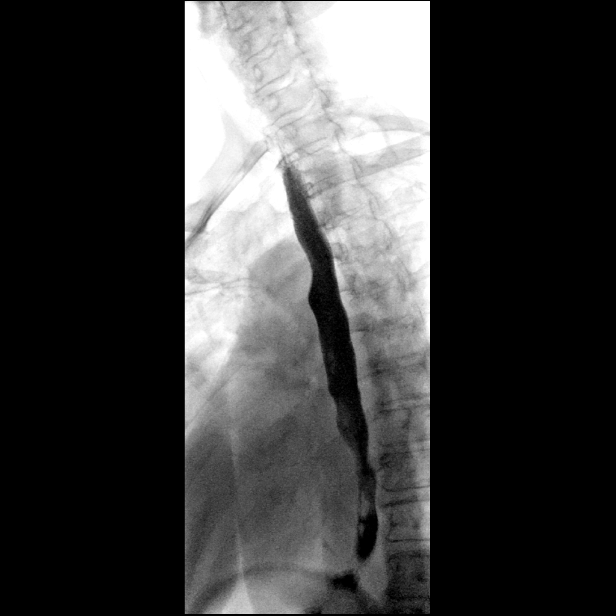
[im 2/2]
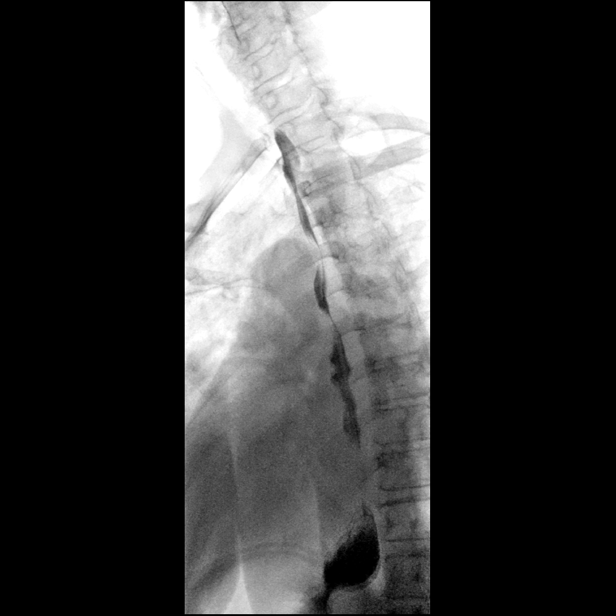

[Series 7: sequence · 2 acquisitions, 1 frame shown (7 of 7)]
[im 1/2]
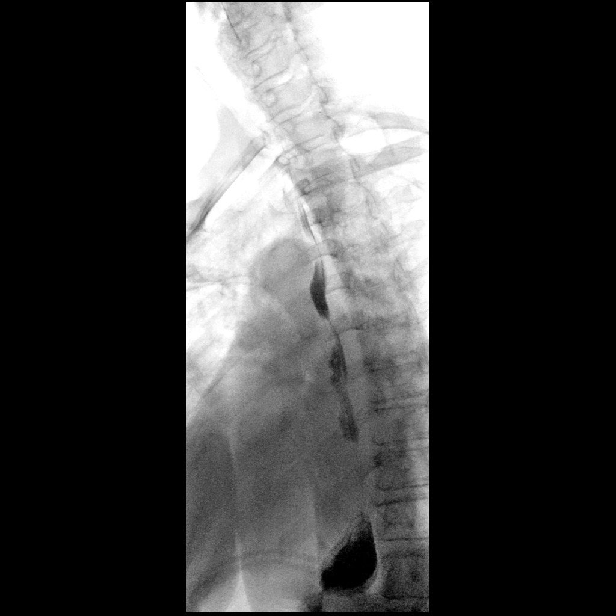

[Series 8: one shot · 1 of 1 slices shown]
[im 1/1]
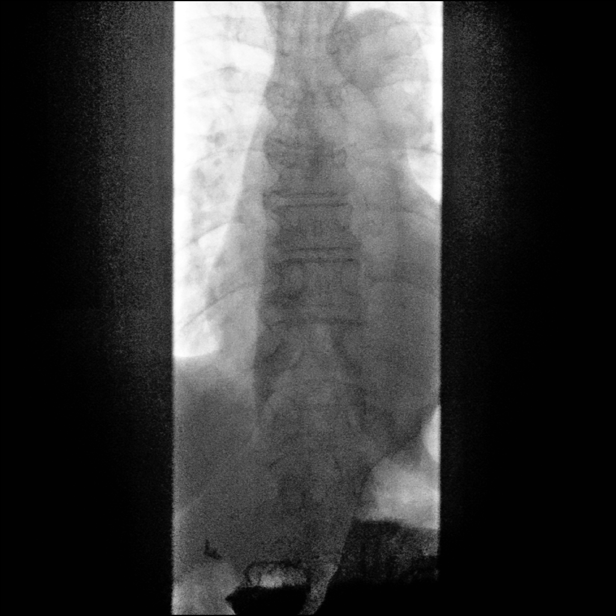

[14 of 24 positions shown; findings below may reference images not displayed]

FINDINGS: Frontal and lateral views of the hypopharynx swallowing thick barium
demonstrate a small anterior esophageal web in the cervical
esophagus, best seen on lateral projection.

Double contrast imaging shows no evidence for esophageal mass
lesion, stricture, diverticulum or gross mucosal ulceration. Primary
peristalsis is preserved on all swallows with minimal proximal
escape observed. No substantial tertiary contractions or evidence of
presbyesophagus.

13 mm barium tablet passes readily into the stomach when taken with
water.
IMPRESSION: 1. Small anterior esophageal web noted cervical esophagus.
Nonobstructive to a 13 mm barium tablet.
2. Otherwise unremarkable exam.

## 2020-12-30 ENCOUNTER — Other Ambulatory Visit: Payer: Self-pay

## 2020-12-30 ENCOUNTER — Encounter: Payer: Self-pay | Admitting: Internal Medicine

## 2020-12-30 ENCOUNTER — Ambulatory Visit (INDEPENDENT_AMBULATORY_CARE_PROVIDER_SITE_OTHER): Payer: Medicare (Managed Care) | Admitting: Internal Medicine

## 2020-12-30 VITALS — BP 138/77 | HR 66 | Temp 98.2°F | Wt 123.1 lb

## 2020-12-30 DIAGNOSIS — M159 Polyosteoarthritis, unspecified: Secondary | ICD-10-CM

## 2020-12-30 DIAGNOSIS — M8949 Other hypertrophic osteoarthropathy, multiple sites: Secondary | ICD-10-CM | POA: Diagnosis not present

## 2020-12-30 DIAGNOSIS — Z23 Encounter for immunization: Secondary | ICD-10-CM | POA: Diagnosis not present

## 2020-12-30 MED ORDER — CELECOXIB 100 MG PO CAPS: 100.0000 mg | ORAL_CAPSULE | Freq: Two times a day (BID) | ORAL | 2 refills | Status: AC

## 2020-12-30 NOTE — Patient Instructions (Signed)
Ms. Loomer, [place] sorry to hear about your pain. I suspect you are having an arthritis flare up. This can happen in any joint.   I am prescribing you an anti-inflammatory to take twice daily.   You can continue using the voltaren gel.   Please follow-up with Dr. Daryll Drown at her next available for follow-up on your chronic conditions and see how the pain is doing.
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
## 2020-12-31 ENCOUNTER — Encounter: Payer: Self-pay | Admitting: Internal Medicine

## 2020-12-31 NOTE — Assessment & Plan Note (Signed)
Patient presents with 2 weeks history of left shoulder and hip pain without any inciting mechanism of injury. No indication for imaging at this time.  Exam reveals limited ROM in majority of plains due to pain.  Suspect she is having an OA flare. She has taken Meloxicam in the past, but is not currently on a daily anti-inflammatory. Will start her on Celebrex twice daily. She will follow-up with PCP in a few weeks at which time response to treatment can be evaluated.

## 2020-12-31 NOTE — Progress Notes (Signed)
Internal Medicine Clinic Attending  Case discussed with Dr. Bloomfield at the time of the visit.  We reviewed the resident's history and exam and pertinent patient test results.  I agree with the assessment, diagnosis, and plan of care documented in the resident's note.  Delesa Kawa, M.D., Ph.D.  

## 2021-01-19 ENCOUNTER — Encounter: Payer: Self-pay | Admitting: Obstetrics & Gynecology

## 2021-01-27 ENCOUNTER — Other Ambulatory Visit: Payer: Self-pay | Admitting: Physician Assistant

## 2021-01-27 ENCOUNTER — Ambulatory Visit
Admission: RE | Admit: 2021-01-27 | Discharge: 2021-01-27 | Disposition: A | Discharge status: Home / Self Care | Payer: Medicare Other | Source: Ambulatory Visit | Attending: Physician Assistant | Admitting: Physician Assistant

## 2021-01-27 DIAGNOSIS — R1032 Left lower quadrant pain: Secondary | ICD-10-CM

## 2021-01-27 MED ORDER — IOPAMIDOL (ISOVUE-300) INJECTION 61%
100.0000 mL | Freq: Once | INTRAVENOUS | Status: AC | PRN
  Administered 2021-01-27: 100 mL via INTRAVENOUS

## 2021-02-22 ENCOUNTER — Other Ambulatory Visit: Payer: Self-pay

## 2021-02-22 ENCOUNTER — Encounter: Payer: Self-pay | Admitting: Internal Medicine

## 2021-02-22 ENCOUNTER — Ambulatory Visit (INDEPENDENT_AMBULATORY_CARE_PROVIDER_SITE_OTHER): Payer: Medicare (Managed Care) | Admitting: Internal Medicine

## 2021-02-22 VITALS — BP 119/72 | HR 71 | Temp 98.0°F | Ht 59.0 in | Wt 125.9 lb

## 2021-02-22 DIAGNOSIS — I10 Essential (primary) hypertension: Secondary | ICD-10-CM | POA: Diagnosis not present

## 2021-02-22 DIAGNOSIS — M797 Fibromyalgia: Secondary | ICD-10-CM

## 2021-02-22 DIAGNOSIS — E1143 Type 2 diabetes mellitus with diabetic autonomic (poly)neuropathy: Secondary | ICD-10-CM

## 2021-02-22 DIAGNOSIS — M8949 Other hypertrophic osteoarthropathy, multiple sites: Secondary | ICD-10-CM | POA: Diagnosis not present

## 2021-02-22 DIAGNOSIS — R1032 Left lower quadrant pain: Secondary | ICD-10-CM

## 2021-02-22 DIAGNOSIS — M159 Polyosteoarthritis, unspecified: Secondary | ICD-10-CM

## 2021-02-22 LAB — CBC WITH DIFFERENTIAL/PLATELET
Abs Immature Granulocytes: 0.01 10*3/uL (ref 0.00–0.07)
Basophils Absolute: 0 10*3/uL (ref 0.0–0.1)
Basophils Relative: 1 %
Eosinophils Absolute: 0.2 10*3/uL (ref 0.0–0.5)
Eosinophils Relative: 5 %
HCT: 35.8 % — ABNORMAL LOW (ref 36.0–46.0)
Hemoglobin: 10.3 g/dL — ABNORMAL LOW (ref 12.0–15.0)
Immature Granulocytes: 0 %
Lymphocytes Relative: 40 %
Lymphs Abs: 1.3 10*3/uL (ref 0.7–4.0)
MCH: 23.7 pg — ABNORMAL LOW (ref 26.0–34.0)
MCHC: 28.8 g/dL — ABNORMAL LOW (ref 30.0–36.0)
MCV: 82.5 fL (ref 80.0–100.0)
Monocytes Absolute: 0.4 10*3/uL (ref 0.1–1.0)
Monocytes Relative: 11 %
Neutro Abs: 1.4 10*3/uL — ABNORMAL LOW (ref 1.7–7.7)
Neutrophils Relative %: 43 %
Platelets: 244 10*3/uL (ref 150–400)
RBC: 4.34 MIL/uL (ref 3.87–5.11)
RDW: 14.6 % (ref 11.5–15.5)
WBC: 3.4 10*3/uL — ABNORMAL LOW (ref 4.0–10.5)
nRBC: 0 % (ref 0.0–0.2)

## 2021-02-22 LAB — POCT GLYCOSYLATED HEMOGLOBIN (HGB A1C): Hemoglobin A1C: 6.6 % — AB (ref 4.0–5.6)

## 2021-02-22 LAB — SEDIMENTATION RATE: Sed Rate: 11 mm/hr (ref 0–22)

## 2021-02-22 LAB — GLUCOSE, CAPILLARY: Glucose-Capillary: 104 mg/dL — ABNORMAL HIGH (ref 70–99)

## 2021-02-22 NOTE — Patient Instructions (Signed)
Please follow up with your PCP to follow up on diabetes. I have checked some labs today. I will call you with results when they return.

## 2021-02-22 NOTE — Progress Notes (Signed)
Office Visit   Patient ID: Colleen Medina, female    DOB: 30-May-1941, 80 y.o.   MRN: [id number]  Subjective:  CC: left arm pain follow up  HPI 80 y.o. presents today for follow up of left shoulder and hip pain. LOV was 12/30/20 with Dr. Koleen Distance, at which time she was started on celebrex.  Today, she reports some improvement in the left shoulder pain however has persistent left lower quadrant/left groin pain, primarily located over the inguinal ligament.  It is been going on for at least 2 to 3 months.  The quality has not changed over that period of time.  Touching the affected area, even lightly, exacerbates pain.  It is not worsened with activity or with bowel movements.  Celebrex has not helped.  She has not found any alleviating factors.  She denies shingles infection but does report having received the shingles vaccine several years ago. Denies prior or current rash or lesion in the affected area. No prior similar symptoms.   Abdominal CT report from 01/2021 is read as normal kidneys without renal calculi or urinary tract obstruction. The bladder was partially distended but otherwise not commented on. Diverticulosis is noted without signs of diverticulitis. multiple fluid filled loops of small bowel without obstruction. No adnexal masses. No abdominal hernia. No pelvic ascites. No MSK findings.      ACTIVE MEDICATIONS   Outpatient Medications Prior to Visit  Medication Sig Dispense Refill   albuterol (PROAIR HFA) 108 (90 Base) MCG/ACT inhaler Inhale 1-2 puffs into the lungs every 6 (six) hours as needed for shortness of breath. 1 Inhaler 3   aspirin EC 81 MG tablet Take 1 tablet (81 mg total) by mouth daily. 90 tablet 3   celecoxib (CELEBREX) 100 MG capsule Take 1 capsule (100 mg total) by mouth 2 (two) times daily. 60 capsule 2   diclofenac sodium (VOLTAREN) 1 % GEL Apply 2 g topically 4 (four) times daily. To right shoulder 100 g 1   fluticasone (FLONASE) 50 MCG/ACT nasal  spray Place 1 spray into both nostrils daily. (Patient taking differently: Place 1 spray into both nostrils daily as needed for allergies. ) 16 g 0   lisinopril (ZESTRIL) 20 MG tablet Take 1 tablet by mouth once daily 90 tablet 3   nystatin cream (MYCOSTATIN) Apply 1 application topically 4 (four) times daily. To roof of mouth 30 g 3   ondansetron (ZOFRAN) 4 MG tablet Take 4 mg by mouth every 8 (eight) hours as needed for nausea or vomiting.     pantoprazole (PROTONIX) 40 MG tablet Take 40 mg by mouth.     PHENADOZ 25 MG suppository Place 25 mg rectally every 6 (six) hours as needed for nausea or vomiting.      rosuvastatin (CRESTOR) 20 MG tablet TAKE 1 TABLET BY MOUTH AT BEDTIME (Patient taking differently: Take 20 mg by mouth at bedtime. ) 90 tablet 3   triamcinolone cream (KENALOG) 0.1 % Apply 1 application topically 4 (four) times daily. To roof of mouth 30 g 3   vitamin B-12 1000 MCG tablet Take 1 tablet (1,000 mcg total) by mouth daily. 30 tablet 1   No facility-administered medications prior to visit.     ROS  Review of Systems  Constitutional: Negative for appetite change, chills and fever.  Gastrointestinal: Positive for abdominal pain. Negative for abdominal distention, blood in stool, constipation, diarrhea, nausea and vomiting.  Genitourinary: Negative for difficulty urinating, dysuria, flank pain, frequency, genital sores, hematuria, pelvic  pain and vaginal bleeding.  Musculoskeletal: Negative for joint swelling.  Skin: Negative for color change, rash and wound.    Objective:   BP 119/72 (BP Location: Left Arm, Patient Position: Sitting, Cuff Size: Normal)    Pulse 71    Temp 98 F (36.7 C) (Oral)    Ht 4' 11"  (1.499 m)    Wt 125 lb 14.4 oz (57.1 kg)    SpO2 100% Comment: room air   BMI 25.43 kg/m  Wt Readings from Last 3 Encounters:  02/22/21 125 lb 14.4 oz (57.1 kg)  12/30/20 123 lb 1.6 oz (55.8 kg)  06/03/20 126 lb 8 oz (57.4 kg)   BP Readings from Last 3  Encounters:  02/22/21 119/72  12/30/20 138/77  06/03/20 (!) 172/73   Physical Exam General: elderly appearing Abdomen: not distended. No masses appreciable on visual exam. bs active. Tenderness to light touch along the left inguinal area.  No tenderness to palpation in the upper quadrants. Tenderness to palpation of the suprapubic, left and right lower quadrants. No palpable masses. No abdominal or inguinal hernia appreciated with valsalva or abdominal flexion. No flank or CVA tenderness. Skin: no rash or lesion on the abdomen or inguinal region MSK: left groin pain not reproducible with hip ROM. Normal gait. Climbed onto the exam table easily and without assist.  Health Maintenance:   Health Maintenance  Topic Date Due   OPHTHALMOLOGY EXAM  12/27/2019   COVID-19 Vaccine (3 - Booster for Pfizer series) 09/14/2020   FOOT EXAM  06/03/2021   HEMOGLOBIN A1C  08/25/2021   TETANUS/TDAP  06/03/2030   INFLUENZA VACCINE  Completed   DEXA SCAN  Completed   Hepatitis C Screening  Completed   PNA vac Low Risk Adult  Completed   HPV VACCINES  Aged Out     Assessment & Plan:   Problem List Items Addressed This Visit      Cardiovascular and Mediastinum   Essential hypertension (Chronic)    Current medications: lisinopril 41m Denies adverse medication effects. Blood pressure is at goal in the office today. Slight decline in renal function since last year. GFR 77>54, Creatinine up 0.8>1.05. electrolytes stable. Plan -continue current management -recommend repeating labs in 3-6 months.         Digestive   Well controlled type 2 diabetes mellitus [place]roparesis [place]    Remains well controlled on diet alone. A1C 6.6 at today's visit.  Foot exam is up to date--next due in June. She is due for retinal screening. Discussed this with her and her daughter who are agreeable to setting up an appointment.       Relevant Orders   POC Hbg A1C (Completed)    Glucose, capillary (Completed)     Musculoskeletal and Integument   Osteoarthritis (Chronic)    Left shoulder pain has improved with celebrex.         Other   Fibromyalgia syndrome (Chronic)    Symptoms seem to be neuropathic.   I considered shingles given the apparent dermatomal distribution and hyperanalgesia however she has received the shingles vaccines and has not had any lesions or rash. This pain could proceed the appearance of lesions however given the length of time it has been going on (2-3 months), this seems less likely. Diabetic neuropathy is less likely as her A1C is 6.6. I considered a urogenital cause as well. Abdominal scan from February is reassuring. Furthermore, she has not experienced any urinary symptoms or hematuria and would  not expect the pain to be reproducible with light touch. I considered constipation or diverticulitis however she reports daily bowel movements. Again, these pain associated with these conditions should not be so superficial.  MSK including hernia or arthritis also considered however I do not appreciate a hernia on exam and CT was unremarkable for either of these.  Of note, she does have a history of fibromyalgia that seemed to improve with amitriptyline. Unfortunately, this also seemed to worsen her stress incontinence. She did not get relief from gabapentin or lyrica which were tried prior to amitriptyline.   Plan -will check a UA today to r/o hematuria -check CBC given her history of anemia -check CMP for LFTs -check ESR, CRP for inflammatory workup -further management pending these results  Addendum #1 02/24/21 :  -no hematuria on UA, findings not consistent with infection -CBC and CMP unrevealing -ESR 11 (normal), CRP <1 (normal) Assessment: given the lack of findings on exam, labs and imaging, I suspect that this is related to fibromyalgia. Cholinergic effects from amitriptyline are certainly of concern, especially as she gets older. She  may benefit from duloxetine given her history of depression and fibromyalgia.  I called and updated her on her labs and suspected cause of her pain. Discussed with her that she may benefit from duloxetine which she is agreeable to trying. Will start duloxetine at 98m daily x1w, then increase it to 675m F/u in 4-6w for symptom recheck.       Relevant Medications   DULoxetine (CYMBALTA) 30 MG capsule    Other Visit Diagnoses    LLQ pain       Relevant Orders   CBC with Diff (Completed)   CMP14 + Anion Gap (Completed)   CRP (C-Reactive Protein) (Completed)   Urinalysis, Reflex Microscopic (Completed)   Sed Rate (ESR) (Completed)   Microscopic Examination (Completed)     Follow up: 4-6w for symptom recheck    Pt discussed with Dr. RaClista BernhardtMD Internal Medicine Resident PGY-2 [facility] Pager: #[phone]/08/2021 10:04 AM

## 2021-02-23 LAB — MICROSCOPIC EXAMINATION
Casts: NONE SEEN /lpf
Epithelial Cells (non renal): >10 /hpf — AB (ref 0–10)
RBC, Urine: NONE SEEN /hpf (ref 0–2)

## 2021-02-23 LAB — URINALYSIS, ROUTINE W REFLEX MICROSCOPIC
Bilirubin, UA: NEGATIVE
Glucose, UA: NEGATIVE
Nitrite, UA: NEGATIVE
Protein,UA: NEGATIVE
RBC, UA: NEGATIVE
Specific Gravity, UA: 1.027 (ref 1.005–1.030)
Urobilinogen, Ur: 1 mg/dL (ref 0.2–1.0)
pH, UA: 5 (ref 5.0–7.5)

## 2021-02-23 LAB — CMP14 + ANION GAP
ALT: 11 IU/L (ref 0–32)
AST: 18 IU/L (ref 0–40)
Albumin/Globulin Ratio: 1.6 (ref 1.2–2.2)
Albumin: 4.4 g/dL (ref 3.7–4.7)
Alkaline Phosphatase: 97 IU/L (ref 44–121)
Anion Gap: 15 mmol/L (ref 10.0–18.0)
BUN/Creatinine Ratio: 22 (ref 12–28)
BUN: 23 mg/dL (ref 8–27)
Bilirubin Total: 0.4 mg/dL (ref 0.0–1.2)
CO2: 24 mmol/L (ref 20–29)
Calcium: 9.3 mg/dL (ref 8.7–10.3)
Chloride: 101 mmol/L (ref 96–106)
Creatinine, Ser: 1.05 mg/dL — ABNORMAL HIGH (ref 0.57–1.00)
Globulin, Total: 2.7 g/dL (ref 1.5–4.5)
Glucose: 94 mg/dL (ref 65–99)
Potassium: 4.6 mmol/L (ref 3.5–5.2)
Sodium: 140 mmol/L (ref 134–144)
Total Protein: 7.1 g/dL (ref 6.0–8.5)
eGFR: 55 mL/min/{1.73_m2} — ABNORMAL LOW (ref >59)

## 2021-02-23 LAB — C-REACTIVE PROTEIN: CRP: <1 mg/L (ref 0–10)

## 2021-02-24 DIAGNOSIS — R1032 Left lower quadrant pain: Secondary | ICD-10-CM | POA: Insufficient documentation

## 2021-02-24 MED ORDER — DULOXETINE HCL 30 MG PO CPEP: ORAL_CAPSULE | ORAL | 0 refills | Status: AC

## 2021-02-24 NOTE — Assessment & Plan Note (Deleted)
Symptoms seem to be neuropathic.   I considered shingles given the apparent dermatomal distribution and hyperanalgesia however Colleen Medina has received the shingles vaccines and has not had any lesions or rash. This pain could proceed the appearance of lesions however given the length of time it has been going on (2-3 months), this seems less likely.  I considered a urogenital cause as well. Abdominal scan from February is reassuring. Furthermore, Colleen Medina has not experienced any urinary symptoms or hematuria and would not expect the pain to be reproducible with light touch. I considered constipation or diverticulitis however Colleen Medina reports daily bowel movements. Again, these pain associated with these conditions should not be so superficial.  MSK including hernia or arthritis also considered however I do not appreciate a hernia on exam and CT was unremarkable for either of these.  Of note, Colleen Medina does have a history of fibromyalgia that seemed to improve with amitriptyline. Unfortunately, this also seemed to worsen her stress incontinence. Colleen Medina did not get relief from gabapentin or lyrica which were tried prior to amitriptyline.   Plan -will check a UA today to r/o hematuria -check CBC given her history of anemia -check CMP for LFTs -check ESR, CRP for inflammatory workup -further management pending these results  Addendum #1 02/24/21 :  -no hematuria on UA, findings not consistent with infection -CBC and CMP unrevealing -ESR 11 (normal), CRP <1 (normal) Assessment: given the lack of findings on exam, labs and imaging, I suspect that this is related to fibromyalgia. Cholinergic effects from amitriptyline are certainly of concern, especially as Colleen Medina gets older. Colleen Medina may benefit from duloxetine given her history of depression and fibromyalgia.  I called and updated her on her labs and suspected cause of her pain. Discussed with her that Colleen Medina may benefit from duloxetine which Colleen Medina is agreeable to trying. Will start  duloxetine at 21m daily x1w, then increase it to 634m F/u in 4-6w for symptom recheck.

## 2021-02-24 NOTE — Assessment & Plan Note (Signed)
Left shoulder pain has improved with celebrex.

## 2021-02-24 NOTE — Assessment & Plan Note (Signed)
Remains well controlled on diet alone. A1C 6.6 at today's visit.  Foot exam is up to date--next due in June. She is due for retinal screening. Discussed this with her and her daughter who are agreeable to setting up an appointment.

## 2021-02-24 NOTE — Assessment & Plan Note (Signed)
Symptoms seem to be neuropathic.   I considered shingles given the apparent dermatomal distribution and hyperanalgesia however she has received the shingles vaccines and has not had any lesions or rash. This pain could proceed the appearance of lesions however given the length of time it has been going on (2-3 months), this seems less likely. Diabetic neuropathy is less likely as her A1C is 6.6. I considered a urogenital cause as well. Abdominal scan from February is reassuring. Furthermore, she has not experienced any urinary symptoms or hematuria and would not expect the pain to be reproducible with light touch. I considered constipation or diverticulitis however she reports daily bowel movements. Again, these pain associated with these conditions should not be so superficial.  MSK including hernia or arthritis also considered however I do not appreciate a hernia on exam and CT was unremarkable for either of these.  Of note, she does have a history of fibromyalgia that seemed to improve with amitriptyline. Unfortunately, this also seemed to worsen her stress incontinence. She did not get relief from gabapentin or lyrica which were tried prior to amitriptyline.   Plan -will check a UA today to r/o hematuria -check CBC given her history of anemia -check CMP for LFTs -check ESR, CRP for inflammatory workup -further management pending these results  Addendum #1 02/24/21 :  -no hematuria on UA, findings not consistent with infection -CBC and CMP unrevealing -ESR 11 (normal), CRP <1 (normal) Assessment: given the lack of findings on exam, labs and imaging, I suspect that this is related to fibromyalgia. Cholinergic effects from amitriptyline are certainly of concern, especially as she gets older. She may benefit from duloxetine given her history of depression and fibromyalgia.  I called and updated her on her labs and suspected cause of her pain. Discussed with her that she may benefit from  duloxetine which she is agreeable to trying. Will start duloxetine at 5m daily x1w, then increase it to 695m F/u in 4-6w for symptom recheck.

## 2021-02-24 NOTE — Assessment & Plan Note (Signed)
Current medications: lisinopril 20mg  Denies adverse medication effects. Blood pressure is at goal in the office today. Slight decline in renal function since last year. GFR 77>54, Creatinine up 0.8>1.05. electrolytes stable. Plan -continue current management -recommend repeating labs in 3-6 months.

## 2021-03-01 ENCOUNTER — Encounter: Payer: Medicare Other | Admitting: Internal Medicine

## 2021-03-02 NOTE — Progress Notes (Signed)
Internal Medicine Clinic Attending  Case discussed with Dr. Christian  At the time of the visit.  We reviewed the resident's history and exam and pertinent patient test results.  I agree with the assessment, diagnosis, and plan of care documented in the resident's note.  

## 2021-03-24 ENCOUNTER — Encounter: Payer: Self-pay | Admitting: *Deleted

## 2021-03-24 NOTE — Progress Notes (Signed)

## 2021-05-14 ENCOUNTER — Encounter: Payer: Medicare Other | Admitting: Internal Medicine

## 2021-06-22 ENCOUNTER — Encounter: Payer: Self-pay | Admitting: *Deleted

## 2021-07-29 NOTE — Progress Notes (Signed)
Things That May Be Affecting Your Health:  Alcohol  Hearing loss  Pain    Depression  Home Safety  Sexual Health  X Diabetes  Lack of physical activity  Stress   Difficulty with daily activities  Loneliness  Tiredness   Drug use X Medicines  Tobacco use   Falls  Motor Vehicle Safety  Weight   Food choices  Oral Health X Other    YOUR PERSONALIZED HEALTH PLAN : 1. Schedule your next subsequent Medicare Wellness visit in one year 2. Attend all of your regular appointments to address your medical issues 3. Complete the preventative screenings and services   Annual Wellness Visit   Medicare Covered Preventative Screenings and [place] Men and Women Who How Often Need? Date of Last Service Action  Abdominal Aortic Aneurysm Adults with AAA risk factors Once N     Alcohol Misuse and Counseling All Adults Screening once a year if no alcohol misuse. Counseling up to 4 face to face sessions. N    Bone Density Measurement  Adults at risk for osteoporosis Once every 2 yrs Y 2016 - osteopenia   Lipid Panel Z13.6 All adults without CV disease Once every 5 yrs N 2021    Colorectal Cancer  Stool sample or Colonoscopy All adults 35 and older  Once every year Every 10 years N 2012 - graduated     Depression All Adults Once a year N Today   Diabetes Screening Blood glucose, post glucose load, or GTT Z13.1 All adults at risk Pre-diabetics Once per year Twice per year N 2022, diabetic    Diabetes  Self-Management Training All adults Diabetics 10 hrs first year; 2 hours subsequent years. Requires Copay Y If interested   Glaucoma Diabetics Family history of glaucoma African Americans 28 yrs + Hispanic Americans 19 yrs + Annually - requires coppay Y Due for diabetic eye exam as well    Hepatitis C Z72.89 or F19.20 High Risk for HCV Born between 1945 and 1965 Annually Once N 2021    HIV Z11.4 All adults based on risk Annually btw ages 78 & 20 regardless of  risk Annually > 47 yrs if at increased risk N Never done, no increased risk    Lung Cancer Screening Asymptomatic adults aged 53-77 with 30 pack yr history and current smoker OR quit within the last 15 yrs Annually Must have counseling and shared decision making documentation before first screen N Never smoker    Medical Nutrition Therapy Adults with  Diabetes Renal disease Kidney transplant within past 3 yrs 3 hours first year; 2 hours subsequent years Y If interested   Obesity and Counseling All adults Screening once a year Counseling if BMI 30 or higher N Today   Tobacco Use Counseling Adults who use tobacco  Up to 8 visits in one year N    Vaccines Z23 Hepatitis B Influenza  Pneumonia  Adults  Once Once every flu season Two different vaccines separated by one year Y Shingrix COVID booster   Next Annual Wellness Visit People with Medicare Every year  Today     Services & Screenings Women Who How Often Need  Date of Last Service Action  Mammogram  Z12.31 Women over 64 One baseline ages 36-39. Annually ager 29 yrs+ Y 2017  Discuss with patient  Pap tests All women Annually if high risk. Every 2 yrs for normal risk women N   Graduated  Screening for cervical cancer with  Pap (Z01.419  nl or Z01.411abnl) & HPV Z11.51 Women aged 71 to 92 Once every 5 yrs     Screening pelvic and breast exams All women Annually if high risk. Every 2 yrs for normal risk women     Sexually Transmitted Diseases Chlamydia Gonorrhea Syphilis All at risk adults Annually for non pregnant females at increased risk         [place] Men Who How Ofter Need  Date of Last Service Action  Prostate Cancer - DRE & PSA Men over 50 Annually.  DRE might require a copay.        Sexually Transmitted Diseases Syphilis All at risk adults Annually for men at increased risk      Health Maintenance List Health Maintenance  Topic Date Due   Zoster Vaccines- Shingrix (1 of 2) Never done    OPHTHALMOLOGY EXAM  12/27/2019   COVID-19 Vaccine (3 - Booster for Pfizer series) 08/14/2020   FOOT EXAM  06/03/2021   INFLUENZA VACCINE  07/19/2021   HEMOGLOBIN A1C  08/25/2021   TETANUS/TDAP  06/03/2030   DEXA SCAN  Completed   Hepatitis C Screening  Completed   PNA vac Low Risk Adult  Completed   HPV VACCINES  Aged Out

## 2021-12-29 ENCOUNTER — Other Ambulatory Visit (HOSPITAL_COMMUNITY): Payer: Self-pay | Admitting: Student

## 2021-12-29 DIAGNOSIS — I10 Essential (primary) hypertension: Secondary | ICD-10-CM

## 2022-01-04 ENCOUNTER — Other Ambulatory Visit: Payer: Self-pay

## 2022-01-04 ENCOUNTER — Ambulatory Visit (HOSPITAL_COMMUNITY)
Admission: RE | Admit: 2022-01-04 | Discharge: 2022-01-04 | Disposition: A | Discharge status: Home / Self Care | Payer: Medicare HMO | Source: Ambulatory Visit | Attending: Student | Admitting: Student

## 2022-01-04 DIAGNOSIS — I083 Combined rheumatic disorders of mitral, aortic and tricuspid valves: Secondary | ICD-10-CM | POA: Diagnosis not present

## 2022-01-04 DIAGNOSIS — E119 Type 2 diabetes mellitus without complications: Secondary | ICD-10-CM | POA: Insufficient documentation

## 2022-01-04 DIAGNOSIS — I10 Essential (primary) hypertension: Secondary | ICD-10-CM | POA: Diagnosis present

## 2022-01-04 DIAGNOSIS — E785 Hyperlipidemia, unspecified: Secondary | ICD-10-CM | POA: Diagnosis not present

## 2022-01-04 LAB — ECHOCARDIOGRAM COMPLETE
Area-P 1/2: 2.56 cm2
P 1/2 time: 644 msec
S' Lateral: 2.9 cm

## 2022-01-28 ENCOUNTER — Ambulatory Visit: Payer: Self-pay | Admitting: Orthopedic Surgery

## 2022-01-28 DIAGNOSIS — E1143 Type 2 diabetes mellitus with diabetic autonomic (poly)neuropathy: Secondary | ICD-10-CM

## 2022-02-08 NOTE — Progress Notes (Addendum)
Anesthesia Review:  PCP: [place] states [place] [facility] had to clear her for surgery.  Called [facility] and requested clearance.  LVMM.  Clearance on chart Cardiologist : none  Chest x-ray : EKG :02/11/22  and 11/29/21 ib chart  Echo : 01/04/22  Stress test: Cardiac Cath :  Activity level: cna do a flgiht of stairs without difficulty  Sleep Study/ CPAP : none  Fasting Blood Sugar :      / Checks Blood Sugar -- times a day:   Blood Thinner/ Instructions /Last Dose: ASA / Instructions/ Last Dose :   Covid test- 02/21/22  0915 am  81 mg Aspirin  DM- diet controlled does not check glucose at home  Hgba1c-02/11/22- 6.0  AT preop apapt on 02/11/22- glucose was 89 on arrival.  PT given 6 peanut butter crackers and apple juice.  After 15 minutes glucose up to 101.  PT voices no complaints.  PT stated she was unsure if she was supposed to eat or not prior to preop appt and has not eaten all day.  Cbc done 02/11/22- routed to DR Swinteck .hgb 9.7 .   PT reported at preop that Dr Lyla Glassing informed her that he would not do her surgery if hgb was not above 9.0   Labs from NP dated 11/30/21 on chart

## 2022-02-09 NOTE — Progress Notes (Addendum)
Cymbalta, omeprazole Covid test on 02/21/22.   Cp,e thru ,main entrance at [facility].  Have a seat in the lobby on th eright as you come thru the door.  Call [phone] and let them know you are here for covid testing.             Your procedure is scheduled on:   02/24/22.   Report to [facility] Main  Entrance   Report to admitting at   1045AM     Call this number if you have problems the morning of surgery [phone]    REMEMBER: NO  SOLID FOOD CANDY OR GUM or mints  AFTER MIDNIGHT. Nite before surgery.  CLEAR LIQUIDS UNTIL        1030am    morning of surgery.   . NOTHING BY MOUTH EXCEPT CLEAR LIQUIDS UNTIL   1030am   morning of surgery.   Marland Kitchen PLEASE FINISH ENSURE DRINK PER SURGEON ORDER  WHICH NEEDS TO BE COMPLETED AT     1030am . Morning of surgery.       CLEAR LIQUID DIET   Foods Allowed                                                                    Coffee and tea, regular and decaf         no mil, cream or creamer                    Fruit ices (not with fruit pulp)                                      Iced Popsicles                                    Carbonated beverages, regular and diet                                    White cranberry, white grape and apple juices  Sports drinks like Gatorade Lightly seasoned clear broth or consume(fat free) Sugar, honey syrup ___________________________________________________________________      BRUSH YOUR TEETH MORNING OF SURGERY AND RINSE YOUR MOUTH OUT, NO CHEWING GUM CANDY OR MINTS.     Take these medicines the morning of surgery with A SIP OF WATER:  inhalers as usual and bring, cymbalta and omeprazole     DO NOT TAKE ANY DIABETIC MEDICATIONS DAY OF YOUR SURGERY                               You may not have any metal on your body including hair pins and              piercings  Do not wear jewelry, make-up, lotions, powders or perfumes, deodorant             Do not wear nail polish on your fingernails.  Do  not shave  48 hours prior to  surgery.              Men may shave face and neck.   Do not bring valuables to the hospital. [place].  Contacts, dentures or bridgework may not be worn into surgery.  Leave suitcase in the car. After surgery it may be brought to your room.     Patients discharged the day of surgery will not be allowed to drive home. IF YOU ARE HAVING SURGERY AND GOING HOME THE SAME DAY, YOU MUST HAVE AN ADULT TO DRIVE YOU HOME AND BE WITH YOU FOR 24 HOURS. YOU MAY GO HOME BY TAXI OR UBER OR ORTHERWISE, BUT AN ADULT MUST ACCOMPANY YOU HOME AND STAY WITH YOU FOR 24 HOURS.  Name and phone number of your driver:  Special Instructions: N/A              Please read over the following fact sheets you were given: _____________________________________________________________________  [facility] - Preparing for Surgery Before surgery, you can play an important role.  Because skin is not sterile, your skin needs to be as free of germs as possible.  You can reduce the number of germs on your skin by washing with CHG (chlorahexidine gluconate) soap before surgery.  CHG is an antiseptic cleaner which kills germs and bonds with the skin to continue killing germs even after washing. Please DO NOT use if you have an allergy to CHG or antibacterial soaps.  If your skin becomes reddened/irritated stop using the CHG and inform your nurse when you arrive at Short Stay. Do not shave (including legs and underarms) for at least 48 hours prior to the first CHG shower.  You may shave your face/neck. Please follow these instructions carefully:  1.  Shower with CHG Soap the night before surgery and the  morning of Surgery.  2.  If you choose to wash your hair, wash your hair first as usual with your  normal  shampoo.  3.  After you shampoo, rinse your hair and body thoroughly to remove the  shampoo.                           4.  Use CHG as you would any  other liquid soap.  You can apply chg directly  to the skin and wash                       Gently with a scrungie or clean washcloth.  5.  Apply the CHG Soap to your body ONLY FROM THE NECK DOWN.   Do not use on face/ open                           Wound or open sores. Avoid contact with eyes, ears mouth and genitals (private parts).                       Wash face,  Genitals (private parts) with your normal soap.             6.  Wash thoroughly, paying special attention to the area where your surgery  will be performed.  7.  Thoroughly rinse your body with warm water from the neck down.  8.  DO NOT shower/wash with your normal soap after  using and rinsing off  the CHG Soap.                9.  Pat yourself dry with a clean towel.            10.  Wear clean pajamas.            11.  Place clean sheets on your bed the night of your first shower and do not  sleep with pets. Day of Surgery : Do not apply any lotions/deodorants the morning of surgery.  Please wear clean clothes to the hospital/surgery center.  FAILURE TO FOLLOW THESE INSTRUCTIONS MAY RESULT IN THE CANCELLATION OF YOUR SURGERY PATIENT SIGNATURE_________________________________  NURSE SIGNATURE__________________________________  ________________________________________________________________________

## 2022-02-11 ENCOUNTER — Encounter (HOSPITAL_COMMUNITY)
Admission: RE | Admit: 2022-02-11 | Discharge: 2022-02-11 | Disposition: A | Discharge status: Home / Self Care | Payer: Medicare HMO | Source: Ambulatory Visit | Attending: Orthopedic Surgery | Admitting: Orthopedic Surgery

## 2022-02-11 ENCOUNTER — Other Ambulatory Visit: Payer: Self-pay

## 2022-02-11 ENCOUNTER — Encounter (HOSPITAL_COMMUNITY): Payer: Self-pay

## 2022-02-11 VITALS — BP 152/80 | HR 61 | Temp 98.4°F | Resp 16 | Ht 59.0 in | Wt 114.0 lb

## 2022-02-11 DIAGNOSIS — I10 Essential (primary) hypertension: Secondary | ICD-10-CM | POA: Insufficient documentation

## 2022-02-11 DIAGNOSIS — Z01818 Encounter for other preprocedural examination: Secondary | ICD-10-CM | POA: Diagnosis not present

## 2022-02-11 DIAGNOSIS — E1143 Type 2 diabetes mellitus with diabetic autonomic (poly)neuropathy: Secondary | ICD-10-CM | POA: Insufficient documentation

## 2022-02-11 HISTORY — DX: Fibromyalgia: M79.7

## 2022-02-11 HISTORY — DX: Personal history of urinary calculi: Z87.442

## 2022-02-11 LAB — CBC
HCT: 33.1 % — ABNORMAL LOW (ref 36.0–46.0)
Hemoglobin: 9.7 g/dL — ABNORMAL LOW (ref 12.0–15.0)
MCH: 23.7 pg — ABNORMAL LOW (ref 26.0–34.0)
MCHC: 29.3 g/dL — ABNORMAL LOW (ref 30.0–36.0)
MCV: 80.7 fL (ref 80.0–100.0)
Platelets: 243 10*3/uL (ref 150–400)
RBC: 4.1 MIL/uL (ref 3.87–5.11)
RDW: 15.1 % (ref 11.5–15.5)
WBC: 4.2 10*3/uL (ref 4.0–10.5)
nRBC: 0 % (ref 0.0–0.2)

## 2022-02-11 LAB — BASIC METABOLIC PANEL
Anion gap: 8 (ref 5–15)
BUN: 18 mg/dL (ref 8–23)
CO2: 25 mmol/L (ref 22–32)
Calcium: 9.1 mg/dL (ref 8.9–10.3)
Chloride: 102 mmol/L (ref 98–111)
Creatinine, Ser: 0.9 mg/dL (ref 0.44–1.00)
GFR, Estimated: >60 mL/min (ref >60)
Glucose, Bld: 96 mg/dL (ref 70–99)
Potassium: 3.8 mmol/L (ref 3.5–5.1)
Sodium: 135 mmol/L (ref 135–145)

## 2022-02-11 LAB — SURGICAL PCR SCREEN
MRSA, PCR: NEGATIVE
Staphylococcus aureus: NEGATIVE

## 2022-02-11 LAB — GLUCOSE, CAPILLARY
Glucose-Capillary: 101 mg/dL — ABNORMAL HIGH (ref 70–99)
Glucose-Capillary: 89 mg/dL (ref 70–99)

## 2022-02-12 LAB — HEMOGLOBIN A1C
Hgb A1c MFr Bld: 6 % — ABNORMAL HIGH (ref 4.8–5.6)
Mean Plasma Glucose: 125.5 mg/dL

## 2022-02-21 ENCOUNTER — Other Ambulatory Visit: Payer: Self-pay

## 2022-02-21 ENCOUNTER — Encounter (HOSPITAL_COMMUNITY)
Admission: RE | Admit: 2022-02-21 | Discharge: 2022-02-21 | Disposition: A | Discharge status: Home / Self Care | Payer: Medicare HMO | Source: Ambulatory Visit | Attending: Orthopedic Surgery | Admitting: Orthopedic Surgery

## 2022-02-21 DIAGNOSIS — Z01812 Encounter for preprocedural laboratory examination: Secondary | ICD-10-CM | POA: Diagnosis not present

## 2022-02-21 DIAGNOSIS — Z20822 Contact with and (suspected) exposure to covid-19: Secondary | ICD-10-CM | POA: Diagnosis not present

## 2022-02-21 LAB — SARS CORONAVIRUS 2 (TAT 6-24 HRS): SARS Coronavirus 2: NEGATIVE

## 2022-02-22 ENCOUNTER — Ambulatory Visit: Payer: Self-pay | Admitting: Orthopedic Surgery
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
## 2022-02-23 ENCOUNTER — Encounter (HOSPITAL_COMMUNITY): Payer: Self-pay | Admitting: Orthopedic Surgery

## 2022-02-24 ENCOUNTER — Ambulatory Visit (HOSPITAL_COMMUNITY)
Admission: RE | Admit: 2022-02-24 | Discharge: 2022-02-26 | Disposition: A | Discharge status: Home / Self Care | Payer: Medicare HMO | Source: Ambulatory Visit | Attending: Orthopedic Surgery | Admitting: Orthopedic Surgery

## 2022-02-24 ENCOUNTER — Encounter (HOSPITAL_COMMUNITY)
Admission: RE | Disposition: A | Discharge status: Home / Self Care | Payer: Self-pay | Source: Ambulatory Visit | Attending: Orthopedic Surgery

## 2022-02-24 ENCOUNTER — Ambulatory Visit (HOSPITAL_COMMUNITY): Payer: Medicare HMO

## 2022-02-24 ENCOUNTER — Other Ambulatory Visit: Payer: Self-pay

## 2022-02-24 ENCOUNTER — Ambulatory Visit (HOSPITAL_BASED_OUTPATIENT_CLINIC_OR_DEPARTMENT_OTHER): Payer: Medicare HMO | Admitting: Anesthesiology

## 2022-02-24 ENCOUNTER — Ambulatory Visit (HOSPITAL_COMMUNITY): Payer: Medicare HMO | Admitting: Physician Assistant

## 2022-02-24 ENCOUNTER — Encounter (HOSPITAL_COMMUNITY): Payer: Self-pay | Admitting: Orthopedic Surgery

## 2022-02-24 DIAGNOSIS — M797 Fibromyalgia: Secondary | ICD-10-CM | POA: Diagnosis not present

## 2022-02-24 DIAGNOSIS — I700 Atherosclerosis of aorta: Secondary | ICD-10-CM | POA: Diagnosis not present

## 2022-02-24 DIAGNOSIS — K219 Gastro-esophageal reflux disease without esophagitis: Secondary | ICD-10-CM | POA: Insufficient documentation

## 2022-02-24 DIAGNOSIS — E663 Overweight: Secondary | ICD-10-CM | POA: Insufficient documentation

## 2022-02-24 DIAGNOSIS — M1711 Unilateral primary osteoarthritis, right knee: Secondary | ICD-10-CM | POA: Insufficient documentation

## 2022-02-24 DIAGNOSIS — Z6823 Body mass index (BMI) 23.0-23.9, adult: Secondary | ICD-10-CM | POA: Diagnosis not present

## 2022-02-24 DIAGNOSIS — F32A Depression, unspecified: Secondary | ICD-10-CM | POA: Diagnosis not present

## 2022-02-24 DIAGNOSIS — Z96651 Presence of right artificial knee joint: Secondary | ICD-10-CM

## 2022-02-24 DIAGNOSIS — Z79899 Other long term (current) drug therapy: Secondary | ICD-10-CM | POA: Insufficient documentation

## 2022-02-24 DIAGNOSIS — M8580 Other specified disorders of bone density and structure, unspecified site: Secondary | ICD-10-CM | POA: Insufficient documentation

## 2022-02-24 DIAGNOSIS — J45909 Unspecified asthma, uncomplicated: Secondary | ICD-10-CM | POA: Insufficient documentation

## 2022-02-24 DIAGNOSIS — I10 Essential (primary) hypertension: Secondary | ICD-10-CM | POA: Diagnosis not present

## 2022-02-24 DIAGNOSIS — E119 Type 2 diabetes mellitus without complications: Secondary | ICD-10-CM | POA: Diagnosis not present

## 2022-02-24 DIAGNOSIS — I088 Other rheumatic multiple valve diseases: Secondary | ICD-10-CM | POA: Insufficient documentation

## 2022-02-24 HISTORY — PX: KNEE ARTHROPLASTY: SHX992

## 2022-02-24 LAB — TYPE AND SCREEN
ABO/RH(D): B POS
Antibody Screen: NEGATIVE

## 2022-02-24 LAB — ABO/RH: ABO/RH(D): B POS

## 2022-02-24 LAB — GLUCOSE, CAPILLARY
Glucose-Capillary: 78 mg/dL (ref 70–99)
Glucose-Capillary: 130 mg/dL — ABNORMAL HIGH (ref 70–99)

## 2022-02-24 SURGERY — ARTHROPLASTY, KNEE, TOTAL, USING IMAGELESS COMPUTER-ASSISTED NAVIGATION
Anesthesia: Spinal | Site: Knee | Laterality: Right

## 2022-02-24 MED ORDER — MORPHINE SULFATE (PF) 2 MG/ML IV SOLN
0.5000 mg | INTRAVENOUS | Status: DC | PRN
  Administered 2022-02-25: 0.5 mg via INTRAVENOUS
  Filled 2022-02-24: qty 1

## 2022-02-24 MED ORDER — ASPIRIN 81 MG PO CHEW
81.0000 mg | CHEWABLE_TABLET | Freq: Two times a day (BID) | ORAL | Status: DC
  Administered 2022-02-24 – 2022-02-26 (×4): 81 mg via ORAL
  Filled 2022-02-24 (×4): qty 1

## 2022-02-24 MED ORDER — ACETAMINOPHEN 10 MG/ML IV SOLN: 1000.0000 mg | Freq: Once | INTRAVENOUS | Status: DC | PRN

## 2022-02-24 MED ORDER — PHENOL 1.4 % MT LIQD: 1.0000 | OROMUCOSAL | Status: DC | PRN

## 2022-02-24 MED ORDER — KETOROLAC TROMETHAMINE 30 MG/ML IJ SOLN
INTRAMUSCULAR | Status: AC
  Filled 2022-02-24: qty 1

## 2022-02-24 MED ORDER — BUPIVACAINE-EPINEPHRINE 0.25% -1:200000 IJ SOLN
INTRAMUSCULAR | Status: DC | PRN
  Administered 2022-02-24: 30 mL

## 2022-02-24 MED ORDER — ACETAMINOPHEN 160 MG/5ML PO SOLN: 325.0000 mg | ORAL | Status: DC | PRN

## 2022-02-24 MED ORDER — ALBUTEROL SULFATE HFA 108 (90 BASE) MCG/ACT IN AERS: 1.0000 | INHALATION_SPRAY | Freq: Four times a day (QID) | RESPIRATORY_TRACT | Status: DC | PRN

## 2022-02-24 MED ORDER — PROPOFOL 10 MG/ML IV BOLUS
INTRAVENOUS | Status: DC | PRN
  Administered 2022-02-24 (×2): 20 mg via INTRAVENOUS

## 2022-02-24 MED ORDER — ONDANSETRON HCL 4 MG/2ML IJ SOLN
INTRAMUSCULAR | Status: DC | PRN
  Administered 2022-02-24: 4 mg via INTRAVENOUS

## 2022-02-24 MED ORDER — FENTANYL CITRATE PF 50 MCG/ML IJ SOSY
50.0000 ug | PREFILLED_SYRINGE | Freq: Once | INTRAMUSCULAR | Status: AC
  Administered 2022-02-24: 12:00:00 25 ug via INTRAVENOUS
  Filled 2022-02-24: qty 2

## 2022-02-24 MED ORDER — ISOPROPYL ALCOHOL 70 % SOLN
Status: DC | PRN
Start: 2022-02-24 — End: 2022-02-24
  Administered 2022-02-24: 1 via TOPICAL

## 2022-02-24 MED ORDER — PHENYLEPHRINE HCL-NACL 20-0.9 MG/250ML-% IV SOLN
INTRAVENOUS | Status: DC | PRN
  Administered 2022-02-24: 25 ug/min via INTRAVENOUS

## 2022-02-24 MED ORDER — LIDOCAINE 2% (20 MG/ML) 5 ML SYRINGE
INTRAMUSCULAR | Status: DC | PRN
  Administered 2022-02-24: 60 mg via INTRAVENOUS

## 2022-02-24 MED ORDER — ROSUVASTATIN CALCIUM 20 MG PO TABS: 20.0000 mg | ORAL_TABLET | Freq: Every day | ORAL | Status: DC

## 2022-02-24 MED ORDER — ALBUTEROL SULFATE (2.5 MG/3ML) 0.083% IN NEBU: 2.5000 mg | INHALATION_SOLUTION | Freq: Four times a day (QID) | RESPIRATORY_TRACT | Status: DC | PRN

## 2022-02-24 MED ORDER — SODIUM CHLORIDE (PF) 0.9 % IJ SOLN
INTRAMUSCULAR | Status: DC | PRN
  Administered 2022-02-24: 30 mL

## 2022-02-24 MED ORDER — OXYCODONE HCL 5 MG/5ML PO SOLN: 5.0000 mg | Freq: Once | ORAL | Status: DC | PRN

## 2022-02-24 MED ORDER — BUPIVACAINE-EPINEPHRINE (PF) 0.25% -1:200000 IJ SOLN
INTRAMUSCULAR | Status: AC
  Filled 2022-02-24: qty 30

## 2022-02-24 MED ORDER — TRANEXAMIC ACID-NACL 1000-0.7 MG/100ML-% IV SOLN
1000.0000 mg | INTRAVENOUS | Status: AC
  Administered 2022-02-24: 15:00:00 1000 mg via INTRAVENOUS
  Filled 2022-02-24: qty 100

## 2022-02-24 MED ORDER — SODIUM CHLORIDE 0.9 % IV SOLN: INTRAVENOUS | Status: DC

## 2022-02-24 MED ORDER — ORAL CARE MOUTH RINSE: 15.0000 mL | Freq: Once | OROMUCOSAL | Status: AC

## 2022-02-24 MED ORDER — KETOROLAC TROMETHAMINE 15 MG/ML IJ SOLN
7.5000 mg | Freq: Four times a day (QID) | INTRAMUSCULAR | Status: AC
  Administered 2022-02-24 – 2022-02-25 (×3): 7.5 mg via INTRAVENOUS
  Filled 2022-02-24 (×3): qty 1

## 2022-02-24 MED ORDER — PROPOFOL 500 MG/50ML IV EMUL
INTRAVENOUS | Status: DC | PRN
  Administered 2022-02-24: 75 ug/kg/min via INTRAVENOUS

## 2022-02-24 MED ORDER — CHLORHEXIDINE GLUCONATE 0.12 % MT SOLN
15.0000 mL | Freq: Once | OROMUCOSAL | Status: AC
  Administered 2022-02-24: 12:00:00 15 mL via OROMUCOSAL

## 2022-02-24 MED ORDER — LACTATED RINGERS IV SOLN: INTRAVENOUS | Status: DC

## 2022-02-24 MED ORDER — METOCLOPRAMIDE HCL 5 MG PO TABS: 5.0000 mg | ORAL_TABLET | Freq: Three times a day (TID) | ORAL | Status: DC | PRN

## 2022-02-24 MED ORDER — DEXAMETHASONE SODIUM PHOSPHATE 10 MG/ML IJ SOLN
10.0000 mg | Freq: Once | INTRAMUSCULAR | Status: AC
  Administered 2022-02-25: 10 mg via INTRAVENOUS
  Filled 2022-02-24: qty 1

## 2022-02-24 MED ORDER — METHOCARBAMOL 500 MG IVPB - SIMPLE MED
500.0000 mg | Freq: Four times a day (QID) | INTRAVENOUS | Status: DC | PRN
  Filled 2022-02-24: qty 50

## 2022-02-24 MED ORDER — HYDROCODONE-ACETAMINOPHEN 5-325 MG PO TABS: 1.0000 | ORAL_TABLET | ORAL | Status: DC | PRN

## 2022-02-24 MED ORDER — CEFAZOLIN SODIUM-DEXTROSE 2-4 GM/100ML-% IV SOLN
2.0000 g | Freq: Four times a day (QID) | INTRAVENOUS | Status: AC
  Administered 2022-02-24 – 2022-02-25 (×2): 2 g via INTRAVENOUS
  Filled 2022-02-24 (×2): qty 100

## 2022-02-24 MED ORDER — FLUTICASONE PROPIONATE 50 MCG/ACT NA SUSP
1.0000 | Freq: Every day | NASAL | Status: DC
  Administered 2022-02-24 – 2022-02-25 (×2): 1 via NASAL
  Filled 2022-02-24: qty 16

## 2022-02-24 MED ORDER — CEFAZOLIN SODIUM-DEXTROSE 2-4 GM/100ML-% IV SOLN
2.0000 g | INTRAVENOUS | Status: AC
  Administered 2022-02-24: 15:00:00 2 g via INTRAVENOUS
  Filled 2022-02-24: qty 100

## 2022-02-24 MED ORDER — ONDANSETRON HCL 4 MG/2ML IJ SOLN
4.0000 mg | Freq: Four times a day (QID) | INTRAMUSCULAR | Status: DC | PRN
  Administered 2022-02-25: 4 mg via INTRAVENOUS
  Filled 2022-02-24: qty 2

## 2022-02-24 MED ORDER — METHOCARBAMOL 500 MG PO TABS
500.0000 mg | ORAL_TABLET | Freq: Four times a day (QID) | ORAL | Status: DC | PRN
  Administered 2022-02-24 – 2022-02-26 (×3): 500 mg via ORAL
  Filled 2022-02-24 (×3): qty 1

## 2022-02-24 MED ORDER — POLYETHYLENE GLYCOL 3350 17 G PO PACK
17.0000 g | PACK | Freq: Every day | ORAL | Status: DC | PRN
  Administered 2022-02-26: 17 g via ORAL
  Filled 2022-02-24: qty 1

## 2022-02-24 MED ORDER — DOCUSATE SODIUM 100 MG PO CAPS
100.0000 mg | ORAL_CAPSULE | Freq: Two times a day (BID) | ORAL | Status: DC
  Administered 2022-02-24 – 2022-02-26 (×4): 100 mg via ORAL
  Filled 2022-02-24 (×4): qty 1

## 2022-02-24 MED ORDER — DIPHENHYDRAMINE HCL 12.5 MG/5ML PO ELIX
12.5000 mg | ORAL_SOLUTION | ORAL | Status: DC | PRN
  Administered 2022-02-24: 23:00:00 25 mg via ORAL

## 2022-02-24 MED ORDER — DULOXETINE HCL 30 MG PO CPEP
30.0000 mg | ORAL_CAPSULE | Freq: Every day | ORAL | Status: DC
  Administered 2022-02-24 – 2022-02-26 (×3): 30 mg via ORAL
  Filled 2022-02-24 (×3): qty 1

## 2022-02-24 MED ORDER — POVIDONE-IODINE 10 % EX SWAB
2.0000 | Freq: Once | CUTANEOUS | Status: AC
Start: 2022-02-24 — End: 2022-02-24
  Administered 2022-02-24: 2 via TOPICAL

## 2022-02-24 MED ORDER — ACETAMINOPHEN 325 MG PO TABS: 325.0000 mg | ORAL_TABLET | Freq: Four times a day (QID) | ORAL | Status: DC | PRN

## 2022-02-24 MED ORDER — ACETAMINOPHEN 500 MG PO TABS
1000.0000 mg | ORAL_TABLET | Freq: Once | ORAL | Status: AC
  Administered 2022-02-24: 12:00:00 1000 mg via ORAL
  Filled 2022-02-24: qty 2

## 2022-02-24 MED ORDER — KETOROLAC TROMETHAMINE 30 MG/ML IJ SOLN
INTRAMUSCULAR | Status: DC | PRN
  Administered 2022-02-24: 30 mg via INTRAVENOUS

## 2022-02-24 MED ORDER — ROPIVACAINE HCL 5 MG/ML IJ SOLN
INTRAMUSCULAR | Status: DC | PRN
  Administered 2022-02-24: 25 mL via PERINEURAL

## 2022-02-24 MED ORDER — BUPIVACAINE IN DEXTROSE 0.75-8.25 % IT SOLN
INTRATHECAL | Status: DC | PRN
Start: 2022-02-24 — End: 2022-02-24
  Administered 2022-02-24: 1.7 mL via INTRATHECAL

## 2022-02-24 MED ORDER — ALUM & MAG HYDROXIDE-SIMETH 200-200-20 MG/5ML PO SUSP: 30.0000 mL | ORAL | Status: DC | PRN

## 2022-02-24 MED ORDER — PANTOPRAZOLE SODIUM 40 MG PO TBEC
80.0000 mg | DELAYED_RELEASE_TABLET | Freq: Every day | ORAL | Status: DC
  Administered 2022-02-24 – 2022-02-26 (×3): 80 mg via ORAL
  Filled 2022-02-24 (×3): qty 2

## 2022-02-24 MED ORDER — MENTHOL 3 MG MT LOZG: 1.0000 | LOZENGE | OROMUCOSAL | Status: DC | PRN

## 2022-02-24 MED ORDER — SODIUM CHLORIDE 0.9 % IR SOLN
Status: DC | PRN
  Administered 2022-02-24 (×3): 1000 mL

## 2022-02-24 MED ORDER — EPHEDRINE SULFATE-NACL 50-0.9 MG/10ML-% IV SOSY
PREFILLED_SYRINGE | INTRAVENOUS | Status: DC | PRN
  Administered 2022-02-24: 5 mg via INTRAVENOUS

## 2022-02-24 MED ORDER — POVIDONE-IODINE 10 % EX SWAB
2.0000 | Freq: Once | CUTANEOUS | Status: DC
Start: 2022-02-24 — End: 2022-02-24

## 2022-02-24 MED ORDER — ONDANSETRON HCL 4 MG PO TABS: 4.0000 mg | ORAL_TABLET | Freq: Four times a day (QID) | ORAL | Status: DC | PRN

## 2022-02-24 MED ORDER — SENNA 8.6 MG PO TABS
1.0000 | ORAL_TABLET | Freq: Two times a day (BID) | ORAL | Status: DC
  Administered 2022-02-24 – 2022-02-26 (×4): 8.6 mg via ORAL
  Filled 2022-02-24 (×4): qty 1

## 2022-02-24 MED ORDER — FENTANYL CITRATE PF 50 MCG/ML IJ SOSY: 25.0000 ug | PREFILLED_SYRINGE | INTRAMUSCULAR | Status: DC | PRN

## 2022-02-24 MED ORDER — ROSUVASTATIN CALCIUM 20 MG PO TABS
40.0000 mg | ORAL_TABLET | Freq: Every day | ORAL | Status: DC
  Administered 2022-02-24 – 2022-02-25 (×2): 40 mg via ORAL
  Filled 2022-02-24 (×2): qty 2

## 2022-02-24 MED ORDER — OXYCODONE HCL 5 MG PO TABS: 5.0000 mg | ORAL_TABLET | Freq: Once | ORAL | Status: DC | PRN

## 2022-02-24 MED ORDER — HYDROCODONE-ACETAMINOPHEN 7.5-325 MG PO TABS
1.0000 | ORAL_TABLET | ORAL | Status: DC | PRN
  Administered 2022-02-24: 20:00:00 1 via ORAL
  Administered 2022-02-24 – 2022-02-25 (×3): 2 via ORAL
  Filled 2022-02-24: qty 1
  Filled 2022-02-24 (×4): qty 2

## 2022-02-24 MED ORDER — ACETAMINOPHEN 325 MG PO TABS: 325.0000 mg | ORAL_TABLET | ORAL | Status: DC | PRN

## 2022-02-24 MED ORDER — METOCLOPRAMIDE HCL 5 MG/ML IJ SOLN: 5.0000 mg | Freq: Three times a day (TID) | INTRAMUSCULAR | Status: DC | PRN

## 2022-02-24 MED ORDER — DEXAMETHASONE SODIUM PHOSPHATE 10 MG/ML IJ SOLN
INTRAMUSCULAR | Status: DC | PRN
Start: 2022-02-24 — End: 2022-02-24
  Administered 2022-02-24: 4 mg via INTRAVENOUS

## 2022-02-24 MED ORDER — AMISULPRIDE (ANTIEMETIC) 5 MG/2ML IV SOLN: 10.0000 mg | Freq: Once | INTRAVENOUS | Status: DC | PRN

## 2022-02-24 SURGICAL SUPPLY — 72 items
ARTICULAR SURFACE  FIXED  BEARING  CRUCIATE RETAIN (Orthopedic Implant) ×2 IMPLANT
BAG COUNTER SPONGE SURGICOUNT (BAG) ×2 IMPLANT
BAG SURGICOUNT SPONGE COUNTING (BAG) ×2
BAG ZIPLOCK 12X15 (MISCELLANEOUS) ×2 IMPLANT
BATTERY INSTRU NAVIGATION (MISCELLANEOUS) ×9 IMPLANT
BLADE SAW RECIPROCATING 77.5 (BLADE) ×3 IMPLANT
BNDG ELASTIC 4X5.8 VLCR STR LF (GAUZE/BANDAGES/DRESSINGS) ×3 IMPLANT
BNDG ELASTIC 6X5.8 VLCR STR LF (GAUZE/BANDAGES/DRESSINGS) ×3 IMPLANT
CHLORAPREP W/TINT 26 (MISCELLANEOUS) ×6 IMPLANT
COMP FEM PS STD 4 RT (Joint) ×3 IMPLANT
COMPONENT FEM PS STD 4 RT (Joint) IMPLANT
COVER SURGICAL LIGHT HANDLE (MISCELLANEOUS) ×3 IMPLANT
DERMABOND ADVANCED (GAUZE/BANDAGES/DRESSINGS) ×4
DERMABOND ADVANCED .7 DNX12 (GAUZE/BANDAGES/DRESSINGS) ×2 IMPLANT
DRAPE INCISE IOBAN 66X45 STRL (DRAPES) ×3 IMPLANT
DRAPE SHEET LG 3/4 BI-LAMINATE (DRAPES) ×9 IMPLANT
DRAPE U-SHAPE 47X51 STRL (DRAPES) ×3 IMPLANT
DRSG AQUACEL AG ADV 3.5X10 (GAUZE/BANDAGES/DRESSINGS) ×2 IMPLANT
DRSG AQUACEL AG ADV 3.5X14 (GAUZE/BANDAGES/DRESSINGS) ×3 IMPLANT
ELECT BLADE TIP CTD 4 INCH (ELECTRODE) ×3 IMPLANT
ELECT REM PT RETURN 15FT ADLT (MISCELLANEOUS) ×3 IMPLANT
GAUZE SPONGE 4X4 12PLY STRL (GAUZE/BANDAGES/DRESSINGS) ×3 IMPLANT
GLOVE SRG 8 PF TXTR STRL LF DI (GLOVE) ×2 IMPLANT
GLOVE SURG ENC MOIS LTX SZ8.5 (GLOVE) ×6 IMPLANT
GLOVE SURG ENC TEXT LTX SZ7.5 (GLOVE) ×9 IMPLANT
GLOVE SURG UNDER POLY LF SZ8 (GLOVE) ×6
GLOVE SURG UNDER POLY LF SZ8.5 (GLOVE) ×3 IMPLANT
GOWN SPEC L3 XXLG W/TWL (GOWN DISPOSABLE) ×3 IMPLANT
GOWN STRL REUS W/TWL XL LVL3 (GOWN DISPOSABLE) ×3 IMPLANT
HANDPIECE INTERPULSE COAX TIP (DISPOSABLE) ×3
HDLS TROCR DRIL PIN KNEE 75 (PIN) ×6
HOLDER FOLEY CATH W/STRAP (MISCELLANEOUS) ×3 IMPLANT
HOOD PEEL AWAY FLYTE STAYCOOL (MISCELLANEOUS) ×9 IMPLANT
IMPL PATELLA METAL SZ32X10 (Joint) ×2 IMPLANT
JET LAVAGE IRRISEPT WOUND (IRRIGATION / IRRIGATOR)
KIT TURNOVER KIT A (KITS) IMPLANT
LAVAGE JET IRRISEPT WOUND (IRRIGATION / IRRIGATOR) IMPLANT
LINER TIB ASF PS CD/3-9 10 RT (Liner) ×2 IMPLANT
MARKER SKIN DUAL TIP RULER LAB (MISCELLANEOUS) ×3 IMPLANT
NDL SAFETY ECLIPSE 18X1.5 (NEEDLE) ×1 IMPLANT
NDL SPNL 18GX3.5 QUINCKE PK (NEEDLE) ×1 IMPLANT
NEEDLE HYPO 18GX1.5 SHARP (NEEDLE) ×3
NEEDLE SPNL 18GX3.5 QUINCKE PK (NEEDLE) ×3 IMPLANT
NS IRRIG 1000ML POUR BTL (IV SOLUTION) ×3 IMPLANT
O DEGREE SPIKED KEEL  RIGHT  SIZE D  OSSEO  TIBIA (Orthopedic Implant) ×2 IMPLANT
PACK TOTAL KNEE CUSTOM (KITS) ×3 IMPLANT
PADDING CAST COTTON 6X4 STRL (CAST SUPPLIES) ×3 IMPLANT
PIN DRILL HDLS TROCAR 75 4PK (PIN) IMPLANT
PROTECTOR NERVE ULNAR (MISCELLANEOUS) ×3 IMPLANT
SAW OSC TIP CART 19.5X105X1.3 (SAW) ×3 IMPLANT
SCREW FEMALE HEX FIX 25X2.5 (ORTHOPEDIC DISPOSABLE SUPPLIES) ×2 IMPLANT
SEALER BIPOLAR AQUA 6.0 (INSTRUMENTS) ×3 IMPLANT
SET HNDPC FAN SPRY TIP SCT (DISPOSABLE) ×1 IMPLANT
SET PAD KNEE POSITIONER (MISCELLANEOUS) ×3 IMPLANT
SPIKE FLUID TRANSFER (MISCELLANEOUS) ×2 IMPLANT
SPONGE T-LAP 18X18 ~~LOC~~+RFID (SPONGE) ×3 IMPLANT
STEM TIB PS KNEE D 0D RT (Stem) ×2 IMPLANT
SUT MNCRL AB 3-0 PS2 18 (SUTURE) ×3 IMPLANT
SUT MNCRL AB 4-0 PS2 18 (SUTURE) ×3 IMPLANT
SUT MON AB 2-0 CT1 36 (SUTURE) ×3 IMPLANT
SUT STRATAFIX PDO 1 14 VIOLET (SUTURE) ×3
SUT STRATFX PDO 1 14 VIOLET (SUTURE) ×1
SUT VIC AB 1 CTX 36 (SUTURE) ×6
SUT VIC AB 1 CTX36XBRD ANBCTR (SUTURE) ×2 IMPLANT
SUT VIC AB 2-0 CT1 27 (SUTURE) ×3
SUT VIC AB 2-0 CT1 TAPERPNT 27 (SUTURE) ×1 IMPLANT
SUTURE STRATFX PDO 1 14 VIOLET (SUTURE) ×1 IMPLANT
TRAY FOLEY MTR SLVR 16FR STAT (SET/KITS/TRAYS/PACK) ×2 IMPLANT
TUBE SUCTION HIGH CAP CLEAR NV (SUCTIONS) ×3 IMPLANT
WATER STERILE IRR 1000ML POUR (IV SOLUTION) ×6 IMPLANT
WRAP KNEE MAXI GEL POST OP (GAUZE/BANDAGES/DRESSINGS) ×2 IMPLANT
cruciate retaining (cr) right  size 4 (Orthopedic Implant) ×2 IMPLANT

## 2022-02-24 NOTE — Op Note (Signed)
OPERATIVE REPORT ? ?SURGEON: Rod Can, MD  ? ?ASSISTANT: Nehemiah Massed, PA-C ? ?PREOPERATIVE DIAGNOSIS: Primary Right knee arthritis.  ? ?POSTOPERATIVE DIAGNOSIS: Primary Right knee arthritis.  ? ?PROCEDURE: Computer assisted Right total knee arthroplasty.  ? ?IMPLANTS: Zimmer Persona PPS Cementless CR femur, size 4. ?Persona 0 degree Spiked Keel OsseoTi Tibia, size D. ?Vivacit-E polyethelyene insert, size 10 mm, CR. ?TM standard patella, size 32 mm. ? ?ANESTHESIA:  MAC, Regional, and Spinal ? ?TOURNIQUET TIME: Not utilized.  ? ?ESTIMATED BLOOD LOSS:-250 mL   ? ?ANTIBIOTICS: 2g Ancef. ? ?DRAINS: None. ? ?COMPLICATIONS: None ?  ?CONDITION: PACU - hemodynamically stable.  ? ?BRIEF CLINICAL NOTE: Colleen Medina is a 81 y.o. female with a long-standing history of Right knee arthritis. After failing conservative management, the patient was indicated for total knee arthroplasty. The risks, benefits, and alternatives to the procedure were explained, and the patient elected to proceed. ? ?PROCEDURE IN DETAIL: Adductor canal block was obtained in the pre-op holding area. Once inside the operative room, spinal anesthesia was obtained, and a foley catheter was inserted. The patient was then positioned and the lower extremity was prepped and draped in the normal sterile surgical fashion.  A time-out was called verifying side and site of surgery. The patient received IV antibiotics within 60 minutes of beginning the procedure. A tourniquet was not utilized. ?  ?An anterior approach to the knee was performed utilizing a midvastus arthrotomy. A medial release was performed and the patellar fat pad was excised. Stryker imageless navigation was used to cut the distal femur perpendicular to the mechanical axis. A freehand patellar resection was performed, and the patella was sized an prepared with 3 lug holes. ? ?Nagivation was used to make a neutral proximal tibia resection, taking 9 mm of bone from the less affected  lateral side with 3 degrees of slope. The menisci were excised. A spacer block was placed, and the alignment and balance in extension were confirmed.  ? ?The distal femur was sized using the 3-degree external rotation guide referencing the posterior femoral cortex. The appropriate 4-in-1 cutting block was pinned into place. Rotation was checked using Whiteside's line, the epicondylar axis, and then confirmed with a spacer block in flexion. The remaining femoral cuts were performed, taking care to protect the MCL. ? ?The tibia was sized and the trial tray was pinned into place. The remaining trail components were inserted. The knee was stable to varus and valgus stress through a full range of motion. The patella tracked centrally, and the PCL was well balanced. The trial components were removed, and the proximal tibial surface was prepared. Final components were impacted into place. The knee was tested for a final time and found to be well balanced. ?  ?The wound was copiously irrigated with Irrisept solution and normal saline using pule lavage.  Marcaine solution was injected into the periarticular soft tissue.  The wound was closed in layers using #1 Vicryl and Stratafix for the fascia, 2-0 Vicryl for the subcutaneous fat, 2-0 Monocryl for the deep dermal layer, 3-0 running Monocryl subcuticular Stitch, and 4-0 Monocryl stay sutures at both ends of the wound. Dermabond was applied to the skin.  Once the glue was fully dried, an Aquacell Ag and compressive dressing were applied.  The patient was transported to the recovery room in stable condition.  Sponge, needle, and instrument counts were correct at the end of the case x2.  The patient tolerated the procedure well and there were no known complications. ? ?  Please note that a surgical assistant was a medical necessity for this procedure in order to perform it in a safe and expeditious manner. Surgical assistant was necessary to retract the ligaments and vital  neurovascular structures to prevent injury to them and also necessary for proper positioning of the limb to allow for anatomic placement of the prosthesis. ?

## 2022-02-24 NOTE — Progress Notes (Signed)
Assisted Dr. Hollis with right, ultrasound guided, adductor canal block. Side rails up, monitors on throughout procedure. See vital signs in flow sheet. Tolerated Procedure well.  

## 2022-02-24 NOTE — Interval H&P Note (Signed)
History and Physical Interval Note: ? ?02/24/2022 ?1:02 PM ? ?Colleen Medina  has presented today for surgery, with the diagnosis of Right knee osteoarthritis.  The various methods of treatment have been discussed with the patient and family. After consideration of risks, benefits and other options for treatment, the patient has consented to  Procedure(s) with comments: ?COMPUTER ASSISTED TOTAL KNEE ARTHROPLASTY (Right) - 150 as a surgical intervention.  The patient's history has been reviewed, patient examined, no change in status, stable for surgery.  I have reviewed the patient's chart and labs.  Questions were answered to the patient's satisfaction.   ? ? ?Hilton Cork Augustin Bun ? ? ?

## 2022-02-24 NOTE — Discharge Instructions (Signed)
 Dr. Osiel Stick Total Joint Specialist [facility] [address] [place], [place] [address] [phone]  TOTAL KNEE REPLACEMENT POSTOPERATIVE DIRECTIONS    Knee Rehabilitation, Guidelines Following Surgery  Results after knee surgery are often greatly improved when you follow the exercise, range of motion and muscle strengthening exercises prescribed by your doctor. Safety measures are also important to protect the knee from further injury. Any time any of these exercises cause you to have increased pain or swelling in your knee joint, decrease the amount until you are comfortable again and slowly increase them. If you have problems or questions, call your caregiver or physical therapist for advice.   WEIGHT BEARING Weight bearing as tolerated with assist device (walker, cane, etc) as directed, use it as long as suggested by your surgeon or therapist, typically at least 4-6 weeks.  HOME CARE INSTRUCTIONS  Remove items at home which could result in a fall. This includes throw rugs or furniture in walking pathways.  Continue medications as instructed at time of discharge. You may have some home medications which will be placed on hold until you complete the course of blood thinner medication.  You may start showering once you are discharged home but do not submerge the incision under water. Just pat the incision dry and apply a dry gauze dressing on daily. Walk with walker as instructed.  You may resume a sexual relationship in one month or when given the OK by your doctor.  Use walker as long as suggested by your caregivers. Avoid periods of inactivity such as sitting longer than an hour when not asleep. This helps prevent blood clots.  You may put full weight on your legs and walk as much as is comfortable.  You may return to work once you are cleared by your doctor.  Do not drive a car for 6 weeks or until released by you surgeon.  Do not drive while  taking narcotics.  Wear the elastic stockings for three weeks following surgery during the day but you may remove then at night. Make sure you keep all of your appointments after your operation with all of your doctors and caregivers. You should call the office at the above phone number and make an appointment for approximately two weeks after the date of your surgery. Do not remove your surgical dressing. The dressing is waterproof; you may take showers in 3 days, but do not take tub baths or submerge the dressing. Please pick up a stool softener and laxative for home use as long as you are requiring pain medications. ICE to the affected knee every three hours for 30 minutes at a time and then as needed for pain and swelling.  Continue to use ice on the knee for pain and swelling from surgery. You may notice swelling that will progress down to the foot and ankle.  This is normal after surgery.  Elevate the leg when you are not up walking on it.   It is important for you to complete the blood thinner medication as prescribed by your doctor. Continue to use the breathing machine which will help keep your temperature down.  It is common for your temperature to cycle up and down following surgery, especially at night when you are not up moving around and exerting yourself.  The breathing machine keeps your lungs expanded and your temperature down.  RANGE OF MOTION AND STRENGTHENING EXERCISES  Rehabilitation of the knee is important following a knee injury or an   operation. After just a few days of immobilization, the muscles of the thigh which control the knee become weakened and shrink (atrophy). Knee exercises are designed to build up the tone and strength of the thigh muscles and to improve knee motion. Often times heat used for twenty to thirty minutes before working out will loosen up your tissues and help with improving the range of motion but do not use heat for the first two weeks following surgery.  These exercises can be done on a training (exercise) mat, on the floor, on a table or on a bed. Use what ever works the best and is most comfortable for you Knee exercises include:  Leg Lifts - While your knee is still immobilized in a splint or cast, you can do straight leg raises. Lift the leg to 60 degrees, hold for 3 sec, and slowly lower the leg. Repeat 10-20 times 2-3 times daily. Perform this exercise against resistance later as your knee gets better.  Quad and Hamstring Sets - Tighten up the muscle on the front of the thigh (Quad) and hold for 5-10 sec. Repeat this 10-20 times hourly. Hamstring sets are done by pushing the foot backward against an object and holding for 5-10 sec. Repeat as with quad sets.  A rehabilitation program following serious knee injuries can speed recovery and prevent re-injury in the future due to weakened muscles. Contact your doctor or a physical therapist for more information on knee rehabilitation.   POST-OPERATIVE OPIOID TAPER INSTRUCTIONS: It is important to wean off of your opioid medication as soon as possible. If you do not need pain medication after your surgery it is ok to stop day one. Opioids include: Codeine, Hydrocodone(Norco, Vicodin), Oxycodone(Percocet, oxycontin) and hydromorphone amongst others.  Long term and even short term use of opiods can cause: Increased pain response Dependence Constipation Depression Respiratory depression And more.  Withdrawal symptoms can include Flu like symptoms Nausea, vomiting And more Techniques to manage these symptoms Hydrate well Eat regular healthy meals Stay active Use relaxation techniques(deep breathing, meditating, yoga) Do Not substitute Alcohol to help with tapering If you have been on opioids for less than two weeks and do not have pain than it is ok to stop all together.  Plan to wean off of opioids This plan should start within one week post op of your joint replacement. Maintain the same  interval or time between taking each dose and first decrease the dose.  Cut the total daily intake of opioids by one tablet each day Next start to increase the time between doses. The last dose that should be eliminated is the evening dose.    SKILLED REHAB INSTRUCTIONS: If the patient is transferred to a skilled rehab facility following release from the hospital, a list of the current medications will be sent to the facility for the patient to continue.  When discharged from the skilled rehab facility, please have the facility set up the patient's Home Health Physical Therapy prior to being released. Also, the skilled facility will be responsible for providing the patient with their medications at time of release from the facility to include their pain medication, the muscle relaxants, and their blood thinner medication. If the patient is still at the rehab facility at time of the two week follow up appointment, the skilled rehab facility will also need to assist the patient in arranging follow up appointment in our office and any transportation needs.  MAKE SURE YOU:  Understand these instructions.  Will watch   your condition.  Will get help right away if you are not doing well or get worse.    Pick up stool softner and laxative for home use following surgery while on pain medications. Do NOT remove your dressing. You may shower.  Do not take tub baths or submerge incision under water. May shower starting three days after surgery. Please use a clean towel to pat the incision dry following showers. Continue to use ice for pain and swelling after surgery. Do not use any lotions or creams on the incision until instructed by your surgeon.  

## 2022-02-24 NOTE — Plan of Care (Signed)

## 2022-02-24 NOTE — Anesthesia Procedure Notes (Signed)
Anesthesia Regional Block: Adductor canal block  ? ?Pre-Anesthetic Checklist: , timeout performed,  Correct Patient, Correct Site, Correct Laterality,  Correct Procedure, Correct Position, site marked,  Risks and benefits discussed,  Surgical consent,  Pre-op evaluation,  At surgeon's request and post-op pain management ? ?Laterality: Right ? ?Prep: chloraprep     ?  ?Needles:  ?Injection technique: Single-shot ? ?Needle Type: Echogenic Stimulator Needle   ? ? ?Needle Length: 9cm  ?Needle Gauge: 21  ? ? ? ?Additional Needles: ? ? ?Procedures:,,,, ultrasound used (permanent image in chart),,    ?Narrative:  ?Start time: 02/24/2022 12:25 PM ?End time: 02/24/2022 12:30 PM ?Injection made incrementally with aspirations every 5 mL. ? ?Performed by: Personally  ?Anesthesiologist: Effie Berkshire, MD ? ?Additional Notes: ?Patient tolerated the procedure well. Local anesthetic introduced in an incremental fashion under minimal resistance after negative aspirations. No paresthesias were elicited. After completion of the procedure, no acute issues were identified and patient continued to be monitored by RN.  ? ? ? ? ? ?

## 2022-02-24 NOTE — Transfer of Care (Signed)
Immediate Anesthesia Transfer of Care Note ? ?Patient: Colleen Medina ? ?Procedure(s) Performed: COMPUTER ASSISTED TOTAL KNEE ARTHROPLASTY (Right: Knee) ? ?Patient Location: PACU ? ?Anesthesia Type:Spinal ? ?Level of Consciousness: awake, drowsy and patient cooperative ? ?Airway & Oxygen Therapy: Patient Spontanous Breathing and Patient connected to face mask oxygen ? ?Post-op Assessment: Report given to RN and Post -op Vital signs reviewed and stable ? ?Post vital signs: Reviewed and stable ? ?Last Vitals:  ?Vitals Value Taken Time  ?BP 150/71 02/24/22 1721  ?Temp    ?Pulse 49 02/24/22 1723  ?Resp 13 02/24/22 1723  ?SpO2 100 % 02/24/22 1723  ?Vitals shown include unvalidated device data. ? ?Last Pain:  ?Vitals:  ? 02/24/22 1225  ?TempSrc:   ?PainSc: 0-No pain  ?   ? ?  ? ?Complications: No notable events documented. ?

## 2022-02-24 NOTE — Anesthesia Postprocedure Evaluation (Signed)
Anesthesia Post Note ? ?Patient: Colleen Medina ? ?Procedure(s) Performed: COMPUTER ASSISTED TOTAL KNEE ARTHROPLASTY (Right: Knee) ? ?  ? ?Patient location during evaluation: PACU ?Anesthesia Type: Spinal ?Level of consciousness: oriented and awake and alert ?Pain management: pain level controlled ?Vital Signs Assessment: post-procedure vital signs reviewed and stable ?Respiratory status: spontaneous breathing, respiratory function stable and patient connected to nasal cannula oxygen ?Cardiovascular status: blood pressure returned to baseline and stable ?Postop Assessment: no headache, no backache, no apparent nausea or vomiting and spinal receding ?Anesthetic complications: no ? ? ?No notable events documented. ? ?Last Vitals:  ?Vitals:  ? 02/24/22 1815 02/24/22 1842  ?BP: (!) 128/94 (!) 147/77  ?Pulse: (!) 55 64  ?Resp: 16 19  ?Temp: (!) 36.3 ?C 36.4 ?C  ?SpO2: 98% 100%  ?  ?Last Pain:  ?Vitals:  ? 02/24/22 1842  ?TempSrc: Oral  ?PainSc: 0-No pain  ? ? ?  ?  ?  ?  ?  ?  ? ?Effie Berkshire ? ? ? ? ?

## 2022-02-24 NOTE — Anesthesia Procedure Notes (Signed)
Spinal ? ?Patient location during procedure: OR ?Start time: 02/24/2022 2:42 PM ?End time: 02/24/2022 2:44 PM ?Reason for block: surgical anesthesia ?Staffing ?Performed: resident/CRNA  ?Anesthesiologist: Effie Berkshire, MD ?Resident/CRNA: Raenette Rover, CRNA ?Preanesthetic Checklist ?Completed: patient identified, IV checked, site marked, risks and benefits discussed, surgical consent, monitors and equipment checked, pre-op evaluation and timeout performed ?Spinal Block ?Patient position: sitting ?Prep: DuraPrep ?Patient monitoring: blood pressure, continuous pulse ox and heart rate ?Approach: midline ?Location: L3-4 ?Injection technique: single-shot ?Needle ?Needle type: Pencan  ?Needle gauge: 24 G ?Assessment ?Sensory level: T6 ?Events: CSF return ? ? ? ?

## 2022-02-24 NOTE — Anesthesia Preprocedure Evaluation (Addendum)
Anesthesia Evaluation  ?Patient identified by MRN, date of birth, ID band ?Patient awake ? ? ? ?Reviewed: ?Allergy & Precautions, NPO status , Patient's Chart, lab work & pertinent test results ? ?Airway ?Mallampati: I ? ?TM Distance: >3 FB ?Neck ROM: Full ? ? ? Dental ?  ?Pulmonary ?asthma ,  ?  ?breath sounds clear to auscultation ? ? ? ? ? ? Cardiovascular ?hypertension, Pt. on medications ? ?Rhythm:Regular Rate:Bradycardia ? ?Echo: ?1. Left ventricular ejection fraction by 3D volume is 58 %. The left  ?ventricle has normal function. The left ventricle has no regional wall  ?motion abnormalities. Left ventricular diastolic parameters are consistent  ?with Grade I diastolic dysfunction  ?(impaired relaxation).  ??2. Right ventricular systolic function is normal. The right ventricular  ?size is normal. There is normal pulmonary artery systolic pressure.  ??3. The mitral valve is normal in structure. Mild mitral valve  ?regurgitation. No evidence of mitral stenosis.  ??4. Tricuspid valve regurgitation is moderate.  ??5. The aortic valve is normal in structure. Aortic valve regurgitation is  ?mild. No aortic stenosis is present.  ??6. Pulmonic valve regurgitation is moderate.  ??7. The inferior vena cava is normal in size with greater than 50%  ?respiratory variability, suggesting right atrial pressure of 3 mmHg.  ?  ?Neuro/Psych ? Headaches, PSYCHIATRIC DISORDERS Depression   ? GI/Hepatic ?Neg liver ROS, GERD  Medicated,  ?Endo/Other  ?diabetes, Type 2 ? Renal/GU ?negative Renal ROS  ? ?  ?Musculoskeletal ? ?(+) Arthritis , Fibromyalgia - ? Abdominal ?Normal abdominal exam  (+)   ?Peds ? Hematology ?negative hematology ROS ?(+)   ?Anesthesia Other Findings ? ? Reproductive/Obstetrics ? ?  ? ? ? ? ? ? ? ? ? ? ? ? ? ?  ?  ? ? ? ? ? ? ? ?Anesthesia Physical ?Anesthesia Plan ? ?ASA: 3 ? ?Anesthesia Plan: Spinal  ? ?Post-op Pain Management:   ? ?Induction: Intravenous ? ?PONV Risk Score  and Plan: 4 or greater and Ondansetron, Treatment may vary due to age or medical condition, Propofol infusion and Dexamethasone ? ?Airway Management Planned: Natural Airway and Simple Face Mask ? ?Additional Equipment: None ? ?Intra-op Plan:  ? ?Post-operative Plan:  ? ?Informed Consent: I have reviewed the patients History and Physical, chart, labs and discussed the procedure including the risks, benefits and alternatives for the proposed anesthesia with the patient or authorized representative who has indicated his/her understanding and acceptance.  ? ? ? ? ? ?Plan Discussed with: CRNA ? ?Anesthesia Plan Comments: (Lab Results ?     Component                Value               Date                 ?     WBC                      4.2                 02/11/2022           ?     HGB                      9.7 (L)             02/11/2022           ?  HCT                      33.1 (L)            02/11/2022           ?     MCV                      80.7                02/11/2022           ?     PLT                      243                 02/11/2022           ?)  ? ? ? ? ? ?Anesthesia Quick Evaluation ? ?

## 2022-02-25 DIAGNOSIS — M1711 Unilateral primary osteoarthritis, right knee: Secondary | ICD-10-CM | POA: Diagnosis not present

## 2022-02-25 LAB — BASIC METABOLIC PANEL
Anion gap: 9 (ref 5–15)
BUN: 17 mg/dL (ref 8–23)
CO2: 23 mmol/L (ref 22–32)
Calcium: 8.2 mg/dL — ABNORMAL LOW (ref 8.9–10.3)
Chloride: 101 mmol/L (ref 98–111)
Creatinine, Ser: 1.1 mg/dL — ABNORMAL HIGH (ref 0.44–1.00)
GFR, Estimated: 51 mL/min — ABNORMAL LOW (ref >60)
Glucose, Bld: 208 mg/dL — ABNORMAL HIGH (ref 70–99)
Potassium: 4.1 mmol/L (ref 3.5–5.1)
Sodium: 133 mmol/L — ABNORMAL LOW (ref 135–145)

## 2022-02-25 LAB — CBC
HCT: 29.8 % — ABNORMAL LOW (ref 36.0–46.0)
Hemoglobin: 8.7 g/dL — ABNORMAL LOW (ref 12.0–15.0)
MCH: 23.8 pg — ABNORMAL LOW (ref 26.0–34.0)
MCHC: 29.2 g/dL — ABNORMAL LOW (ref 30.0–36.0)
MCV: 81.4 fL (ref 80.0–100.0)
Platelets: 212 10*3/uL (ref 150–400)
RBC: 3.66 MIL/uL — ABNORMAL LOW (ref 3.87–5.11)
RDW: 14.9 % (ref 11.5–15.5)
WBC: 8.3 10*3/uL (ref 4.0–10.5)
nRBC: 0 % (ref 0.0–0.2)

## 2022-02-25 MED ORDER — POLYETHYLENE GLYCOL 3350 17 G PO PACK: 17.0000 g | PACK | Freq: Every day | ORAL | 0 refills | Status: DC | PRN

## 2022-02-25 MED ORDER — OXYCODONE-ACETAMINOPHEN 5-325 MG PO TABS
1.0000 | ORAL_TABLET | ORAL | Status: DC | PRN
  Administered 2022-02-25: 1 via ORAL
  Administered 2022-02-25 – 2022-02-26 (×4): 2 via ORAL
  Filled 2022-02-25 (×2): qty 2
  Filled 2022-02-25: qty 1
  Filled 2022-02-25 (×2): qty 2

## 2022-02-25 MED ORDER — DOCUSATE SODIUM 100 MG PO CAPS: 100.0000 mg | ORAL_CAPSULE | Freq: Two times a day (BID) | ORAL | 0 refills | Status: DC

## 2022-02-25 MED ORDER — SENNA 8.6 MG PO TABS: 2.0000 | ORAL_TABLET | Freq: Every day | ORAL | 0 refills | Status: DC

## 2022-02-25 MED ORDER — METHOCARBAMOL 500 MG PO TABS
500.0000 mg | ORAL_TABLET | Freq: Four times a day (QID) | ORAL | 0 refills | Status: DC | PRN
Start: 2022-02-25 — End: 2022-11-07

## 2022-02-25 MED ORDER — OXYCODONE-ACETAMINOPHEN 5-325 MG PO TABS: 1.0000 | ORAL_TABLET | ORAL | 0 refills | Status: DC | PRN

## 2022-02-25 MED ORDER — ONDANSETRON HCL 4 MG PO TABS: 4.0000 mg | ORAL_TABLET | Freq: Four times a day (QID) | ORAL | 0 refills | Status: DC | PRN

## 2022-02-25 MED ORDER — ASPIRIN 81 MG PO CHEW: 81.0000 mg | CHEWABLE_TABLET | Freq: Two times a day (BID) | ORAL | 0 refills | Status: DC

## 2022-02-25 NOTE — Progress Notes (Signed)
? ? ?  Subjective: ? ?Patient reports pain as moderate to severe.  Denies N/V/CP/SOB. States hydrocodone is not controlling pain. ? ?Objective:  ? ?VITALS:   ?Vitals:  ? 02/24/22 2038 02/25/22 0244 02/25/22 0621 02/25/22 1100  ?BP: (!) 161/86 (!) 152/80 121/73 131/62  ?Pulse: 87 82 64 66  ?Resp: $Remov'16 18 16 16  'FCHFaq$ ?Temp: 97.8 ?F (36.6 ?C) 98 ?F (36.7 ?C) 97.6 ?F (36.4 ?C) 97.7 ?F (36.5 ?C)  ?TempSrc: Oral Oral Oral Oral  ?SpO2: 97% 100% 98% 97%  ?Weight:      ?Height:      ? ? ?NAD ?ABD soft ?Sensation intact distally ?Intact pulses distally ?Dorsiflexion/Plantar flexion intact ?Incision: dressing C/D/I ?Compartment soft ?Able to SLR ? ?Lab Results  ?Component Value Date  ? WBC 8.3 02/25/2022  ? HGB 8.7 (L) 02/25/2022  ? HCT 29.8 (L) 02/25/2022  ? MCV 81.4 02/25/2022  ? PLT 212 02/25/2022  ? ?BMET ?   ?Component Value Date/Time  ? NA 133 (L) 02/25/2022 0326  ? NA 140 02/22/2021 1501  ? K 4.1 02/25/2022 0326  ? CL 101 02/25/2022 0326  ? CO2 23 02/25/2022 0326  ? GLUCOSE 208 (H) 02/25/2022 0326  ? BUN 17 02/25/2022 0326  ? BUN 23 02/22/2021 1501  ? CREATININE 1.10 (H) 02/25/2022 0326  ? CREATININE 0.79 10/30/2014 1608  ? CALCIUM 8.2 (L) 02/25/2022 0326  ? EGFR 55 (L) 02/22/2021 1501  ? GFRNONAA 51 (L) 02/25/2022 0326  ? GFRNONAA 76 10/30/2014 1608  ? ? ? ?Assessment/Plan: ?1 Day Post-Op  ? ?Principal Problem: ?  Osteoarthritis of right knee ? ? ?WBAT with walker ?DVT ppx: Aspirin, SCDs, TEDS ?PO pain control: d/c hydrocodone, start percocet ?PT/OT ?Dispo: D/C home w OPPT once pain is controlled and clears therapy ? ? ?Hilton Cork Keeley Sussman ?02/25/2022, 1:12 PM ? ? ?Rod Can, MD ?[phone] ?[facility] ?[address], [address], [place], [place] [address] ?Phone: [phone] ?[email or web address] ?Facebook  Engineer, structural  ?  ? ? ?

## 2022-02-25 NOTE — Plan of Care (Signed)
?  Problem: Education: ?Goal: Knowledge of the prescribed therapeutic regimen will improve ?Outcome: Progressing ?  ?Problem: Clinical Measurements: ?Goal: Postoperative complications will be avoided or minimized ?Outcome: Progressing ?  ?Problem: Pain Management: ?Goal: Pain level will decrease with appropriate interventions ?Outcome: Progressing ?  ?Problem: Pain Managment: ?Goal: General experience of comfort will improve ?Outcome: Progressing ?  ?Problem: Safety: ?Goal: Ability to remain free from injury will improve ?Outcome: Progressing ?  ?
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
## 2022-02-25 NOTE — Plan of Care (Signed)
  Problem: Education: Goal: Knowledge of General Education information will improve Description Including pain rating scale, medication(s)/side effects and non-pharmacologic comfort measures Outcome: Progressing   

## 2022-02-25 NOTE — Evaluation (Signed)
Physical Therapy Evaluation ?Patient Details ?Name: Colleen Medina ?MRN: [id number] ?DOB: [birth year] ?Today's Date: 02/25/2022 ? ?History of Present Illness ? Pt s/p R TKR and with hx of DM chronic venous insufficiency and aortic atherosclerosis.  ?Clinical Impression ? Pt s/p R TKR and presents with decreased R LE strength/ROM and post op pain limiting functional mobility.  Pt should progress to dc home with family assist and reports first OP PT scheduled for 02/28/22.  This session, pt alos limited by c/o dizziness and nausea with attempts to mobilize - BP supine 136/75; sit 126/81; stand 103/66, and after ambulating 8' and returning to chair 129/75.  RN aware. ?   ? ?Recommendations for follow up therapy are one component of a multi-disciplinary discharge planning process, led by the attending physician.  Recommendations may be updated based on patient status, additional functional criteria and insurance authorization. ? ?Follow Up Recommendations Follow physician's recommendations for discharge plan and follow up therapies ? ?  ?Assistance Recommended at Discharge Frequent or constant Supervision/Assistance  ?Patient can return home with the following ? A little help with walking and/or transfers;A little help with bathing/dressing/bathroom;Assist for transportation;Help with stairs or ramp for entrance;Assistance with cooking/housework ? ?  ?Equipment Recommendations None recommended by PT  ?Recommendations for Other Services ?    ?  ?Functional Status Assessment Patient has had a recent decline in their functional status and demonstrates the ability to make significant improvements in function in a reasonable and predictable amount of time.  ? ?  ?Precautions / Restrictions Precautions ?Precautions: Fall;Knee ?Restrictions ?Weight Bearing Restrictions: No ?RLE Weight Bearing: Weight bearing as tolerated  ? ?  ? ?Mobility ? Bed Mobility ?Overal bed mobility: Needs Assistance ?Bed Mobility: Supine to Sit ?  ?   ?Supine to sit: Min assist ?  ?  ?General bed mobility comments: min assist for R LE management ?  ? ?Transfers ?Overall transfer level: Needs assistance ?Equipment used: Rolling walker (2 wheels) ?Transfers: Sit to/from Stand ?Sit to Stand: Min assist, From elevated surface ?  ?  ?  ?  ?  ?General transfer comment: cues for LE management and use of UEs to self assist ?  ? ?Ambulation/Gait ?Ambulation/Gait assistance: Min assist, +2 safety/equipment ?Gait Distance (Feet): 8 Feet ?Assistive device: Rolling walker (2 wheels) ?Gait Pattern/deviations: Step-to pattern, Decreased step length - right, Decreased step length - left, Shuffle, Trunk flexed ?Gait velocity: decr ?  ?  ?General Gait Details: cues for sequence, posture and position from RW; distance ltd by c/p dizziness and nausea ? ?Stairs ?  ?  ?  ?  ?  ? ?Wheelchair Mobility ?  ? ?Modified Rankin (Stroke Patients Only) ?  ? ?  ? ?Balance Overall balance assessment: Needs assistance ?Sitting-balance support: No upper extremity supported, Feet supported ?Sitting balance-Leahy Scale: Fair ?  ?  ?Standing balance support: Bilateral upper extremity supported ?Standing balance-Leahy Scale: Poor ?  ?  ?  ?  ?  ?  ?  ?  ?  ?  ?  ?  ?   ? ? ? ?Pertinent Vitals/Pain Pain Assessment ?Pain Assessment: 0-10 ?Pain Score: 5  ?Pain Location: R knee ?Pain Descriptors / Indicators: Aching, Sore ?Pain Intervention(s): Limited activity within patient's tolerance, Monitored during session, Premedicated before session, Ice applied  ? ? ?Home Living Family/patient expects to be discharged to:: Private residence ?Living Arrangements: Children ?Available Help at Discharge: Family ?Type of Home: House ?Home Access: Stairs to enter ?Entrance Stairs-Rails: Right ?Entrance Stairs-Number of Steps:  3 ?  ?Home Layout: One level ?Home Equipment: Conservation officer, nature (2 wheels);Cane - single point;Grab bars - tub/shower ?Additional Comments: pt has a lot of equipment that was her husbands  ?   ?Prior Function Prior Level of Function : Independent/Modified Independent ?  ?  ?  ?  ?  ?  ?  ?  ?  ? ? ?Hand Dominance  ?   ? ?  ?Extremity/Trunk Assessment  ? Upper Extremity Assessment ?Upper Extremity Assessment: Overall WFL for tasks assessed ?  ? ?Lower Extremity Assessment ?Lower Extremity Assessment: RLE deficits/detail ?RLE Deficits / Details: 3/5 quads with IND SLR;  AAROM at knee -5 - 50 ?  ? ?Cervical / Trunk Assessment ?Cervical / Trunk Assessment: Normal  ?Communication  ? Communication: No difficulties  ?Cognition Arousal/Alertness: Awake/alert ?Behavior During Therapy: [facility] for tasks assessed/performed ?Overall Cognitive Status: Within Functional Limits for tasks assessed ?  ?  ?  ?  ?  ?  ?  ?  ?  ?  ?  ?  ?  ?  ?  ?  ?  ?  ?  ? ?  ?General Comments   ? ?  ?Exercises Total Joint Exercises ?Ankle Circles/Pumps: AROM, Both, 15 reps, Supine ?Quad Sets: AROM, Both, 10 reps, Supine ?Heel Slides: AAROM, Right, 10 reps, Supine ?Straight Leg Raises: AAROM, AROM, 10 reps, Supine  ? ?Assessment/Plan  ?  ?PT Assessment Patient needs continued PT services  ?PT Problem List Decreased strength;Decreased range of motion;Decreased activity tolerance;Decreased balance;Decreased mobility;Decreased knowledge of use of DME;Pain ? ?   ?  ?PT Treatment Interventions DME instruction;Gait training;Stair training;Functional mobility training;Therapeutic activities;Therapeutic exercise;Patient/family education;Balance training   ? ?PT Goals (Current goals can be found in the Care Plan section)  ?Acute Rehab PT Goals ?Patient Stated Goal: Regain IND ?PT Goal Formulation: With patient ?Time For Goal Achievement: 03/04/22 ?Potential to Achieve Goals: Good ? ?  ?Frequency 7X/week ?  ? ? ?Co-evaluation   ?  ?  ?  ?  ? ? ?  ?AM-PAC PT "6 Clicks" Mobility  ?Outcome Measure Help needed turning from your back to your side while in a flat bed without using bedrails?: A Little ?Help needed moving from lying on your back to sitting on  the side of a flat bed without using bedrails?: A Little ?Help needed moving to and from a bed to a chair (including a wheelchair)?: A Little ?Help needed standing up from a chair using your arms (e.g., wheelchair or bedside chair)?: A Little ?Help needed to walk in hospital room?: A Little ?Help needed climbing 3-5 steps with a railing? : A Lot ?6 Click Score: 17 ? ?  ?End of Session Equipment Utilized During Treatment: Gait belt ?Activity Tolerance: Other (comment) (dizzy, nauseous) ?Patient left: in chair;with call bell/phone within reach;with chair alarm set ?Nurse Communication: Mobility status (dizzy, nauseous) ?PT Visit Diagnosis: Difficulty in walking, not elsewhere classified (R26.2);Pain ?Pain - Right/Left: Right ?Pain - part of body: Knee ?  ? ?Time: 8937-3428 ?PT Time Calculation (min) (ACUTE ONLY): 30 min ? ? ?Charges:   PT Evaluation ?$PT Eval Low Complexity: 1 Low ?PT Treatments ?$Therapeutic Exercise: 8-22 mins ?  ?   ? ? ?Debe Coder PT ?Acute Rehabilitation Services ?Pager [phone] ?Office [phone] ? ? ?Kayela Humphres ?02/25/2022, 12:55 PM ? ?

## 2022-02-25 NOTE — TOC Transition Note (Signed)
Transition of Care (TOC) - CM/SW Discharge Note ? ?Patient Details  ?Name: Colleen Medina ?MRN: [id number] ?Date of Birth: May 01, 1941 ? ?Transition of Care (TOC) CM/SW Contact:  ?Sherie Don, LCSW ?Phone Number: ?02/25/2022, 10:07 AM ? ?Clinical Narrative: Patient is expected to discharge home after working with PT. CSW met with patient to discuss discharge plan. Patient will discharge home with OPPT at [facility]. Patient has a rolling walker at home, so there are no DME needs at this time. TOC signing off. ? ?Final next level of care: OP Rehab ?Barriers to Discharge: No Barriers Identified ? ?Patient Goals and CMS Choice ?Patient states their goals for this hospitalization and ongoing recovery are:: Discharge home with OPPT at [facility] ?Choice offered to / list presented to : NA ? ?Discharge Plan and Services        ?DME Arranged: N/A ?DME Agency: NA ? ?Readmission Risk Interventions ?No flowsheet data found. ? ?

## 2022-02-25 NOTE — Progress Notes (Signed)
Physical Therapy Treatment ?Patient Details ?Name: Colleen Medina ?MRN: [id number] ?DOB: 09/17/1941 ?Today's Date: 02/25/2022 ? ? ?History of Present Illness Pt s/p R TKR and with hx of DM chronic venous insufficiency and aortic atherosclerosis. ? ?  ?PT Comments  ? ? Pt continues very cooperative and up to ambulate increased distance this pm with noted improvement in stability and with no c/o dizziness or nausea.   ?Recommendations for follow up therapy are one component of a multi-disciplinary discharge planning process, led by the attending physician.  Recommendations may be updated based on patient status, additional functional criteria and insurance authorization. ? ?Follow Up Recommendations ? Follow physician's recommendations for discharge plan and follow up therapies ?  ?  ?Assistance Recommended at Discharge Frequent or constant Supervision/Assistance  ?Patient can return home with the following A little help with walking and/or transfers;A little help with bathing/dressing/bathroom;Assist for transportation;Help with stairs or ramp for entrance;Assistance with cooking/housework ?  ?Equipment Recommendations ? None recommended by PT  ?  ?Recommendations for Other Services   ? ? ?  ?Precautions / Restrictions Precautions ?Precautions: Fall;Knee ?Restrictions ?Weight Bearing Restrictions: No ?RLE Weight Bearing: Weight bearing as tolerated  ?  ? ?Mobility ? Bed Mobility ?Overal bed mobility: Needs Assistance ?Bed Mobility: Supine to Sit, Sit to Supine ?  ?  ?Supine to sit: Min assist ?Sit to supine: Min assist ?  ?General bed mobility comments: min assist for R LE management ?  ? ?Transfers ?Overall transfer level: Needs assistance ?Equipment used: Rolling walker (2 wheels) ?Transfers: Sit to/from Stand ?Sit to Stand: Min assist ?  ?  ?  ?  ?  ?General transfer comment: cues for LE management and use of UEs to self assist ?  ? ?Ambulation/Gait ?Ambulation/Gait assistance: Min assist, +2  safety/equipment ?Gait Distance (Feet): 55 Feet ?Assistive device: Rolling walker (2 wheels) ?Gait Pattern/deviations: Step-to pattern, Decreased step length - right, Decreased step length - left, Shuffle, Trunk flexed ?Gait velocity: decr ?  ?  ?General Gait Details: cues for sequence, posture and position from RW; distance ltd by c/p dizziness and nausea ? ? ?Stairs ?  ?  ?  ?  ?  ? ? ?Wheelchair Mobility ?  ? ?Modified Rankin (Stroke Patients Only) ?  ? ? ?  ?Balance Overall balance assessment: Needs assistance ?Sitting-balance support: No upper extremity supported, Feet supported ?Sitting balance-Leahy Scale: Fair ?  ?  ?Standing balance support: Bilateral upper extremity supported ?Standing balance-Leahy Scale: Poor ?  ?  ?  ?  ?  ?  ?  ?  ?  ?  ?  ?  ?  ? ?  ?Cognition Arousal/Alertness: Awake/alert ?Behavior During Therapy: [facility] for tasks assessed/performed ?Overall Cognitive Status: Within Functional Limits for tasks assessed ?  ?  ?  ?  ?  ?  ?  ?  ?  ?  ?  ?  ?  ?  ?  ?  ?  ?  ?  ? ?  ?Exercises Total Joint Exercises ?Ankle Circles/Pumps: AROM, Both, 15 reps, Supine ?Quad Sets: AROM, Both, 10 reps, Supine ?Heel Slides: AAROM, Right, 10 reps, Supine ?Straight Leg Raises: AAROM, AROM, 10 reps, Supine ? ?  ?General Comments   ?  ?  ? ?Pertinent Vitals/Pain Pain Assessment ?Pain Assessment: 0-10 ?Pain Score: 5  ?Pain Location: R knee ?Pain Descriptors / Indicators: Aching, Sore ?Pain Intervention(s): Limited activity within patient's tolerance, Monitored during session, Premedicated before session, Ice applied  ? ? ?Home Living Family/patient  expects to be discharged to:: Private residence ?Living Arrangements: Children ?Available Help at Discharge: Family ?Type of Home: House ?Home Access: Stairs to enter ?Entrance Stairs-Rails: Right ?Entrance Stairs-Number of Steps: 3 ?  ?Home Layout: One level ?Home Equipment: Conservation officer, nature (2 wheels);Cane - single point;Grab bars - tub/shower ?Additional Comments: pt has  a lot of equipment that was her husbands  ?  ?Prior Function    ?  ?  ?   ? ?PT Goals (current goals can now be found in the care plan section) Acute Rehab PT Goals ?Patient Stated Goal: Regain IND ?PT Goal Formulation: With patient ?Time For Goal Achievement: 03/04/22 ?Potential to Achieve Goals: Good ?Progress towards PT goals: Progressing toward goals ? ?  ?Frequency ? ? ? 7X/week ? ? ? ?  ?PT Plan Current plan remains appropriate  ? ? ?Co-evaluation   ?  ?  ?  ?  ? ?  ?AM-PAC PT "6 Clicks" Mobility   ?Outcome Measure ? Help needed turning from your back to your side while in a flat bed without using bedrails?: A Little ?Help needed moving from lying on your back to sitting on the side of a flat bed without using bedrails?: A Little ?Help needed moving to and from a bed to a chair (including a wheelchair)?: A Little ?Help needed standing up from a chair using your arms (e.g., wheelchair or bedside chair)?: A Little ?Help needed to walk in hospital room?: A Little ?Help needed climbing 3-5 steps with a railing? : A Lot ?6 Click Score: 17 ? ?  ?End of Session Equipment Utilized During Treatment: Gait belt ?Activity Tolerance: Patient tolerated treatment well;Patient limited by fatigue ?Patient left: in bed;with call bell/phone within reach;with bed alarm set ?Nurse Communication: Mobility status ?PT Visit Diagnosis: Difficulty in walking, not elsewhere classified (R26.2);Pain ?Pain - Right/Left: Right ?Pain - part of body: Knee ?  ? ? ?Time: 3235-5732 ?PT Time Calculation (min) (ACUTE ONLY): 19 min ? ?Charges:  $Gait Training: 8-22 mins ?$Therapeutic Exercise: 8-22 mins          ?          ? ?Debe Coder PT ?Acute Rehabilitation Services ?Pager [phone] ?Office [phone] ? ? ? ?Louine Tenpenny ?02/25/2022, 3:59 PM ? ?

## 2022-02-26 DIAGNOSIS — M1711 Unilateral primary osteoarthritis, right knee: Secondary | ICD-10-CM | POA: Diagnosis not present

## 2022-02-26 LAB — CBC
HCT: 28.1 % — ABNORMAL LOW (ref 36.0–46.0)
Hemoglobin: 8.4 g/dL — ABNORMAL LOW (ref 12.0–15.0)
MCH: 23.8 pg — ABNORMAL LOW (ref 26.0–34.0)
MCHC: 29.9 g/dL — ABNORMAL LOW (ref 30.0–36.0)
MCV: 79.6 fL — ABNORMAL LOW (ref 80.0–100.0)
Platelets: 222 10*3/uL (ref 150–400)
RBC: 3.53 MIL/uL — ABNORMAL LOW (ref 3.87–5.11)
RDW: 14.7 % (ref 11.5–15.5)
WBC: 8.3 10*3/uL (ref 4.0–10.5)
nRBC: 0 % (ref 0.0–0.2)

## 2022-02-26 NOTE — Progress Notes (Signed)
Subjective: ?2 Days Post-Op Procedure(s) (LRB): ?COMPUTER ASSISTED TOTAL KNEE ARTHROPLASTY (Right) ? ?Patient reports pain as mild to moderate.  Denies fever, chills, N/V, CP, SOB.  Tolerating POs well.  Admits to flatus.  Believes that she is ready for D/C. ? ?Objective:  ? ?VITALS:  Temp:  [97.6 ?F (36.4 ?C)-97.8 ?F (36.6 ?C)] 97.6 ?F (36.4 ?C) (03/11 0529) ?Pulse Rate:  [66-83] 76 (03/11 0529) ?Resp:  [16-20] 20 (03/11 0529) ?BP: (131-162)/(62-84) 150/78 (03/11 0529) ?SpO2:  [96 %-97 %] 97 % (03/11 0529) ? ?General: WDWN patient in NAD. ?Psych:  Appropriate mood and affect. ?Neuro:  A&O x 3, Moving all extremities, sensation intact to light touch ?HEENT:  EOMs intact ?Chest:  Even non-labored respirations ?Skin:  Dressing C/D/I, no rashes or lesions ?Extremities: warm/dry, mild edema to R knee, no erythema or echymosis.  No lymphadenopathy. ?Pulses: Femoral 2+ ?MSK:  ROM: lacks 5 degrees TKE, MMT: able to perform quad set ? ? ?LABS ?Recent Labs  ?  02/25/22 ?0326 02/26/22 ?0316  ?HGB 8.7* 8.4*  ?WBC 8.3 8.3  ?PLT 212 222  ? ?Recent Labs  ?  02/25/22 ?0326  ?NA 133*  ?K 4.1  ?CL 101  ?CO2 23  ?BUN 17  ?CREATININE 1.10*  ?GLUCOSE 208*  ? ?No results for input(s): LABPT, INR in the last 72 hours. ? ? ?Assessment/Plan: ?2 Days Post-Op Procedure(s) (LRB): ?COMPUTER ASSISTED TOTAL KNEE ARTHROPLASTY (Right) ? ?Patient seen in rounds for Dr. Lyla Glassing. ?WBAT with walker ?D/C home with OPPT after working with therapy today. ?Plan for outpatient post-op visit with Dr. Lyla Glassing. ? ?Mechele Claude PA-C ?[facility] ?Office:  [phone]  ?

## 2022-02-26 NOTE — Plan of Care (Signed)
  Problem: Education: Goal: Knowledge of General Education information will improve Description: Including pain rating scale, medication(s)/side effects and non-pharmacologic comfort measures Outcome: Progressing   Problem: Health Behavior/Discharge Planning: Goal: Ability to manage health-related needs will improve Outcome: Progressing   Problem: Activity: Goal: Risk for activity intolerance will decrease Outcome: Progressing   

## 2022-02-26 NOTE — Progress Notes (Signed)
Physical Therapy Treatment ?Patient Details ?Name: Colleen Medina ?MRN: [id number] ?DOB: [birth year] ?Today's Date: 02/26/2022 ? ? ?History of Present Illness Pt s/p R TKR and with hx of DM chronic venous insufficiency and aortic atherosclerosis. ? ?  ?PT Comments  ? ? Pt continues very cooperative and progressing well with mobility.  Pt performed therex program with assist and up to ambulate increased distance in hall and with no c/o dizziness or nausea.   ?Recommendations for follow up therapy are one component of a multi-disciplinary discharge planning process, led by the attending physician.  Recommendations may be updated based on patient status, additional functional criteria and insurance authorization. ? ?Follow Up Recommendations ? Follow physician's recommendations for discharge plan and follow up therapies ?  ?  ?Assistance Recommended at Discharge Frequent or constant Supervision/Assistance  ?Patient can return home with the following A little help with walking and/or transfers;A little help with bathing/dressing/bathroom;Assist for transportation;Help with stairs or ramp for entrance;Assistance with cooking/housework ?  ?Equipment Recommendations ? Rolling walker (2 wheels) (youth level RW)  ?  ?Recommendations for Other Services   ? ? ?  ?Precautions / Restrictions Precautions ?Precautions: Fall;Knee ?Restrictions ?Weight Bearing Restrictions: No ?RLE Weight Bearing: Weight bearing as tolerated  ?  ? ?Mobility ? Bed Mobility ?Overal bed mobility: Needs Assistance ?Bed Mobility: Supine to Sit ?  ?  ?Supine to sit: Supervision ?  ?  ?General bed mobility comments: Increased time but no physical assist ?  ? ?Transfers ?Overall transfer level: Needs assistance ?Equipment used: Rolling walker (2 wheels) ?Transfers: Sit to/from Stand ?Sit to Stand: Min guard, Supervision ?  ?  ?  ?  ?  ?General transfer comment: pt self-cues for LE management and use of UEs to self assist ?   ? ?Ambulation/Gait ?Ambulation/Gait assistance: Min guard, Supervision ?Gait Distance (Feet): 100 Feet ?Assistive device: Rolling walker (2 wheels) ?Gait Pattern/deviations: Decreased step length - right, Decreased step length - left, Shuffle, Trunk flexed, Step-to pattern, Step-through pattern ?Gait velocity: decr ?  ?  ?General Gait Details: cues for sequence, posture and position from RW ? ? ?Stairs ?  ?  ?  ?  ?  ? ? ?Wheelchair Mobility ?  ? ?Modified Rankin (Stroke Patients Only) ?  ? ? ?  ?Balance Overall balance assessment: Needs assistance ?Sitting-balance support: No upper extremity supported, Feet supported ?Sitting balance-Leahy Scale: Good ?  ?  ?Standing balance support: No upper extremity supported ?Standing balance-Leahy Scale: Fair ?  ?  ?  ?  ?  ?  ?  ?  ?  ?  ?  ?  ?  ? ?  ?Cognition Arousal/Alertness: Awake/alert ?Behavior During Therapy: [facility] for tasks assessed/performed ?Overall Cognitive Status: Within Functional Limits for tasks assessed ?  ?  ?  ?  ?  ?  ?  ?  ?  ?  ?  ?  ?  ?  ?  ?  ?  ?  ?  ? ?  ?Exercises Total Joint Exercises ?Ankle Circles/Pumps: AROM, Both, 15 reps, Supine ?Quad Sets: AROM, Both, 10 reps, Supine ?Heel Slides: AAROM, Right, Supine, 15 reps ?Hip ABduction/ADduction: AAROM, Right, 10 reps, Supine ?Straight Leg Raises: AAROM, AROM, Supine, 15 reps ? ?  ?General Comments   ?  ?  ? ?Pertinent Vitals/Pain Pain Assessment ?Pain Assessment: 0-10 ?Pain Score: 4  ?Pain Location: R knee ?Pain Descriptors / Indicators: Aching, Sore ?Pain Intervention(s): Limited activity within patient's tolerance, Monitored during session, Premedicated before session, Ice applied  ? ? ?  Home Living   ?  ?  ?  ?  ?  ?  ?  ?  ?  ?   ?  ?Prior Function    ?  ?  ?   ? ?PT Goals (current goals can now be found in the care plan section) Acute Rehab PT Goals ?Patient Stated Goal: Regain IND ?PT Goal Formulation: With patient ?Time For Goal Achievement: 03/04/22 ?Potential to Achieve Goals: Good ?Progress  towards PT goals: Progressing toward goals ? ?  ?Frequency ? ? ? 7X/week ? ? ? ?  ?PT Plan Current plan remains appropriate  ? ? ?Co-evaluation   ?  ?  ?  ?  ? ?  ?AM-PAC PT "6 Clicks" Mobility   ?Outcome Measure ? Help needed turning from your back to your side while in a flat bed without using bedrails?: A Little ?Help needed moving from lying on your back to sitting on the side of a flat bed without using bedrails?: A Little ?Help needed moving to and from a bed to a chair (including a wheelchair)?: A Little ?Help needed standing up from a chair using your arms (e.g., wheelchair or bedside chair)?: A Little ?Help needed to walk in hospital room?: A Little ?Help needed climbing 3-5 steps with a railing? : A Lot ?6 Click Score: 17 ? ?  ?End of Session Equipment Utilized During Treatment: Gait belt ?Activity Tolerance: Patient tolerated treatment well ?Patient left: in chair;with call bell/phone within reach;with chair alarm set ?Nurse Communication: Mobility status ?PT Visit Diagnosis: Difficulty in walking, not elsewhere classified (R26.2);Pain ?Pain - Right/Left: Right ?Pain - part of body: Knee ?  ? ? ?Time: 0258-5277 ?PT Time Calculation (min) (ACUTE ONLY): 36 min ? ?Charges:  $Gait Training: 8-22 mins ?$Therapeutic Exercise: 8-22 mins          ?          ? ?Debe Coder PT ?Acute Rehabilitation Services ?Pager [phone] ?Office [phone] ? ? ? ?Jataya Wann ?02/26/2022, 12:02 PM ? ?

## 2022-02-26 NOTE — TOC Transition Note (Addendum)
Transition of Care (TOC) - CM/SW Discharge Note ? ? ?Patient Details  ?Name: Colleen Medina ?MRN: [id number] ?Date of Birth: 10-30-1941 ? ?Transition of Care (TOC) CM/SW Contact:  ?Ross Ludwig, LCSW ?Phone Number: ?02/26/2022, 1:03 PM ? ? ?Clinical Narrative:    ? ?Patient will be going home with home with a youth walker through [facility].  CSW signing off please reconsult with any other social work needs, home DME agency has been notified of planned discharge.  Patient is prearranged for outpatient PT. ? ? ?Final next level of care: [place] ?Barriers to Discharge: Barriers Resolved ? ? ?Patient Goals and CMS Choice ?Patient states their goals for this hospitalization and ongoing recovery are:: To return back home with home health. ?CMS Medicare.gov Compare Post Acute Care list provided to:: Patient ?Choice offered to / list presented to : Patient ? ?Discharge Placement ?  ?           ?  ?  ?  ?  ? ?Discharge Plan and Services ?  ?  ?           ?DME Arranged: [facility] ?DME Agency: [employer] ?Date DME Agency Contacted: 02/26/22 ?Time DME Agency Contacted: [phone] ?Representative spoke with at DME Agency: Delana Meyer ?  ?  ?  ?  ?  ? ?Social Determinants of Health (SDOH) Interventions ?  ? ? ?Readmission Risk Interventions ?No flowsheet data found. ? ? ? ? ?

## 2022-02-26 NOTE — Progress Notes (Signed)
Physical Therapy Treatment ?Patient Details ?Name: Colleen Medina ?MRN: [id number] ?DOB: [birth year] ?Today's Date: 02/26/2022 ? ? ?History of Present Illness Pt s/p R TKR and with hx of DM chronic venous insufficiency and aortic atherosclerosis. ? ?  ?PT Comments  ? ? Pt up to ambulate in hall, negotiated stairs and reviewed written HEP.  Pt eager for dc home.   ?Recommendations for follow up therapy are one component of a multi-disciplinary discharge planning process, led by the attending physician.  Recommendations may be updated based on patient status, additional functional criteria and insurance authorization. ? ?Follow Up Recommendations ? Follow physician's recommendations for discharge plan and follow up therapies ?  ?  ?Assistance Recommended at Discharge Frequent or constant Supervision/Assistance  ?Patient can return home with the following A little help with walking and/or transfers;A little help with bathing/dressing/bathroom;Assist for transportation;Help with stairs or ramp for entrance;Assistance with cooking/housework ?  ?Equipment Recommendations ? Rolling walker (2 wheels)  ?  ?Recommendations for Other Services   ? ? ?  ?Precautions / Restrictions Precautions ?Precautions: Fall;Knee ?Restrictions ?Weight Bearing Restrictions: No ?RLE Weight Bearing: Weight bearing as tolerated  ?  ? ?Mobility ? Bed Mobility ?Overal bed mobility: Needs Assistance ?Bed Mobility: Supine to Sit ?  ?  ?Supine to sit: Supervision ?  ?  ?General bed mobility comments: Pt up in chair and requests back to same ?  ? ?Transfers ?Overall transfer level: Needs assistance ?Equipment used: Rolling walker (2 wheels) ?Transfers: Sit to/from Stand ?Sit to Stand: Min guard, Supervision ?  ?  ?  ?  ?  ?General transfer comment: pt self-cues for LE management and use of UEs to self assist ?  ? ?Ambulation/Gait ?Ambulation/Gait assistance: Min guard, Supervision ?Gait Distance (Feet): 60 Feet ?Assistive device: Rolling walker (2  wheels) ?Gait Pattern/deviations: Decreased step length - right, Decreased step length - left, Shuffle, Trunk flexed, Step-to pattern, Step-through pattern ?Gait velocity: decr ?  ?  ?General Gait Details: cues for sequence, posture and position from RW ? ? ?Stairs ?Stairs: Yes ?Stairs assistance: Min assist ?Stair Management: One rail Left, Step to pattern, Forwards, With cane ?Number of Stairs: 5 ?General stair comments: 2+3 stairs with cues for sequence and foot/cane placement ? ? ?Wheelchair Mobility ?  ? ?Modified Rankin (Stroke Patients Only) ?  ? ? ?  ?Balance Overall balance assessment: Needs assistance ?Sitting-balance support: No upper extremity supported, Feet supported ?Sitting balance-Leahy Scale: Good ?  ?  ?Standing balance support: No upper extremity supported ?Standing balance-Leahy Scale: Fair ?  ?  ?  ?  ?  ?  ?  ?  ?  ?  ?  ?  ?  ? ?  ?Cognition Arousal/Alertness: Awake/alert ?Behavior During Therapy: [facility] for tasks assessed/performed ?Overall Cognitive Status: Within Functional Limits for tasks assessed ?  ?  ?  ?  ?  ?  ?  ?  ?  ?  ?  ?  ?  ?  ?  ?  ?General Comments: Pt appears slightly slower to process - ?meds ?  ?  ? ?  ?Exercises Total Joint Exercises ?Ankle Circles/Pumps: AROM, Both, 15 reps, Supine ?Quad Sets: AROM, Both, 10 reps, Supine ?Heel Slides: AAROM, Right, Supine, 15 reps ?Hip ABduction/ADduction: AAROM, Right, 10 reps, Supine ?Straight Leg Raises: AAROM, AROM, Supine, 15 reps ? ?  ?General Comments   ?  ?  ? ?Pertinent Vitals/Pain Pain Assessment ?Pain Assessment: 0-10 ?Pain Score: 4  ?Pain Location: R knee ?Pain Descriptors /  Indicators: Aching, Sore ?Pain Intervention(s): Limited activity within patient's tolerance, Premedicated before session, Monitored during session, Ice applied  ? ? ?Home Living   ?  ?  ?  ?  ?  ?  ?  ?  ?  ?   ?  ?Prior Function    ?  ?  ?   ? ?PT Goals (current goals can now be found in the care plan section) Acute Rehab PT Goals ?Patient Stated Goal:  Regain IND ?PT Goal Formulation: With patient ?Time For Goal Achievement: 03/04/22 ?Potential to Achieve Goals: Good ?Progress towards PT goals: Progressing toward goals ? ?  ?Frequency ? ? ? 7X/week ? ? ? ?  ?PT Plan Current plan remains appropriate  ? ? ?Co-evaluation   ?  ?  ?  ?  ? ?  ?AM-PAC PT "6 Clicks" Mobility   ?Outcome Measure ? Help needed turning from your back to your side while in a flat bed without using bedrails?: A Little ?Help needed moving from lying on your back to sitting on the side of a flat bed without using bedrails?: A Little ?Help needed moving to and from a bed to a chair (including a wheelchair)?: A Little ?Help needed standing up from a chair using your arms (e.g., wheelchair or bedside chair)?: A Little ?Help needed to walk in hospital room?: A Little ?Help needed climbing 3-5 steps with a railing? : A Little ?6 Click Score: 18 ? ?  ?End of Session Equipment Utilized During Treatment: Gait belt ?Activity Tolerance: Patient tolerated treatment well ?Patient left: in chair;with call bell/phone within reach;with chair alarm set ?Nurse Communication: Mobility status ?PT Visit Diagnosis: Difficulty in walking, not elsewhere classified (R26.2);Pain ?Pain - Right/Left: Right ?Pain - part of body: Knee ?  ? ? ?Time: 8563-1497 ?PT Time Calculation (min) (ACUTE ONLY): 20 min ? ?Charges:  $Gait Training: 8-22 mins ?$Therapeutic Exercise: 8-22 mins          ?          ? ?Debe Coder PT ?Acute Rehabilitation Services ?Pager [phone] ?Office [phone] ? ? ? ?Colleen Medina ?02/26/2022, 12:07 PM ? ?

## 2022-02-26 NOTE — Progress Notes (Signed)
The patient is alert and oriented and has been seen by her physician. The orders for discharge were written. IV has been removed. Went over discharge instructions with patient. She is being discharged via wheelchair with all of her belongings.   

## 2022-02-28 ENCOUNTER — Encounter (HOSPITAL_COMMUNITY): Payer: Self-pay | Admitting: Orthopedic Surgery

## 2022-06-03 ENCOUNTER — Other Ambulatory Visit: Payer: Self-pay

## 2022-06-03 ENCOUNTER — Encounter (HOSPITAL_BASED_OUTPATIENT_CLINIC_OR_DEPARTMENT_OTHER): Payer: Self-pay

## 2022-06-03 DIAGNOSIS — Z7982 Long term (current) use of aspirin: Secondary | ICD-10-CM | POA: Insufficient documentation

## 2022-06-03 DIAGNOSIS — I10 Essential (primary) hypertension: Secondary | ICD-10-CM | POA: Insufficient documentation

## 2022-06-03 DIAGNOSIS — E119 Type 2 diabetes mellitus without complications: Secondary | ICD-10-CM | POA: Diagnosis not present

## 2022-06-03 DIAGNOSIS — Z96651 Presence of right artificial knee joint: Secondary | ICD-10-CM | POA: Insufficient documentation

## 2022-06-03 DIAGNOSIS — J45909 Unspecified asthma, uncomplicated: Secondary | ICD-10-CM | POA: Diagnosis not present

## 2022-06-03 DIAGNOSIS — Z7951 Long term (current) use of inhaled steroids: Secondary | ICD-10-CM | POA: Diagnosis not present

## 2022-06-03 DIAGNOSIS — K529 Noninfective gastroenteritis and colitis, unspecified: Secondary | ICD-10-CM | POA: Diagnosis not present

## 2022-06-03 DIAGNOSIS — R112 Nausea with vomiting, unspecified: Secondary | ICD-10-CM | POA: Diagnosis present

## 2022-06-03 LAB — COMPREHENSIVE METABOLIC PANEL
ALT: 6 U/L (ref 0–44)
AST: 15 U/L (ref 15–41)
Albumin: 4.7 g/dL (ref 3.5–5.0)
Alkaline Phosphatase: 60 U/L (ref 38–126)
Anion gap: 18 — ABNORMAL HIGH (ref 5–15)
BUN: 11 mg/dL (ref 8–23)
CO2: 19 mmol/L — ABNORMAL LOW (ref 22–32)
Calcium: 10.3 mg/dL (ref 8.9–10.3)
Chloride: 96 mmol/L — ABNORMAL LOW (ref 98–111)
Creatinine, Ser: 0.79 mg/dL (ref 0.44–1.00)
GFR, Estimated: >60 mL/min (ref >60)
Glucose, Bld: 175 mg/dL — ABNORMAL HIGH (ref 70–99)
Potassium: 3.5 mmol/L (ref 3.5–5.1)
Sodium: 133 mmol/L — ABNORMAL LOW (ref 135–145)
Total Bilirubin: 1 mg/dL (ref 0.3–1.2)
Total Protein: 8.3 g/dL — ABNORMAL HIGH (ref 6.5–8.1)

## 2022-06-03 LAB — CBC
HCT: 36.3 % (ref 36.0–46.0)
Hemoglobin: 11.1 g/dL — ABNORMAL LOW (ref 12.0–15.0)
MCH: 22.8 pg — ABNORMAL LOW (ref 26.0–34.0)
MCHC: 30.6 g/dL (ref 30.0–36.0)
MCV: 74.7 fL — ABNORMAL LOW (ref 80.0–100.0)
Platelets: 274 10*3/uL (ref 150–400)
RBC: 4.86 MIL/uL (ref 3.87–5.11)
RDW: 15 % (ref 11.5–15.5)
WBC: 7 10*3/uL (ref 4.0–10.5)
nRBC: 0 % (ref 0.0–0.2)

## 2022-06-03 LAB — LIPASE, BLOOD: Lipase: <10 U/L — ABNORMAL LOW (ref 11–51)

## 2022-06-03 NOTE — ED Triage Notes (Signed)
Patient here POV from Home with Family.  Endorses ABD Pain, Nausea, Emesis since this AM. Has not eaten today due to Symptoms. Pain is generalized throughout ABD.  Uncomfortable in Triage. A&Ox4. GCS 15. BIB Wheelchair.

## 2022-06-04 ENCOUNTER — Emergency Department (HOSPITAL_BASED_OUTPATIENT_CLINIC_OR_DEPARTMENT_OTHER): Payer: Medicare HMO

## 2022-06-04 ENCOUNTER — Emergency Department (HOSPITAL_BASED_OUTPATIENT_CLINIC_OR_DEPARTMENT_OTHER)
Admission: EM | Admit: 2022-06-04 | Discharge: 2022-06-04 | Disposition: A | Discharge status: Home / Self Care | Payer: Medicare HMO | Attending: Emergency Medicine | Admitting: Emergency Medicine

## 2022-06-04 ENCOUNTER — Encounter (HOSPITAL_BASED_OUTPATIENT_CLINIC_OR_DEPARTMENT_OTHER): Payer: Self-pay | Admitting: Emergency Medicine

## 2022-06-04 DIAGNOSIS — K529 Noninfective gastroenteritis and colitis, unspecified: Secondary | ICD-10-CM

## 2022-06-04 MED ORDER — IOHEXOL 300 MG/ML  SOLN
100.0000 mL | Freq: Once | INTRAMUSCULAR | Status: AC | PRN
  Administered 2022-06-04: 80 mL via INTRAVENOUS

## 2022-06-04 MED ORDER — ONDANSETRON HCL 4 MG/2ML IJ SOLN
4.0000 mg | Freq: Once | INTRAMUSCULAR | Status: AC
  Administered 2022-06-04: 4 mg via INTRAVENOUS
  Filled 2022-06-04: qty 2

## 2022-06-04 MED ORDER — ENALAPRILAT 1.25 MG/ML IV INJ
1.2500 mg | INJECTION | Freq: Once | INTRAVENOUS | Status: AC
Start: 2022-06-04 — End: 2022-06-04
  Administered 2022-06-04: 1.25 mg via INTRAVENOUS
  Filled 2022-06-04: qty 2

## 2022-06-04 MED ORDER — DICYCLOMINE HCL 20 MG PO TABS: 10.0000 mg | ORAL_TABLET | Freq: Four times a day (QID) | ORAL | 0 refills | Status: DC | PRN

## 2022-06-04 MED ORDER — ONDANSETRON HCL 8 MG PO TABS
8.0000 mg | ORAL_TABLET | Freq: Three times a day (TID) | ORAL | 0 refills | Status: DC | PRN
Start: 2022-06-04 — End: 2022-09-16

## 2022-06-04 MED ORDER — DICYCLOMINE HCL 10 MG/ML IM SOLN
10.0000 mg | Freq: Once | INTRAMUSCULAR | Status: AC
Start: 2022-06-04 — End: 2022-06-04
  Administered 2022-06-04: 10 mg via INTRAMUSCULAR
  Filled 2022-06-04: qty 2

## 2022-06-04 MED ORDER — SODIUM CHLORIDE 0.9 % IV BOLUS
1000.0000 mL | Freq: Once | INTRAVENOUS | Status: AC
  Administered 2022-06-04: 1000 mL via INTRAVENOUS

## 2022-06-04 NOTE — ED Notes (Signed)
Pt and family agreeable with d/c plan as discussed by provider- this nurse has verbally reinforced d/c instructions and provided pt with written copy - pt acknowledges verbal understanding and denies any additional questions, concerns, needs- escorted to vehicle via w/c by this nurse with family member as driver.  No distress - BP improvement at d/c

## 2022-06-04 NOTE — ED Notes (Signed)
Family member to nurses station reporting bp reading high; currently 191/102  - pt denies cp/reports mild sob - O2 sats stable '@97'$ % RA-- RR even and unlabored at this time.  When probed further regarding HA/dizziness pt denies dizziness and states HA rated 5/10 that has been persistent "all day" -- does also report ongoing abd pain with very mild nausea at this time.  Pt now assisted to hall bathroom via w/c - will monitor for acute changes and maintain plan of care
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
## 2022-09-06 ENCOUNTER — Emergency Department (HOSPITAL_BASED_OUTPATIENT_CLINIC_OR_DEPARTMENT_OTHER)
Admission: EM | Admit: 2022-09-06 | Discharge: 2022-09-06 | Disposition: A | Discharge status: Home / Self Care | Payer: Medicare HMO | Attending: Emergency Medicine | Admitting: Emergency Medicine

## 2022-09-06 ENCOUNTER — Emergency Department (HOSPITAL_BASED_OUTPATIENT_CLINIC_OR_DEPARTMENT_OTHER): Payer: Medicare HMO

## 2022-09-06 ENCOUNTER — Other Ambulatory Visit: Payer: Self-pay

## 2022-09-06 ENCOUNTER — Encounter (HOSPITAL_BASED_OUTPATIENT_CLINIC_OR_DEPARTMENT_OTHER): Payer: Self-pay | Admitting: Emergency Medicine

## 2022-09-06 DIAGNOSIS — I10 Essential (primary) hypertension: Secondary | ICD-10-CM | POA: Diagnosis not present

## 2022-09-06 DIAGNOSIS — R112 Nausea with vomiting, unspecified: Secondary | ICD-10-CM | POA: Diagnosis not present

## 2022-09-06 DIAGNOSIS — E119 Type 2 diabetes mellitus without complications: Secondary | ICD-10-CM | POA: Insufficient documentation

## 2022-09-06 DIAGNOSIS — Z7951 Long term (current) use of inhaled steroids: Secondary | ICD-10-CM | POA: Insufficient documentation

## 2022-09-06 DIAGNOSIS — R1084 Generalized abdominal pain: Secondary | ICD-10-CM | POA: Diagnosis present

## 2022-09-06 DIAGNOSIS — Z7982 Long term (current) use of aspirin: Secondary | ICD-10-CM | POA: Insufficient documentation

## 2022-09-06 DIAGNOSIS — J45909 Unspecified asthma, uncomplicated: Secondary | ICD-10-CM | POA: Diagnosis not present

## 2022-09-06 DIAGNOSIS — K529 Noninfective gastroenteritis and colitis, unspecified: Secondary | ICD-10-CM | POA: Insufficient documentation

## 2022-09-06 LAB — CBC WITH DIFFERENTIAL/PLATELET
Abs Immature Granulocytes: 0.02 10*3/uL (ref 0.00–0.07)
Basophils Absolute: 0 10*3/uL (ref 0.0–0.1)
Basophils Relative: 0 %
Eosinophils Absolute: 0 10*3/uL (ref 0.0–0.5)
Eosinophils Relative: 0 %
HCT: 36.7 % (ref 36.0–46.0)
Hemoglobin: 11.5 g/dL — ABNORMAL LOW (ref 12.0–15.0)
Immature Granulocytes: 0 %
Lymphocytes Relative: 8 %
Lymphs Abs: 0.5 10*3/uL — ABNORMAL LOW (ref 0.7–4.0)
MCH: 23.9 pg — ABNORMAL LOW (ref 26.0–34.0)
MCHC: 31.3 g/dL (ref 30.0–36.0)
MCV: 76.1 fL — ABNORMAL LOW (ref 80.0–100.0)
Monocytes Absolute: 0.2 10*3/uL (ref 0.1–1.0)
Monocytes Relative: 2 %
Neutro Abs: 6.4 10*3/uL (ref 1.7–7.7)
Neutrophils Relative %: 90 %
Platelets: 267 10*3/uL (ref 150–400)
RBC: 4.82 MIL/uL (ref 3.87–5.11)
RDW: 15.1 % (ref 11.5–15.5)
WBC: 7.1 10*3/uL (ref 4.0–10.5)
nRBC: 0 % (ref 0.0–0.2)

## 2022-09-06 LAB — COMPREHENSIVE METABOLIC PANEL
ALT: 8 U/L (ref 0–44)
AST: 28 U/L (ref 15–41)
Albumin: 5.1 g/dL — ABNORMAL HIGH (ref 3.5–5.0)
Alkaline Phosphatase: 66 U/L (ref 38–126)
Anion gap: 15 (ref 5–15)
BUN: 10 mg/dL (ref 8–23)
CO2: 24 mmol/L (ref 22–32)
Calcium: 10.2 mg/dL (ref 8.9–10.3)
Chloride: 92 mmol/L — ABNORMAL LOW (ref 98–111)
Creatinine, Ser: 0.8 mg/dL (ref 0.44–1.00)
GFR, Estimated: >60 mL/min (ref >60)
Glucose, Bld: 165 mg/dL — ABNORMAL HIGH (ref 70–99)
Potassium: 4.6 mmol/L (ref 3.5–5.1)
Sodium: 131 mmol/L — ABNORMAL LOW (ref 135–145)
Total Bilirubin: 0.8 mg/dL (ref 0.3–1.2)
Total Protein: 8.6 g/dL — ABNORMAL HIGH (ref 6.5–8.1)

## 2022-09-06 LAB — LIPASE, BLOOD: Lipase: <10 U/L — ABNORMAL LOW (ref 11–51)

## 2022-09-06 LAB — URINALYSIS, ROUTINE W REFLEX MICROSCOPIC
Bilirubin Urine: NEGATIVE
Glucose, UA: NEGATIVE mg/dL
Ketones, ur: NEGATIVE mg/dL
Leukocytes,Ua: NEGATIVE
Nitrite: NEGATIVE
Protein, ur: 30 mg/dL — AB
Specific Gravity, Urine: 1.016 (ref 1.005–1.030)
pH: 7 (ref 5.0–8.0)

## 2022-09-06 MED ORDER — ONDANSETRON HCL 4 MG/2ML IJ SOLN
4.0000 mg | Freq: Once | INTRAMUSCULAR | Status: AC
  Administered 2022-09-06: 4 mg via INTRAVENOUS
  Filled 2022-09-06: qty 2

## 2022-09-06 MED ORDER — IOHEXOL 300 MG/ML  SOLN
100.0000 mL | Freq: Once | INTRAMUSCULAR | Status: AC | PRN
  Administered 2022-09-06: 60 mL via INTRAVENOUS

## 2022-09-06 MED ORDER — AZITHROMYCIN 250 MG PO TABS: 250.0000 mg | ORAL_TABLET | Freq: Every day | ORAL | 0 refills | Status: DC

## 2022-09-06 MED ORDER — ONDANSETRON 4 MG PO TBDP: 4.0000 mg | ORAL_TABLET | Freq: Three times a day (TID) | ORAL | 0 refills | Status: DC | PRN

## 2022-09-06 MED ORDER — DICYCLOMINE HCL 10 MG/ML IM SOLN
20.0000 mg | Freq: Once | INTRAMUSCULAR | Status: AC
  Administered 2022-09-06: 20 mg via INTRAMUSCULAR
  Filled 2022-09-06: qty 2

## 2022-09-06 MED ORDER — LACTATED RINGERS IV BOLUS
1000.0000 mL | Freq: Once | INTRAVENOUS | Status: AC
Start: 2022-09-06 — End: 2022-09-06
  Administered 2022-09-06: 1000 mL via INTRAVENOUS

## 2022-09-06 MED ORDER — DICYCLOMINE HCL 20 MG PO TABS: 20.0000 mg | ORAL_TABLET | Freq: Two times a day (BID) | ORAL | 0 refills | Status: AC | PRN

## 2022-09-06 MED ORDER — FENTANYL CITRATE PF 50 MCG/ML IJ SOSY
50.0000 ug | PREFILLED_SYRINGE | Freq: Once | INTRAMUSCULAR | Status: AC
  Administered 2022-09-06: 50 ug via INTRAVENOUS
  Filled 2022-09-06: qty 1

## 2022-09-06 NOTE — ED Notes (Signed)
Pt placed on a bedpan.  
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
## 2022-09-06 NOTE — ED Triage Notes (Signed)
Pt reports abdominal pain along with several episodes of diarrhea accompanied by nausea and vomiting. Pt  has not been able to hold any food down.

## 2022-09-15 ENCOUNTER — Encounter (HOSPITAL_COMMUNITY): Payer: Self-pay

## 2022-09-15 ENCOUNTER — Emergency Department (HOSPITAL_COMMUNITY): Payer: Medicare HMO

## 2022-09-15 ENCOUNTER — Inpatient Hospital Stay (HOSPITAL_COMMUNITY)
Admission: EM | Admit: 2022-09-15 | Discharge: 2022-09-21 | DRG: 392 | Disposition: A | Discharge status: Home / Self Care | Payer: Medicare HMO | Attending: Internal Medicine | Admitting: Internal Medicine

## 2022-09-15 ENCOUNTER — Other Ambulatory Visit: Payer: Self-pay

## 2022-09-15 DIAGNOSIS — E860 Dehydration: Secondary | ICD-10-CM | POA: Diagnosis present

## 2022-09-15 DIAGNOSIS — Z96651 Presence of right artificial knee joint: Secondary | ICD-10-CM | POA: Diagnosis present

## 2022-09-15 DIAGNOSIS — Z833 Family history of diabetes mellitus: Secondary | ICD-10-CM

## 2022-09-15 DIAGNOSIS — Z9842 Cataract extraction status, left eye: Secondary | ICD-10-CM

## 2022-09-15 DIAGNOSIS — Z79899 Other long term (current) drug therapy: Secondary | ICD-10-CM

## 2022-09-15 DIAGNOSIS — E876 Hypokalemia: Secondary | ICD-10-CM | POA: Diagnosis present

## 2022-09-15 DIAGNOSIS — K529 Noninfective gastroenteritis and colitis, unspecified: Principal | ICD-10-CM

## 2022-09-15 DIAGNOSIS — K5730 Diverticulosis of large intestine without perforation or abscess without bleeding: Secondary | ICD-10-CM | POA: Diagnosis present

## 2022-09-15 DIAGNOSIS — K644 Residual hemorrhoidal skin tags: Secondary | ICD-10-CM | POA: Diagnosis present

## 2022-09-15 DIAGNOSIS — E440 Moderate protein-calorie malnutrition: Secondary | ICD-10-CM | POA: Diagnosis present

## 2022-09-15 DIAGNOSIS — J029 Acute pharyngitis, unspecified: Secondary | ICD-10-CM | POA: Diagnosis present

## 2022-09-15 DIAGNOSIS — Z87442 Personal history of urinary calculi: Secondary | ICD-10-CM

## 2022-09-15 DIAGNOSIS — Z90710 Acquired absence of both cervix and uterus: Secondary | ICD-10-CM

## 2022-09-15 DIAGNOSIS — K219 Gastro-esophageal reflux disease without esophagitis: Secondary | ICD-10-CM | POA: Diagnosis present

## 2022-09-15 DIAGNOSIS — I739 Peripheral vascular disease, unspecified: Secondary | ICD-10-CM | POA: Diagnosis present

## 2022-09-15 DIAGNOSIS — E785 Hyperlipidemia, unspecified: Secondary | ICD-10-CM | POA: Diagnosis present

## 2022-09-15 DIAGNOSIS — E119 Type 2 diabetes mellitus without complications: Secondary | ICD-10-CM | POA: Diagnosis present

## 2022-09-15 DIAGNOSIS — Z825 Family history of asthma and other chronic lower respiratory diseases: Secondary | ICD-10-CM

## 2022-09-15 DIAGNOSIS — Z6822 Body mass index (BMI) 22.0-22.9, adult: Secondary | ICD-10-CM

## 2022-09-15 DIAGNOSIS — Z880 Allergy status to penicillin: Secondary | ICD-10-CM

## 2022-09-15 DIAGNOSIS — Z79891 Long term (current) use of opiate analgesic: Secondary | ICD-10-CM

## 2022-09-15 DIAGNOSIS — E872 Acidosis, unspecified: Secondary | ICD-10-CM

## 2022-09-15 DIAGNOSIS — D509 Iron deficiency anemia, unspecified: Secondary | ICD-10-CM | POA: Diagnosis present

## 2022-09-15 DIAGNOSIS — Z1152 Encounter for screening for COVID-19: Secondary | ICD-10-CM

## 2022-09-15 DIAGNOSIS — K648 Other hemorrhoids: Secondary | ICD-10-CM | POA: Diagnosis present

## 2022-09-15 DIAGNOSIS — I10 Essential (primary) hypertension: Secondary | ICD-10-CM | POA: Diagnosis present

## 2022-09-15 DIAGNOSIS — I700 Atherosclerosis of aorta: Secondary | ICD-10-CM | POA: Diagnosis present

## 2022-09-15 DIAGNOSIS — Z9841 Cataract extraction status, right eye: Secondary | ICD-10-CM

## 2022-09-15 DIAGNOSIS — Z7982 Long term (current) use of aspirin: Secondary | ICD-10-CM

## 2022-09-15 DIAGNOSIS — Z8249 Family history of ischemic heart disease and other diseases of the circulatory system: Secondary | ICD-10-CM

## 2022-09-15 DIAGNOSIS — Z9049 Acquired absence of other specified parts of digestive tract: Secondary | ICD-10-CM

## 2022-09-15 DIAGNOSIS — Z961 Presence of intraocular lens: Secondary | ICD-10-CM | POA: Diagnosis present

## 2022-09-15 DIAGNOSIS — N179 Acute kidney failure, unspecified: Secondary | ICD-10-CM | POA: Diagnosis present

## 2022-09-15 DIAGNOSIS — M797 Fibromyalgia: Secondary | ICD-10-CM | POA: Diagnosis present

## 2022-09-15 DIAGNOSIS — Z8719 Personal history of other diseases of the digestive system: Secondary | ICD-10-CM

## 2022-09-15 DIAGNOSIS — Z83438 Family history of other disorder of lipoprotein metabolism and other lipidemia: Secondary | ICD-10-CM

## 2022-09-15 DIAGNOSIS — M8580 Other specified disorders of bone density and structure, unspecified site: Secondary | ICD-10-CM | POA: Diagnosis present

## 2022-09-15 LAB — CBC WITH DIFFERENTIAL/PLATELET
Abs Immature Granulocytes: 0.02 10*3/uL (ref 0.00–0.07)
Basophils Absolute: 0 10*3/uL (ref 0.0–0.1)
Basophils Relative: 0 %
Eosinophils Absolute: 0.1 10*3/uL (ref 0.0–0.5)
Eosinophils Relative: 2 %
HCT: 36.8 % (ref 36.0–46.0)
Hemoglobin: 11.1 g/dL — ABNORMAL LOW (ref 12.0–15.0)
Immature Granulocytes: 0 %
Lymphocytes Relative: 22 %
Lymphs Abs: 1.1 10*3/uL (ref 0.7–4.0)
MCH: 23.9 pg — ABNORMAL LOW (ref 26.0–34.0)
MCHC: 30.2 g/dL (ref 30.0–36.0)
MCV: 79.1 fL — ABNORMAL LOW (ref 80.0–100.0)
Monocytes Absolute: 0.4 10*3/uL (ref 0.1–1.0)
Monocytes Relative: 8 %
Neutro Abs: 3.5 10*3/uL (ref 1.7–7.7)
Neutrophils Relative %: 68 %
Platelets: 259 10*3/uL (ref 150–400)
RBC: 4.65 MIL/uL (ref 3.87–5.11)
RDW: 14.8 % (ref 11.5–15.5)
WBC: 5.1 10*3/uL (ref 4.0–10.5)
nRBC: 0 % (ref 0.0–0.2)

## 2022-09-15 LAB — COMPREHENSIVE METABOLIC PANEL
ALT: 13 U/L (ref 0–44)
AST: 29 U/L (ref 15–41)
Albumin: 4.1 g/dL (ref 3.5–5.0)
Alkaline Phosphatase: 62 U/L (ref 38–126)
Anion gap: 19 — ABNORMAL HIGH (ref 5–15)
BUN: 26 mg/dL — ABNORMAL HIGH (ref 8–23)
CO2: 21 mmol/L — ABNORMAL LOW (ref 22–32)
Calcium: 9.5 mg/dL (ref 8.9–10.3)
Chloride: 97 mmol/L — ABNORMAL LOW (ref 98–111)
Creatinine, Ser: 1.75 mg/dL — ABNORMAL HIGH (ref 0.44–1.00)
GFR, Estimated: 29 mL/min — ABNORMAL LOW (ref >60)
Glucose, Bld: 124 mg/dL — ABNORMAL HIGH (ref 70–99)
Potassium: 3.4 mmol/L — ABNORMAL LOW (ref 3.5–5.1)
Sodium: 137 mmol/L (ref 135–145)
Total Bilirubin: 1.2 mg/dL (ref 0.3–1.2)
Total Protein: 7.3 g/dL (ref 6.5–8.1)

## 2022-09-15 LAB — RESP PANEL BY RT-PCR (FLU A&B, COVID) ARPGX2
Influenza A by PCR: NEGATIVE
Influenza B by PCR: NEGATIVE
SARS Coronavirus 2 by RT PCR: NEGATIVE

## 2022-09-15 LAB — URINALYSIS, ROUTINE W REFLEX MICROSCOPIC
Bilirubin Urine: NEGATIVE
Glucose, UA: NEGATIVE mg/dL
Hgb urine dipstick: NEGATIVE
Ketones, ur: 5 mg/dL — AB
Leukocytes,Ua: NEGATIVE
Nitrite: NEGATIVE
Protein, ur: >=300 mg/dL — AB
Specific Gravity, Urine: 1.025 (ref 1.005–1.030)
pH: 5 (ref 5.0–8.0)

## 2022-09-15 LAB — LACTIC ACID, PLASMA
Lactic Acid, Venous: 5.5 mmol/L (ref 0.5–1.9)
Lactic Acid, Venous: 4.6 mmol/L (ref 0.5–1.9)

## 2022-09-15 LAB — PROTIME-INR
INR: 1.1 (ref 0.8–1.2)
Prothrombin Time: 13.7 seconds (ref 11.4–15.2)

## 2022-09-15 LAB — LIPASE, BLOOD: Lipase: 47 U/L (ref 11–51)

## 2022-09-15 LAB — APTT: aPTT: 27 seconds (ref 24–36)

## 2022-09-15 MED ORDER — ONDANSETRON HCL 4 MG/2ML IJ SOLN
4.0000 mg | Freq: Once | INTRAMUSCULAR | Status: AC
  Administered 2022-09-15: 4 mg via INTRAVENOUS
  Filled 2022-09-15: qty 2

## 2022-09-15 MED ORDER — LACTATED RINGERS IV BOLUS (SEPSIS)
250.0000 mL | Freq: Once | INTRAVENOUS | Status: AC
  Administered 2022-09-15: 250 mL via INTRAVENOUS

## 2022-09-15 MED ORDER — MORPHINE SULFATE (PF) 2 MG/ML IV SOLN
4.0000 mg | Freq: Once | INTRAVENOUS | Status: AC
  Administered 2022-09-15: 4 mg via INTRAVENOUS
  Filled 2022-09-15: qty 2

## 2022-09-15 MED ORDER — LACTATED RINGERS IV BOLUS (SEPSIS)
1000.0000 mL | Freq: Once | INTRAVENOUS | Status: AC
  Administered 2022-09-15: 1000 mL via INTRAVENOUS

## 2022-09-15 MED ORDER — LACTATED RINGERS IV BOLUS (SEPSIS)
500.0000 mL | Freq: Once | INTRAVENOUS | Status: AC
  Administered 2022-09-15: 500 mL via INTRAVENOUS

## 2022-09-15 MED ORDER — LACTATED RINGERS IV SOLN: INTRAVENOUS | Status: DC

## 2022-09-15 MED ORDER — POTASSIUM CHLORIDE 10 MEQ/100ML IV SOLN
10.0000 meq | INTRAVENOUS | Status: AC
  Administered 2022-09-15 (×2): 10 meq via INTRAVENOUS
  Filled 2022-09-15 (×2): qty 100

## 2022-09-15 MED ORDER — LACTATED RINGERS IV BOLUS
1000.0000 mL | Freq: Once | INTRAVENOUS | Status: AC
  Administered 2022-09-15: 1000 mL via INTRAVENOUS

## 2022-09-15 MED ORDER — IOHEXOL 350 MG/ML SOLN
75.0000 mL | Freq: Once | INTRAVENOUS | Status: AC | PRN
  Administered 2022-09-15: 75 mL via INTRAVENOUS

## 2022-09-15 MED ORDER — MORPHINE SULFATE (PF) 4 MG/ML IV SOLN
4.0000 mg | Freq: Once | INTRAVENOUS | Status: AC
  Administered 2022-09-15: 4 mg via INTRAVENOUS
  Filled 2022-09-15: qty 1

## 2022-09-15 NOTE — ED Provider Notes (Signed)
Care assumed from Dr. Maylon Peppers, patient with abdominal pain, elevated lactate, AKI pending CTA abdomen/pelvis report. Will need to be admitted.  CT angiogram abdomen and pelvis does show evidence of colitis and no other acute process.  I have independently viewed the images, and agree with the radiologist interpretation.  Repeat lactic acid level has only partly come down, she likely will need more aggressive hydration.  Case is discussed with Dr. Wyline Copas of [facility], who agrees to admit the patient.  Results for orders placed or performed during the hospital encounter of 09/15/22  Resp Panel by RT-PCR (Flu A&B, Covid) Anterior Nasal Swab   Specimen: Anterior Nasal Swab  Result Value Ref Range   SARS Coronavirus 2 by RT PCR NEGATIVE NEGATIVE   Influenza A by PCR NEGATIVE NEGATIVE   Influenza B by PCR NEGATIVE NEGATIVE  CBC with Differential  Result Value Ref Range   WBC 5.1 4.0 - 10.5 K/uL   RBC 4.65 3.87 - 5.11 MIL/uL   Hemoglobin 11.1 (L) 12.0 - 15.0 g/dL   HCT 36.8 36.0 - 46.0 %   MCV 79.1 (L) 80.0 - 100.0 fL   MCH 23.9 (L) 26.0 - 34.0 pg   MCHC 30.2 30.0 - 36.0 g/dL   RDW 14.8 11.5 - 15.5 %   Platelets 259 150 - 400 K/uL   nRBC 0.0 0.0 - 0.2 %   Neutrophils Relative % 68 %   Neutro Abs 3.5 1.7 - 7.7 K/uL   Lymphocytes Relative 22 %   Lymphs Abs 1.1 0.7 - 4.0 K/uL   Monocytes Relative 8 %   Monocytes Absolute 0.4 0.1 - 1.0 K/uL   Eosinophils Relative 2 %   Eosinophils Absolute 0.1 0.0 - 0.5 K/uL   Basophils Relative 0 %   Basophils Absolute 0.0 0.0 - 0.1 K/uL   Immature Granulocytes 0 %   Abs Immature Granulocytes 0.02 0.00 - 0.07 K/uL  Comprehensive metabolic panel  Result Value Ref Range   Sodium 137 135 - 145 mmol/L   Potassium 3.4 (L) 3.5 - 5.1 mmol/L   Chloride 97 (L) 98 - 111 mmol/L   CO2 21 (L) 22 - 32 mmol/L   Glucose, Bld 124 (H) 70 - 99 mg/dL   BUN 26 (H) 8 - 23 mg/dL   Creatinine, Ser 1.75 (H) 0.44 - 1.00 mg/dL   Calcium 9.5 8.9 - 10.3 mg/dL   Total  Protein 7.3 6.5 - 8.1 g/dL   Albumin 4.1 3.5 - 5.0 g/dL   AST 29 15 - 41 U/L   ALT 13 0 - 44 U/L   Alkaline Phosphatase 62 38 - 126 U/L   Total Bilirubin 1.2 0.3 - 1.2 mg/dL   GFR, Estimated 29 (L) >60 mL/min   Anion gap 19 (H) 5 - 15  Lipase, blood  Result Value Ref Range   Lipase 47 11 - 51 U/L  Urinalysis, Routine w reflex microscopic Urine, Clean Catch  Result Value Ref Range   Color, Urine AMBER (A) YELLOW   APPearance CLOUDY (A) CLEAR   Specific Gravity, Urine 1.025 1.005 - 1.030   pH 5.0 5.0 - 8.0   Glucose, UA NEGATIVE NEGATIVE mg/dL   Hgb urine dipstick NEGATIVE NEGATIVE   Bilirubin Urine NEGATIVE NEGATIVE   Ketones, ur 5 (A) NEGATIVE mg/dL   Protein, ur >=300 (A) NEGATIVE mg/dL   Nitrite NEGATIVE NEGATIVE   Leukocytes,Ua NEGATIVE NEGATIVE   RBC / HPF 6-10 0 - 5 RBC/hpf   WBC, UA 11-20 0 -  5 WBC/hpf   Bacteria, UA FEW (A) NONE SEEN   Squamous Epithelial / LPF 6-10 0 - 5   Mucus PRESENT    Granular Casts, UA PRESENT    Non Squamous Epithelial 0-5 (A) NONE SEEN  Lactic acid, plasma  Result Value Ref Range   Lactic Acid, Venous 5.5 (HH) 0.5 - 1.9 mmol/L  Lactic acid, plasma  Result Value Ref Range   Lactic Acid, Venous 4.6 (HH) 0.5 - 1.9 mmol/L  Protime-INR  Result Value Ref Range   Prothrombin Time 13.7 11.4 - 15.2 seconds   INR 1.1 0.8 - 1.2  APTT  Result Value Ref Range   aPTT 27 24 - 36 seconds   CT Angio Abd/Pel W and/or Wo Contrast  Result Date: 09/15/2022 CLINICAL DATA:  Mesenteric ischemia, acute abdominal pain. EXAM: CTA ABDOMEN AND PELVIS WITHOUT AND WITH CONTRAST TECHNIQUE: Multidetector CT imaging of the abdomen and pelvis was performed using the standard protocol during bolus administration of intravenous contrast. Multiplanar reconstructed images and MIPs were obtained and reviewed to evaluate the vascular anatomy. RADIATION DOSE REDUCTION: This exam was performed according to the departmental dose-optimization program which includes automated  exposure control, adjustment of the mA and/or kV according to patient size and/or use of iterative reconstruction technique. CONTRAST:  39m OMNIPAQUE IOHEXOL 350 MG/ML SOLN COMPARISON:  09/06/2022. FINDINGS: VASCULAR Aorta: Aortic atherosclerosis. Normal caliber aorta without aneurysm, dissection, vasculitis or significant stenosis. Celiac: Patent without evidence of aneurysm, dissection, vasculitis or significant stenosis. SMA: Patent without evidence of aneurysm, dissection, vasculitis or significant stenosis. Renals: Both renal arteries are patent without evidence of aneurysm, dissection, vasculitis, fibromuscular dysplasia or significant stenosis. IMA: Patent without evidence of aneurysm, dissection, vasculitis or significant stenosis. Inflow: Patent without evidence of aneurysm, dissection, vasculitis or significant stenosis. Proximal Outflow: Bilateral common femoral and visualized portions of the superficial and profunda femoral arteries are patent without evidence of aneurysm, dissection, vasculitis or significant stenosis. Veins: No obvious venous abnormality within the limitations of this arterial phase study. Review of the MIP images confirms the above findings. NON-VASCULAR Lower chest: The heart is enlarged. Mild atelectasis is noted at the lung bases. Hepatobiliary: Subcentimeter hypodensities are present in the liver which are too small to further characterize. Mild intrahepatic biliary ductal dilatation which may be related to post cholecystectomy status. The common bile duct is within normal limits for patient's stated age. Pancreas: Unremarkable. No pancreatic ductal dilatation or surrounding inflammatory changes. Spleen: Normal in size without focal abnormality. Adrenals/Urinary Tract: The adrenal glands are within normal limits. The kidneys enhance symmetrically. No renal calculus or hydronephrosis. Bladder is unremarkable. Stomach/Bowel: There is a small hiatal hernia. Surgical changes are  noted at the stomach. Appendix appears normal. There is mild colonic wall thickening involving the transverse, descending, and sigmoid colon. No free air or pneumatosis. Scattered diverticula are present along the colon without evidence of diverticulitis. Lymphatic: No abdominal or pelvic lymphadenopathy. Reproductive: Status post hysterectomy. No adnexal masses. Other: No abdominopelvic ascites. Musculoskeletal: Degenerative changes in the thoracolumbar spine. Mild anterior wedging at L2, unchanged. No acute osseous abnormality. IMPRESSION: VASCULAR 1. No significant stenosis or arterial occlusion to suggest mesenteric ischemia. 2. Aortic atherosclerosis. NON-VASCULAR 1. Mild colonic wall thickening involving the transverse, descending, and sigmoid colon, possible colitis. 2. Diverticulosis without diverticulitis. 3. Small hiatal hernia. 4. Cardiomegaly. Electronically Signed   By: LBrett FairyM.D.   On: 09/15/2022 23:30   DG Abd Portable 1 View  Result Date: 09/15/2022 CLINICAL DATA:  Abdominal pain, nausea  and vomiting; eval for small bowel obstruction EXAM: PORTABLE ABDOMEN - 1 VIEW COMPARISON:  CT abdomen and pelvis 09/06/2022 FINDINGS: The bowel gas pattern is normal. Cholecystectomy clips. Sutures in the left upper quadrant related to Roux-en-Y gastric bypass. No radio-opaque calculi or other significant radiographic abnormality are seen. IMPRESSION: No acute abnormality. Electronically Signed   By: Placido Sou M.D.   On: 09/15/2022 21:34   DG Chest Port 1 View  Result Date: 09/15/2022 CLINICAL DATA:  Question sepsis EXAM: PORTABLE CHEST 1 VIEW COMPARISON:  Chest 01/14/2018 FINDINGS: Heart size upper normal. Vascularity normal. Lungs clear without infiltrate or effusion. No acute skeletal abnormality. IMPRESSION: No active disease. Electronically Signed   By: Franchot Gallo M.D.   On: 09/15/2022 20:00   CT ABDOMEN PELVIS W CONTRAST  Result Date: 09/06/2022 CLINICAL DATA:  Bowel obstruction  suspected abdominal pain EXAM: CT ABDOMEN AND PELVIS WITH CONTRAST TECHNIQUE: Multidetector CT imaging of the abdomen and pelvis was performed using the standard protocol following bolus administration of intravenous contrast. RADIATION DOSE REDUCTION: This exam was performed according to the departmental dose-optimization program which includes automated exposure control, adjustment of the mA and/or kV according to patient size and/or use of iterative reconstruction technique. CONTRAST:  16m OMNIPAQUE IOHEXOL 300 MG/ML  SOLN COMPARISON:  CT abdomen pelvis 06/04/2022 FINDINGS: Lower chest: Mild cardiomegaly, unchanged.  No acute abnormality. Hepatobiliary: Hepatic steatosis. The gallbladder is surgically absent. There is expected prominence of the intra extrahepatic bile ducts. Pancreas: No peripancreatic inflammatory change. Spleen: Unremarkable. Adrenals/Urinary Tract: Adrenal glands are unremarkable. No hydronephrosis or nephroureterolithiasis. The bladder is moderately distended. Stomach/Bowel: There is a gastrojejunostomy. No evidence of bowel obstruction.The appendix is normal. Colonic diverticulosis. There is mild thickening of the transverse colon, descending colon and partially the rectosigmoid colon with mild adjacent stranding. Vascular/Lymphatic: Tortuous abdominal aorta without aneurysm. There is ectasias of the right common iliac artery. Aortoiliac atherosclerosis. No lymphadenopathy. Reproductive: Prior hysterectomy. Other: No free air.  No abdominal wall hernia. Musculoskeletal: No acute osseous abnormality. No suspicious osseous lesion. Unchanged mild anterior wedging at L2. Mild osteoarthritis of the hips. IMPRESSION: Findings suggestive of colitis involving the transverse, descending, and rectosigmoid colon. No evidence of bowel obstruction.  Appendix is normal. Electronically Signed   By: JMaurine SimmeringM.D.   On: [id number]      GDelora Fuel MD [id number]

## 2022-09-15 NOTE — ED Provider Triage Note (Signed)
Emergency Medicine Provider Triage Evaluation Note  Colleen Medina , a 81 y.o. female  was evaluated in triage.  Pt complains of n/v/and diarrhea.  Has had nausea, vomiting, and diarrhea for 2 weeks now.  [facility] and had a CT scan that was notable for colitis.  She was put on antibiotics.  Reportedly, patient has not had any improvement.  She went to [facility] for a well visit today and she was sent to the emergency department.  She had a temperature of 96 at the office.  She still has generalized severe abdominal pain.  Review of Systems  Positive:  Negative:   Physical Exam  BP 116/68   Pulse 92   Resp (!) 30   Ht '4\' 11"'$  (1.499 m)   Wt 51.3 kg   SpO2 100%   BMI 22.82 kg/m  Gen:   Awake, no distress   Resp:  Normal effort  MSK:   Moves extremities without difficulty  Other:  Dry heaving actively in triage. Abdomen very tender to touch. + RLQ tenderness.   Medical Decision Making  Medically screening exam initiated at 5:45 PM.  Appropriate orders placed.  DAVI ROTAN was informed that the remainder of the evaluation will be completed by another provider, this initial triage assessment does not replace that evaluation, and the importance of remaining in the ED until their evaluation is complete.  Unfortunately unable to obtain accurate temperature here. Extremities are cool to touch. Room requested.    Colleen Medina, [place] 09/15/22 [phone]

## 2022-09-15 NOTE — Sepsis Progress Note (Signed)
[facility]link following Code Sepsis. 

## 2022-09-15 NOTE — ED Notes (Signed)
Unable to obtain temp

## 2022-09-15 NOTE — ED Provider Notes (Signed)
Patient has AKI with a decreased GFR of 29, however she will require IV contrast for CT imaging for concern for possible mesenteric ischemia for her abdominal pain with elevated lactate.  Patient's risks of surgical pathology including mesenteric ischemia outweighs her risk of contrast-induced nephropathy.  She is receiving IV fluids.   Kemper Durie, DO 09/15/22 2136

## 2022-09-15 NOTE — ED Triage Notes (Signed)
DX with colitis two weeks ago [facility].  Went to [facility] for well visit.  Staff reports patient got up and ran out the room and when they got her back in she stopped talking reports took antibiotics.  Still having n/v/d.  CBG 141 Patient to touch.  RLQ pain.

## 2022-09-15 NOTE — Sepsis Progress Note (Signed)
Notified provider of need to order antibiotics. Waiting on CT results first.

## 2022-09-15 NOTE — ED Provider Notes (Signed)
[facility]Y DEPART[facility] Provider Note   CSN: [id number] Arrival date & time: 09/15/22  1726     History  Chief Complaint  Patient presents with   Abdominal Pain    Colleen Medina is a 81 y.o. female.  Patient is a 81 year old female with a past medical history of hypertension,, diabetes, asthma and GERD presenting to the emergency department with abdominal pain.  The patient was initially seen in the emergency department about 10 days ago with abdominal pain and was diagnosed with colitis.  She was started on antibiotics and her symptoms had improved.  She had a follow-up appointment with her primary care doctor today and her pain acutely worsened today so her primary doctor recommended that she come to the emergency department.  She states that her pain is across her lower abdomen.  She states she has associated nausea and vomiting.  She states that her diarrhea has resolved but she is still passing gas.  She denies any fevers or chills.  The history is provided by the patient and a relative.  Abdominal Pain      Home Medications Prior to Admission medications   Medication Sig Start Date End Date Taking? Authorizing Provider  albuterol (PROAIR HFA) 108 (90 Base) MCG/ACT inhaler Inhale 1-2 puffs into the lungs every 6 (six) hours as needed for shortness of breath. 04/18/19   Sid Falcon, MD  aspirin 81 MG chewable tablet Chew 1 tablet (81 mg total) by mouth 2 (two) times daily with a meal. 02/25/22 04/11/22  Swinteck, Aaron Edelman, MD  azithromycin (ZITHROMAX) 250 MG tablet Take 1 tablet (250 mg total) by mouth daily. Take first 2 tablets together, then 1 every day until finished. 09/06/22   Gareth Morgan, MD  dicyclomine (BENTYL) 20 MG tablet Take 1 tablet (20 mg total) by mouth 2 (two) times daily as needed for spasms (abdominal pain). 09/06/22   Gareth Morgan, MD  docusate sodium (COLACE) 100 MG capsule Take 1 capsule (100 mg total) by mouth 2 (two)  times daily. 02/25/22   Swinteck, Aaron Edelman, MD  DULoxetine (CYMBALTA) 30 MG capsule Take 1 capsule (30 mg total) by mouth daily for 7 days, THEN 2 capsules (60 mg total) daily. Patient taking differently: Take 30 mg by mouth daily 02/24/21 02/24/22  Mitzi Hansen, MD  fluticasone (FLONASE) 50 MCG/ACT nasal spray Place 1 spray into both nostrils daily. 01/15/18   Ledell Noss, MD  lisinopril (ZESTRIL) 20 MG tablet Take 1 tablet by mouth once daily 04/22/20   Sid Falcon, MD  meloxicam (MOBIC) 15 MG tablet Take 15 mg by mouth daily.    [provider]  methocarbamol (ROBAXIN) 500 MG tablet Take 1 tablet (500 mg total) by mouth every 6 (six) hours as needed for muscle spasms. 02/25/22   Swinteck, Aaron Edelman, MD  omeprazole (PRILOSEC) 40 MG capsule Take 40 mg by mouth daily. 09/07/21   [provider]  ondansetron (ZOFRAN) 8 MG tablet Take 1 tablet (8 mg total) by mouth every 8 (eight) hours as needed for nausea or vomiting. 06/04/22   Molpus, Jenny Reichmann, MD  ondansetron (ZOFRAN-ODT) 4 MG disintegrating tablet Take 1 tablet (4 mg total) by mouth every 8 (eight) hours as needed for nausea or vomiting. 09/06/22   Gareth Morgan, MD  oxyCODONE-acetaminophen (PERCOCET/ROXICET) 5-325 MG tablet Take 1 tablet by mouth every 4 (four) hours as needed for severe pain. 02/25/22   Swinteck, Aaron Edelman, MD  polyethylene glycol (MIRALAX / GLYCOLAX) 17 g packet Take  17 g by mouth daily as needed for mild constipation. 02/25/22   Swinteck, Aaron Edelman, MD  rosuvastatin (CRESTOR) 40 MG tablet Take 40 mg by mouth daily.    [provider]  senna (SENOKOT) 8.6 MG TABS tablet Take 2 tablets (17.2 mg total) by mouth at bedtime. 02/25/22   Swinteck, Aaron Edelman, MD      Allergies    Sulfonamide derivatives, Sulfa antibiotics, and Penicillins    Review of Systems   Review of Systems  Gastrointestinal:  Positive for abdominal pain.    Physical Exam Updated Vital Signs BP (!) 142/75   Pulse 79   Temp 98.3 F (36.8 C) (Rectal)    Resp (!) 25   Ht '4\' 11"'$  (1.499 m)   Wt 51.3 kg   SpO2 100%   BMI 22.82 kg/m  Physical Exam Vitals and nursing note reviewed.  Constitutional:      General: She is in acute distress.     Appearance: She is ill-appearing. She is not diaphoretic.     Comments: Writhing around in pain, dry heaving on exam  HENT:     Head: Normocephalic and atraumatic.     Mouth/Throat:     Comments: Dry mucous membranes Eyes:     Extraocular Movements: Extraocular movements intact.  Cardiovascular:     Rate and Rhythm: Normal rate and regular rhythm.     Heart sounds: Normal heart sounds.  Pulmonary:     Effort: Pulmonary effort is normal.     Breath sounds: Normal breath sounds.  Abdominal:     General: Abdomen is flat.     Palpations: Abdomen is soft.     Tenderness: There is abdominal tenderness in the right lower quadrant, suprapubic area and left lower quadrant. There is no guarding or rebound.  Skin:    General: Skin is warm and dry.  Neurological:     General: No focal deficit present.     Mental Status: She is alert and oriented to person, place, and time.  Psychiatric:        Mood and Affect: Mood normal.        Behavior: Behavior normal.     ED Results / Procedures / Treatments   Labs (all labs ordered are listed, but only abnormal results are displayed) Labs Reviewed  CBC WITH DIFFERENTIAL/PLATELET - Abnormal; Notable for the following components:      Result Value   Hemoglobin 11.1 (*)    MCV 79.1 (*)    MCH 23.9 (*)    All other components within normal limits  COMPREHENSIVE METABOLIC PANEL - Abnormal; Notable for the following components:   Potassium 3.4 (*)    Chloride 97 (*)    CO2 21 (*)    Glucose, Bld 124 (*)    BUN 26 (*)    Creatinine, Ser 1.75 (*)    GFR, Estimated 29 (*)    Anion gap 19 (*)    All other components within normal limits  LACTIC ACID, PLASMA - Abnormal; Notable for the following components:   Lactic Acid, Venous 5.5 (*)    All other  components within normal limits  URINE CULTURE  CULTURE, BLOOD (ROUTINE X 2)  CULTURE, BLOOD (ROUTINE X 2)  RESP PANEL BY RT-PCR (FLU A&B, COVID) ARPGX2  GASTROINTESTINAL PANEL BY PCR, STOOL (REPLACES STOOL CULTURE)  C DIFFICILE QUICK SCREEN W PCR REFLEX    LIPASE, BLOOD  PROTIME-INR  URINALYSIS, ROUTINE W REFLEX MICROSCOPIC  LACTIC ACID, PLASMA  APTT  EKG EKG Interpretation  Date/Time:  Thursday September 15 2022 18:19:43 EDT Ventricular Rate:  86 PR Interval:  126 QRS Duration: 70 QT Interval:  454 QTC Calculation: 543 R Axis:   7 Text Interpretation: Normal sinus rhythm Nonspecific T wave abnormality Prolonged QT Abnormal ECG No significant change since last tracing Confirmed by Leanord Asal ([id number]) on 09/15/2022 7:42:59 PM  Radiology DG Chest Port 1 View  Result Date: 09/15/2022 CLINICAL DATA:  Question sepsis EXAM: PORTABLE CHEST 1 VIEW COMPARISON:  Chest 01/14/2018 FINDINGS: Heart size upper normal. Vascularity normal. Lungs clear without infiltrate or effusion. No acute skeletal abnormality. IMPRESSION: No active disease. Electronically Signed   By: Franchot Gallo M.D.   On: 09/15/2022 20:00    Procedures Procedures    Medications Ordered in ED Medications  lactated ringers infusion ( Intravenous New Bag/Given 09/15/22 2004)  potassium chloride 10 mEq in 100 mL IVPB (10 mEq Intravenous New Bag/Given 09/15/22 2025)  ondansetron (ZOFRAN) injection 4 mg (4 mg Intravenous Given 09/15/22 1810)  morphine (PF) 2 MG/ML injection 4 mg (4 mg Intravenous Given 09/15/22 1810)  lactated ringers bolus 1,000 mL (1,000 mLs Intravenous New Bag/Given 09/15/22 1936)    And  lactated ringers bolus 500 mL (500 mLs Intravenous New Bag/Given 09/15/22 2003)    And  lactated ringers bolus 250 mL (250 mLs Intravenous New Bag/Given 09/15/22 2003)  morphine (PF) 4 MG/ML injection 4 mg (4 mg Intravenous Given 09/15/22 2023)  ondansetron (ZOFRAN) injection 4 mg (4 mg Intravenous Given 09/15/22  2022)    ED Course/ Medical Decision Making/ A&P Clinical Course as of 09/15/22 2352  Thu Sep 15, 2022  2048 Urine with few bacteria, but multiple squames, otherwise negative making UTI unlikely.  CT imaging is pending at this time. [VK]  2141 Has persistent lactic elevation abdominal x-ray showed no obvious signs of SBO.  I am concerned for possible mesenteric ischemia with her pain out of proportion to exam with lactic acidosis and she will have a CT angio of her abdomen and pelvis performed. [VK]    Clinical Course User Index [VK] Kemper Durie, DO                           Medical Decision Making This patient presents to the ED with chief complaint(s) of abdominal pain with pertinent past medical history of Roux-en-Y, cholecystectomy, diabetes, hypertension, GERD recent colitis which further complicates the presenting complaint. The complaint involves an extensive differential diagnosis and also carries with it a high risk of complications and morbidity.    The differential diagnosis includes C. difficile colitis, toxic megacolon, diverticulitis, appendicitis, SBO, UTI, pyelonephritis, sepsis  Additional history obtained: Additional history obtained from family Records reviewed reviewed ED records  ED Course and Reassessment: She was initially evaluated by provider in triage and had labs performed and was given morphine and Zofran for symptoms and started on IV fluids.  The patient had mild relief initially from the morphine and Zofran but upon my evaluation her pain and nausea began worsening.  Her labs are significant for an elevated lactate of 5 and she is receiving IV fluids.  Repeat lactate will be sent.  She does have a new AKI with a decreased GFR so CT without contrast will be performed.  Stool studies will be sent if she is able to have a bowel movement here.  She will continue to be closely monitored.  Independent labs interpretation:  The following labs  were  independently interpreted: persistently elevated lactate, AKI  Independent visualization of imaging: - I independently visualized the following imaging with scope of interpretation limited to determining acute life threatening conditions related to emergency care: CXR, Abd XR, CTAP pending, which revealed NO acute disease      Amount and/or Complexity of Data Reviewed Radiology: ordered.  Risk Prescription drug management.   Patient signed out to Dr. Roxanne Mins pending CT read and reassessment.        Final Clinical Impression(s) / ED Diagnoses Final diagnoses:  None    Rx / DC Orders ED Discharge Orders     None         Kemper Durie, DO 09/15/22 2352

## 2022-09-15 NOTE — ED Notes (Signed)
Elevated lactic acid reported to PA at triage .

## 2022-09-16 DIAGNOSIS — Z6822 Body mass index (BMI) 22.0-22.9, adult: Secondary | ICD-10-CM | POA: Diagnosis not present

## 2022-09-16 DIAGNOSIS — Z8249 Family history of ischemic heart disease and other diseases of the circulatory system: Secondary | ICD-10-CM | POA: Diagnosis not present

## 2022-09-16 DIAGNOSIS — I10 Essential (primary) hypertension: Secondary | ICD-10-CM

## 2022-09-16 DIAGNOSIS — K648 Other hemorrhoids: Secondary | ICD-10-CM | POA: Diagnosis present

## 2022-09-16 DIAGNOSIS — D509 Iron deficiency anemia, unspecified: Secondary | ICD-10-CM | POA: Diagnosis present

## 2022-09-16 DIAGNOSIS — E872 Acidosis, unspecified: Secondary | ICD-10-CM | POA: Diagnosis present

## 2022-09-16 DIAGNOSIS — N179 Acute kidney failure, unspecified: Secondary | ICD-10-CM | POA: Diagnosis present

## 2022-09-16 DIAGNOSIS — K5730 Diverticulosis of large intestine without perforation or abscess without bleeding: Secondary | ICD-10-CM | POA: Diagnosis present

## 2022-09-16 DIAGNOSIS — E860 Dehydration: Secondary | ICD-10-CM | POA: Diagnosis present

## 2022-09-16 DIAGNOSIS — K529 Noninfective gastroenteritis and colitis, unspecified: Secondary | ICD-10-CM | POA: Diagnosis present

## 2022-09-16 DIAGNOSIS — M797 Fibromyalgia: Secondary | ICD-10-CM | POA: Diagnosis present

## 2022-09-16 DIAGNOSIS — M8580 Other specified disorders of bone density and structure, unspecified site: Secondary | ICD-10-CM | POA: Diagnosis present

## 2022-09-16 DIAGNOSIS — Z96651 Presence of right artificial knee joint: Secondary | ICD-10-CM | POA: Diagnosis present

## 2022-09-16 DIAGNOSIS — E119 Type 2 diabetes mellitus without complications: Secondary | ICD-10-CM | POA: Diagnosis present

## 2022-09-16 DIAGNOSIS — Z880 Allergy status to penicillin: Secondary | ICD-10-CM | POA: Diagnosis not present

## 2022-09-16 DIAGNOSIS — I739 Peripheral vascular disease, unspecified: Secondary | ICD-10-CM | POA: Diagnosis present

## 2022-09-16 DIAGNOSIS — K644 Residual hemorrhoidal skin tags: Secondary | ICD-10-CM | POA: Diagnosis present

## 2022-09-16 DIAGNOSIS — Z1152 Encounter for screening for COVID-19: Secondary | ICD-10-CM | POA: Diagnosis not present

## 2022-09-16 DIAGNOSIS — Z79891 Long term (current) use of opiate analgesic: Secondary | ICD-10-CM | POA: Diagnosis not present

## 2022-09-16 DIAGNOSIS — E876 Hypokalemia: Secondary | ICD-10-CM | POA: Diagnosis present

## 2022-09-16 DIAGNOSIS — E440 Moderate protein-calorie malnutrition: Secondary | ICD-10-CM | POA: Diagnosis present

## 2022-09-16 DIAGNOSIS — I700 Atherosclerosis of aorta: Secondary | ICD-10-CM | POA: Diagnosis present

## 2022-09-16 DIAGNOSIS — E785 Hyperlipidemia, unspecified: Secondary | ICD-10-CM | POA: Diagnosis present

## 2022-09-16 LAB — CBC
HCT: 31.3 % — ABNORMAL LOW (ref 36.0–46.0)
Hemoglobin: 9.4 g/dL — ABNORMAL LOW (ref 12.0–15.0)
MCH: 24.1 pg — ABNORMAL LOW (ref 26.0–34.0)
MCHC: 30 g/dL (ref 30.0–36.0)
MCV: 80.3 fL (ref 80.0–100.0)
Platelets: 182 10*3/uL (ref 150–400)
RBC: 3.9 MIL/uL (ref 3.87–5.11)
RDW: 14.8 % (ref 11.5–15.5)
WBC: 6.1 10*3/uL (ref 4.0–10.5)
nRBC: 0 % (ref 0.0–0.2)

## 2022-09-16 LAB — COMPREHENSIVE METABOLIC PANEL
ALT: 11 U/L (ref 0–44)
AST: 23 U/L (ref 15–41)
Albumin: 2.9 g/dL — ABNORMAL LOW (ref 3.5–5.0)
Alkaline Phosphatase: 53 U/L (ref 38–126)
Anion gap: 9 (ref 5–15)
BUN: 19 mg/dL (ref 8–23)
CO2: 27 mmol/L (ref 22–32)
Calcium: 8.3 mg/dL — ABNORMAL LOW (ref 8.9–10.3)
Chloride: 99 mmol/L (ref 98–111)
Creatinine, Ser: 1.36 mg/dL — ABNORMAL HIGH (ref 0.44–1.00)
GFR, Estimated: 40 mL/min — ABNORMAL LOW (ref >60)
Glucose, Bld: 95 mg/dL (ref 70–99)
Potassium: 3.3 mmol/L — ABNORMAL LOW (ref 3.5–5.1)
Sodium: 135 mmol/L (ref 135–145)
Total Bilirubin: 0.6 mg/dL (ref 0.3–1.2)
Total Protein: 5.4 g/dL — ABNORMAL LOW (ref 6.5–8.1)

## 2022-09-16 LAB — URINE CULTURE: Culture: <10000 — AB

## 2022-09-16 LAB — LACTIC ACID, PLASMA: Lactic Acid, Venous: 1.5 mmol/L (ref 0.5–1.9)

## 2022-09-16 MED ORDER — METRONIDAZOLE 500 MG/100ML IV SOLN
500.0000 mg | Freq: Two times a day (BID) | INTRAVENOUS | Status: DC
  Administered 2022-09-16 – 2022-09-20 (×8): 500 mg via INTRAVENOUS
  Filled 2022-09-16 (×8): qty 100

## 2022-09-16 MED ORDER — ONDANSETRON HCL 4 MG/2ML IJ SOLN
4.0000 mg | Freq: Four times a day (QID) | INTRAMUSCULAR | Status: DC | PRN
  Administered 2022-09-17 – 2022-09-19 (×4): 4 mg via INTRAVENOUS
  Filled 2022-09-16 (×4): qty 2

## 2022-09-16 MED ORDER — POTASSIUM CHLORIDE CRYS ER 20 MEQ PO TBCR
60.0000 meq | EXTENDED_RELEASE_TABLET | Freq: Once | ORAL | Status: AC
  Administered 2022-09-16: 60 meq via ORAL
  Filled 2022-09-16: qty 3

## 2022-09-16 MED ORDER — ROSUVASTATIN CALCIUM 20 MG PO TABS
40.0000 mg | ORAL_TABLET | Freq: Every day | ORAL | Status: DC
  Administered 2022-09-16 – 2022-09-21 (×6): 40 mg via ORAL
  Filled 2022-09-16 (×7): qty 2

## 2022-09-16 MED ORDER — ACETAMINOPHEN 650 MG RE SUPP: 650.0000 mg | Freq: Four times a day (QID) | RECTAL | Status: DC | PRN

## 2022-09-16 MED ORDER — ALBUTEROL SULFATE (2.5 MG/3ML) 0.083% IN NEBU: 2.5000 mg | INHALATION_SOLUTION | Freq: Four times a day (QID) | RESPIRATORY_TRACT | Status: DC | PRN

## 2022-09-16 MED ORDER — ENOXAPARIN SODIUM 30 MG/0.3ML IJ SOSY
30.0000 mg | PREFILLED_SYRINGE | Freq: Every day | INTRAMUSCULAR | Status: DC
  Administered 2022-09-16 – 2022-09-21 (×6): 30 mg via SUBCUTANEOUS
  Filled 2022-09-16 (×6): qty 0.3

## 2022-09-16 MED ORDER — ACETAMINOPHEN 325 MG PO TABS: 650.0000 mg | ORAL_TABLET | Freq: Four times a day (QID) | ORAL | Status: DC | PRN

## 2022-09-16 MED ORDER — OXYCODONE-ACETAMINOPHEN 5-325 MG PO TABS
1.0000 | ORAL_TABLET | ORAL | Status: DC | PRN
  Administered 2022-09-17 – 2022-09-19 (×4): 1 via ORAL
  Filled 2022-09-16 (×4): qty 1

## 2022-09-16 MED ORDER — SODIUM CHLORIDE 0.9 % IV SOLN
2.0000 g | Freq: Every day | INTRAVENOUS | Status: DC
  Administered 2022-09-16 – 2022-09-19 (×5): 2 g via INTRAVENOUS
  Filled 2022-09-16 (×5): qty 20

## 2022-09-16 MED ORDER — LACTATED RINGERS IV SOLN: INTRAVENOUS | Status: DC

## 2022-09-16 MED ORDER — POTASSIUM CHLORIDE 10 MEQ/100ML IV SOLN
10.0000 meq | INTRAVENOUS | Status: AC
  Administered 2022-09-16 (×2): 10 meq via INTRAVENOUS
  Filled 2022-09-16 (×2): qty 100

## 2022-09-16 MED ORDER — MORPHINE SULFATE (PF) 2 MG/ML IV SOLN: 2.0000 mg | INTRAVENOUS | Status: DC | PRN
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
## 2022-09-16 NOTE — Progress Notes (Signed)
Colleen Medina is a 81 y.o. female with medical history significant of HTN, HLD, recently treated colitis who presents to ed with recurrent abd pain. Pt was see in ED 10 days prior with similar complaints, found to have evidence of acute colitis and discharged home with azithromycin and antiemetics. Pt has a documented PCN allergy.    On further questioning, symptoms began acutely 10 days prior. Denies any unusual or poorly cooked foods. Denies sick contacts. Pt reported feeling somewhat better after starting course of azithromycin from last vist, however, abd discomfort, n/v/d worsened. Family reports pt has lost noticeable wt over past 10 days.    In ED, pt noted to have lactate of 5.5, improved to 4.6 with IVF. Cr elevated at 1.75. Pt was given analgesia and fluid boluses.  CT abdomen and pelvis revealed possible colitis involving the transverse, descending and sigmoid colon, diverticulosis without diverticulitis, no significant stenosis or arterial occlusion to suggest mesenteric ischemia.  She was started on Rocephin in the ED, IV Flagyl added.  09/16/2022: The patient was seen and examined with her daughter at her bedside.  Reports mild diffuse abdominal pain.  Associated with nausea and vomiting.  IV antibiotics, gentle IV fluids and IV antiemetics in place.  Physical exam:  Frail in no acute distress alert and uncomfortable appearing due to abdominal pain. Regular rate and rhythm no rubs or gallops. Lungs are clear to auscultation Diffuse abdominal tenderness to palpation.  Bowel sounds present No lower extremity edema Mood is appropriate for condition and setting.  Possible colitis Treated with Rocephin and IV Flagyl Gentle IV fluid hydration LR 50 cc/h  Hypokalemia Potassium 3.3 Treated with KCl 10 mEq IV x2 doses  AKI Baseline creatinine 0.7 with GFR greater than 60 presented with creatinine of 1.75 with GFR of 29 Avoid nephrotoxic agents, dehydration and hypotension Repeat  renal panel in the morning.  Generalized weakness PT OT to assess Fall precautions   DVT prophylaxis: Subcu Lovenox daily

## 2022-09-17 DIAGNOSIS — K529 Noninfective gastroenteritis and colitis, unspecified: Secondary | ICD-10-CM | POA: Diagnosis not present

## 2022-09-17 LAB — CBC WITH DIFFERENTIAL/PLATELET
Abs Immature Granulocytes: 0.01 10*3/uL (ref 0.00–0.07)
Basophils Absolute: 0 10*3/uL (ref 0.0–0.1)
Basophils Relative: 0 %
Eosinophils Absolute: 0.2 10*3/uL (ref 0.0–0.5)
Eosinophils Relative: 4 %
HCT: 31.5 % — ABNORMAL LOW (ref 36.0–46.0)
Hemoglobin: 9.7 g/dL — ABNORMAL LOW (ref 12.0–15.0)
Immature Granulocytes: 0 %
Lymphocytes Relative: 23 %
Lymphs Abs: 0.9 10*3/uL (ref 0.7–4.0)
MCH: 24.3 pg — ABNORMAL LOW (ref 26.0–34.0)
MCHC: 30.8 g/dL (ref 30.0–36.0)
MCV: 78.9 fL — ABNORMAL LOW (ref 80.0–100.0)
Monocytes Absolute: 0.4 10*3/uL (ref 0.1–1.0)
Monocytes Relative: 9 %
Neutro Abs: 2.6 10*3/uL (ref 1.7–7.7)
Neutrophils Relative %: 64 %
Platelets: 201 10*3/uL (ref 150–400)
RBC: 3.99 MIL/uL (ref 3.87–5.11)
RDW: 15.1 % (ref 11.5–15.5)
WBC: 4 10*3/uL (ref 4.0–10.5)
nRBC: 0 % (ref 0.0–0.2)

## 2022-09-17 LAB — RENAL FUNCTION PANEL
Albumin: 3 g/dL — ABNORMAL LOW (ref 3.5–5.0)
Anion gap: 10 (ref 5–15)
BUN: 8 mg/dL (ref 8–23)
CO2: 26 mmol/L (ref 22–32)
Calcium: 8.9 mg/dL (ref 8.9–10.3)
Chloride: 102 mmol/L (ref 98–111)
Creatinine, Ser: 1.28 mg/dL — ABNORMAL HIGH (ref 0.44–1.00)
GFR, Estimated: 43 mL/min — ABNORMAL LOW (ref >60)
Glucose, Bld: 106 mg/dL — ABNORMAL HIGH (ref 70–99)
Phosphorus: 2.8 mg/dL (ref 2.5–4.6)
Potassium: 4.1 mmol/L (ref 3.5–5.1)
Sodium: 138 mmol/L (ref 135–145)

## 2022-09-17 LAB — LIPASE, BLOOD: Lipase: 30 U/L (ref 11–51)

## 2022-09-17 LAB — MAGNESIUM: Magnesium: 1.5 mg/dL — ABNORMAL LOW (ref 1.7–2.4)

## 2022-09-17 LAB — C-REACTIVE PROTEIN: CRP: 0.6 mg/dL (ref <1.0)

## 2022-09-17 MED ORDER — POTASSIUM CHLORIDE 2 MEQ/ML IV SOLN
INTRAVENOUS | Status: AC
  Filled 2022-09-17 (×2): qty 1000

## 2022-09-17 MED ORDER — MAGNESIUM SULFATE 4 GM/100ML IV SOLN
4.0000 g | Freq: Once | INTRAVENOUS | Status: AC
  Administered 2022-09-17: 4 g via INTRAVENOUS
  Filled 2022-09-17: qty 100

## 2022-09-17 MED ORDER — PANTOPRAZOLE SODIUM 40 MG PO TBEC
40.0000 mg | DELAYED_RELEASE_TABLET | Freq: Every day | ORAL | Status: DC
  Administered 2022-09-17 – 2022-09-21 (×5): 40 mg via ORAL
  Filled 2022-09-17 (×5): qty 1

## 2022-09-17 MED ORDER — POTASSIUM CHLORIDE CRYS ER 20 MEQ PO TBCR
40.0000 meq | EXTENDED_RELEASE_TABLET | Freq: Once | ORAL | Status: AC
  Administered 2022-09-17: 40 meq via ORAL
  Filled 2022-09-17: qty 2

## 2022-09-17 MED ORDER — BOOST / RESOURCE BREEZE PO LIQD CUSTOM
1.0000 | Freq: Three times a day (TID) | ORAL | Status: DC
  Administered 2022-09-18 – 2022-09-20 (×4): 1 via ORAL

## 2022-09-17 NOTE — Plan of Care (Signed)
  Problem: Education: Goal: Knowledge of General Education information will improve Description: Including pain rating scale, medication(s)/side effects and non-pharmacologic comfort measures Outcome: Progressing   Problem: Clinical Measurements: Goal: Ability to maintain clinical measurements within normal limits will improve Outcome: Progressing Goal: Will remain free from infection Outcome: Progressing Goal: Diagnostic test results will improve Outcome: Progressing Goal: Respiratory complications will improve Outcome: Progressing Goal: Cardiovascular complication will be avoided Outcome: Progressing   Problem: Activity: Goal: Risk for activity intolerance will decrease Outcome: Not Progressing   Problem: Nutrition: Goal: Adequate nutrition will be maintained Outcome: Progressing   Problem: Coping: Goal: Level of anxiety will decrease Outcome: Progressing   Problem: Elimination: Goal: Will not experience complications related to bowel motility Outcome: Progressing Goal: Will not experience complications related to urinary retention Outcome: Progressing   Problem: Pain Managment: Goal: General experience of comfort will improve Outcome: Progressing   Problem: Safety: Goal: Ability to remain free from injury will improve Outcome: Progressing   Problem: Skin Integrity: Goal: Risk for impaired skin integrity will decrease Outcome: Progressing

## 2022-09-17 NOTE — Evaluation (Signed)
Physical Therapy Evaluation and Discharge Patient Details Name: Colleen Medina MRN: [id number] DOB: 1941/07/22 Today's Date: 09/17/2022  History of Present Illness  81 y.o. female admited with recurrent abd pain and some nausea vomiting.  CT scan revealed nonspecific coliti.  Medical history significant of HTN, HLD.  She has had the symptoms for about 10 days and was previously treated with a course of azithromycin and antiemetics with minimal to no improvement, she also has had the symptoms about once or twice every couple of years.  Clinical Impression   Patient evaluated by Physical Therapy with no further acute PT needs identified. Patient lives with family and was independent with all mobility PTA. Patient nearly completed entire DGI (minus stairs) and scored perfectly on each item.  PT is signing off. Thank you for this referral.        Recommendations for follow up therapy are one component of a multi-disciplinary discharge planning process, led by the attending physician.  Recommendations may be updated based on patient status, additional functional criteria and insurance authorization.  Follow Up Recommendations No PT follow up      Assistance Recommended at Discharge None  Patient can return home with the following       Equipment Recommendations None recommended by PT  Recommendations for Other Services       Functional Status Assessment Patient has not had a recent decline in their functional status     Precautions / Restrictions Precautions Precautions: Fall Restrictions Weight Bearing Restrictions: No      Mobility  Bed Mobility               General bed mobility comments: up in chair    Transfers Overall transfer level: Independent Equipment used: None               General transfer comment: No LOB noted with sit to stand from chair or standard toilet    Ambulation/Gait Ambulation/Gait assistance: Independent Gait Distance (Feet): 400  Feet Assistive device: None Gait Pattern/deviations: WFL(Within Functional Limits)   Gait velocity interpretation: >2.62 ft/sec, indicative of community ambulatory      Stairs            Wheelchair Mobility    Modified Rankin (Stroke Patients Only)       Balance Overall balance assessment: Independent                               Standardized Balance Assessment Standardized Balance Assessment : Dynamic Gait Index   Dynamic Gait Index Level Surface: Normal Change in Gait Speed: Normal Gait with Horizontal Head Turns: Normal Gait with Vertical Head Turns: Normal Gait and Pivot Turn: Normal Step Over Obstacle: Normal Step Around Obstacles: Normal       Pertinent Vitals/Pain Pain Assessment Pain Assessment: Faces Faces Pain Scale: Hurts even more Pain Location: IV site Pain Descriptors / Indicators: Burning Pain Intervention(s): Limited activity within patient's tolerance, Monitored during session, Other (comment) (Notified RN)    Home Living Family/patient expects to be discharged to:: Private residence Living Arrangements: Children (daughter and grandchildren, sometimes she is alone) Available Help at Discharge: Family Type of Home: House Home Access: Stairs to enter Entrance Stairs-Rails: Right Entrance Stairs-Number of Steps: 3   Home Layout: One level Home Equipment: Conservation officer, nature (2 wheels);Cane - single point;Grab bars - tub/shower;Shower seat Additional Comments: pt has a lot of equipment that was her husbands    Prior Function  Prior Level of Function : Independent/Modified Independent             Mobility Comments: independent for mobility ADLs Comments: independent     Hand Dominance   Dominant Hand: Right    Extremity/Trunk Assessment   Upper Extremity Assessment Upper Extremity Assessment: Overall WFL for tasks assessed    Lower Extremity Assessment Lower Extremity Assessment: Overall WFL for tasks assessed     Cervical / Trunk Assessment Cervical / Trunk Assessment: Normal  Communication   Communication: No difficulties  Cognition Arousal/Alertness: Awake/alert Behavior During Therapy: WFL for tasks assessed/performed Overall Cognitive Status: Within Functional Limits for tasks assessed                                          General Comments      Exercises     Assessment/Plan    PT Assessment Patient does not need any further PT services  PT Problem List         PT Treatment Interventions      PT Goals (Current goals can be found in the Care Plan section)  Acute Rehab PT Goals PT Goal Formulation: All assessment and education complete, DC therapy    Frequency       Co-evaluation               AM-PAC PT "6 Clicks" Mobility  Outcome Measure Help needed turning from your back to your side while in a flat bed without using bedrails?: None Help needed moving from lying on your back to sitting on the side of a flat bed without using bedrails?: None Help needed moving to and from a bed to a chair (including a wheelchair)?: None Help needed standing up from a chair using your arms (e.g., wheelchair or bedside chair)?: None Help needed to walk in hospital room?: None Help needed climbing 3-5 steps with a railing? : None 6 Click Score: 24    End of Session   Activity Tolerance: Patient tolerated treatment well Patient left: in chair;with call bell/phone within reach Nurse Communication: Mobility status;Other (comment) (IV burning worse) PT Visit Diagnosis: Difficulty in walking, not elsewhere classified (R26.2)    Time: 5621-3086 PT Time Calculation (min) (ACUTE ONLY): 24 min   Charges:   PT Evaluation $PT Eval Low Complexity: [place], PT Acute Rehabilitation Services  Office [phone]   Rexanne Mano 09/17/2022, 1:37 PM

## 2022-09-17 NOTE — Evaluation (Signed)
Occupational Therapy Evaluation Patient Details Name: Colleen Medina MRN: [id number] DOB: 08/23/1941 Today's Date: 09/17/2022   History of Present Illness 81 y.o. female admited with recurrent abd pain and some nausea vomiting.  CT scan revealed nonspecific coliti.  Medical history significant of HTN, HLD.  She has had the symptoms for about 10 days and was previously treated with a course of azithromycin and antiemetics with minimal to no improvement, she also has had the symptoms about once or twice every couple of years.   Clinical Impression   Pt currently modified independent for simulated selfcare tasks and transfers in the room simulated.  She has some assist at home from her daughter and grandsons as needed.  Feel she is at baseline for selfcare tasks and has needed DME for showering.  No further OT needs at this time.        Recommendations for follow up therapy are one component of a multi-disciplinary discharge planning process, led by the attending physician.  Recommendations may be updated based on patient status, additional functional criteria and insurance authorization.   Follow Up Recommendations  No OT follow up    Assistance Recommended at Discharge PRN  Patient can return home with the following Assistance with cooking/housework    Functional Status Assessment  Patient has not had a recent decline in their functional status  Equipment Recommendations  None recommended by OT       Precautions / Restrictions Precautions Precautions: Fall Restrictions Weight Bearing Restrictions: No      Mobility Bed Mobility Overal bed mobility: Needs Assistance Bed Mobility: Supine to Sit     Supine to sit: Independent          Transfers Overall transfer level: Independent Equipment used: None               General transfer comment: No LOB noted with sit to stand or functional mobility in the room/hallway briefly      Balance Overall balance assessment:  Mild deficits observed, not formally tested                                         ADL either performed or assessed with clinical judgement   ADL Overall ADL's : Modified independent Eating/Feeding: Independent                                     General ADL Comments: Pt currently independent to modified independent for selfcare tasks and transfers without use of an assistive device this session.  She has a shower seat at home and PRN supervision from her daughter and grandsons as needed.  No other OT needs at this time.     Vision Baseline Vision/History: 1 Wears glasses Ability to See in Adequate Light: 0 Adequate Patient Visual Report: No change from baseline Vision Assessment?: No apparent visual deficits     Perception Perception Perception: Within Functional Limits   Praxis Praxis Praxis: Intact    Pertinent Vitals/Pain Pain Assessment Pain Assessment: 0-10 Pain Score: 4  Pain Location: right knee Pain Descriptors / Indicators: Discomfort Pain Intervention(s): Limited activity within patient's tolerance, Premedicated before session, Monitored during session        Extremity/Trunk Assessment Upper Extremity Assessment Upper Extremity Assessment: Overall WFL for tasks assessed   Lower Extremity Assessment Lower Extremity Assessment:  Defer to PT evaluation   Cervical / Trunk Assessment Cervical / Trunk Assessment: Normal   Communication Communication Communication: No difficulties   Cognition Arousal/Alertness: Awake/alert Behavior During Therapy: WFL for tasks assessed/performed Overall Cognitive Status: Within Functional Limits for tasks assessed                                                  Home Living Family/patient expects to be discharged to:: Private residence Living Arrangements: Children (daughter and grandchildren, sometimes she is alone) Available Help at Discharge: Family Type of Home:  House Home Access: Stairs to enter Technical brewer of Steps: 3 Entrance Stairs-Rails: Right Home Layout: One level     Bathroom Shower/Tub: Occupational psychologist: Standard     Home Equipment: Conservation officer, nature (2 wheels);Cane - single point;Grab bars - tub/shower;Shower seat          Prior Functioning/Environment Prior Level of Function : Independent/Modified Independent             Mobility Comments: independent for mobility ADLs Comments: independent                           AM-PAC OT "6 Clicks" Daily Activity     Outcome Measure Help from another person eating meals?: None Help from another person taking care of personal grooming?: None Help from another person toileting, which includes using toliet, bedpan, or urinal?: None Help from another person bathing (including washing, rinsing, drying)?: None Help from another person to put on and taking off regular upper body clothing?: None Help from another person to put on and taking off regular lower body clothing?: None 6 Click Score: 24   End of Session Equipment Utilized During Treatment: Gait belt Nurse Communication: Mobility status  Activity Tolerance: Patient tolerated treatment well Patient left: with call bell/phone within reach;in chair                   Time: 9798-9211 OT Time Calculation (min): 32 min Charges:  OT General Charges $OT Visit: 1 Visit OT Evaluation $OT Eval Low Complexity: 1 Low OT Treatments $Self Care/Home Management : 8-22 mins Arn Mcomber OTR/L 09/17/2022, 12:43 PM

## 2022-09-17 NOTE — Progress Notes (Signed)
PROGRESS NOTE                                                                                                                                                                                                             Patient Demographics:    Colleen Medina, is a 81 y.o. female, DOB - 04-Jan-1941, EGB:[id number]  Outpatient Primary MD for the patient is Cipriano Mile, NP    LOS - 1  Admit date - 09/15/2022    Chief Complaint  Patient presents with   Abdominal Pain       Brief Narrative (HPI from H&P)    81 y.o. female with medical history significant of HTN, HLD, recently treated colitis who presents to ed with recurrent abd pain and some nausea vomiting, she has had the symptoms for about 10 days and was previously treated with a course of azithromycin and antiemetics with minimal to no improvement, she also has had the symptoms about once or twice every couple of years.  She also has history of GJ Billroth II procedure in the past but she has been seen by [facility] and has undergone EGD and colonoscopy by them before.  This visit in the ER she was diagnosed with nonspecific colitis on CT scan in the ER and admitted to the hospital.   Subjective:    [facility] today has, No headache, No chest pain, continues to have intermittent epigastric abdominal pain with tenderness, no nausea, no shortness of breath   Assessment  & Plan :   Recurrent non specific Colitis -patient seems to have attacks of nausea vomiting, epigastric abdominal pain intermittently once or twice every few years, she has been diagnosed with nonspecific colitis in the past.  CT scan this admission also suggestive of some nonspecific colitis.  Case discussed with [facility] physician on-call, patient will be seen soon by the GI team, for now continue bowel rest, IV fluids, empiric antibiotics and monitor.  Her diarrhea seems to have resolved for the last 48  hours hence I doubt she has C. difficile.  Will defer further work-up to GI team.  Follow blood and stool cultures.    Dehydration, AKI and lactic acidosis - clinically much improved after IV fluids, continue gentle hydration .   HTN - BP stable, holding ACEI given ARF   HLD - Cont  statin per home regimen   Fibromyalgia - Seems stable at this time   Hypokalemia and hypomagnesemia.  Replaced.       Condition - Fair  Family Communication  : Daughter Donnald Garre [phone] on 09/17/2022  Code Status :  Full  Consults  :  GI  PUD Prophylaxis : PPI   Procedures  :     CTA - 1. No significant stenosis or arterial occlusion to suggest mesenteric ischemia. 2. Aortic atherosclerosis. NON-VASCULAR 1. Mild colonic wall thickening involving the transverse, descending, and sigmoid colon, possible colitis. 2. Diverticulosis without diverticulitis. 3. Small hiatal hernia. 4. Cardiomegaly.      Disposition Plan  :    Status is: Inpatient  DVT Prophylaxis  :    enoxaparin (LOVENOX) injection 30 mg Start: 09/16/22 1000  Lab Results  Component Value Date   PLT 201 09/17/2022    Diet :  Diet Order             Diet clear liquid Room service appropriate? Yes; Fluid consistency: Thin  Diet effective now                    Inpatient Medications  Scheduled Meds:  enoxaparin (LOVENOX) injection  30 mg Subcutaneous Daily   pantoprazole  40 mg Oral Daily   rosuvastatin  40 mg Oral Daily   Continuous Infusions:  cefTRIAXone (ROCEPHIN)  IV Stopped (09/16/22 2306)   lactated ringers 1,000 mL with potassium chloride 20 mEq infusion     magnesium sulfate bolus IVPB     metronidazole 500 mg (09/17/22 0007)   PRN Meds:.acetaminophen **OR** acetaminophen, albuterol, morphine injection, ondansetron (ZOFRAN) IV, oxyCODONE-acetaminophen  Antibiotics  :    Anti-infectives (From admission, onward)    Start     Dose/Rate Route Frequency Ordered Stop   09/16/22 1230  metroNIDAZOLE  (FLAGYL) IVPB 500 mg        500 mg 100 mL/hr over 60 Minutes Intravenous Every 12 hours 09/16/22 1221 09/23/22 1229   09/16/22 0245  cefTRIAXone (ROCEPHIN) 2 g in sodium chloride 0.9 % 100 mL IVPB       Note to Pharmacy: Tolerated cephalosporin in past   2 g 200 mL/hr over 30 Minutes Intravenous Daily at bedtime 09/16/22 0228          Time Spent in minutes  30   Lala Lund M.D on 09/17/2022 at 10:19 AM  To page go to [email or web address]   [facility] -  Office  [phone]  See all Orders from today for further details    Objective:   Vitals:   09/16/22 2047 09/16/22 2210 09/17/22 0342 09/17/22 0805  BP: 126/77 133/81 114/76 (!) 154/79  Pulse: (!) 59 62 (!) 59   Resp: '14 18 14 19  '$ Temp: 97.8 F (36.6 C) 98.8 F (37.1 C) 97.6 F (36.4 C) 98.2 F (36.8 C)  TempSrc: Oral Oral Oral Oral  SpO2: 99% 99% 98% 99%  Weight:      Height:        Wt Readings from Last 3 Encounters:  09/15/22 51.3 kg  09/06/22 51.3 kg  06/03/22 59.6 kg     Intake/Output Summary (Last 24 hours) at 09/17/2022 1019 Last data filed at 09/17/2022 2956 Gross per 24 hour  Intake 496.86 ml  Output 900 ml  Net -403.14 ml     Physical Exam  Awake Alert, No new F.N deficits, Normal affect [place].AT,PERRAL Supple Neck, No JVD,   Symmetrical Chest wall movement, Good air  movement bilaterally, CTAB RRR,No Gallops,Rubs or new Murmurs,  +ve B.Sounds, Abd Soft, +ve epigastric tenderness,   No Cyanosis, Clubbing or edema       Data Review:    CBC Recent Labs  Lab 09/15/22 1816 09/16/22 0502 09/17/22 0849  WBC 5.1 6.1 4.0  HGB 11.1* 9.4* 9.7*  HCT 36.8 31.3* 31.5*  PLT 259 182 201  MCV 79.1* 80.3 78.9*  MCH 23.9* 24.1* 24.3*  MCHC 30.2 30.0 30.8  RDW 14.8 14.8 15.1  LYMPHSABS 1.1  --  0.9  MONOABS 0.4  --  0.4  EOSABS 0.1  --  0.2  BASOSABS 0.0  --  0.0    Electrolytes Recent Labs  Lab 09/15/22 1816 09/15/22 2000 09/16/22 0502 09/17/22 0849  NA 137  --  135  --   K  3.4*  --  3.3*  --   CL 97*  --  99  --   CO2 21*  --  27  --   GLUCOSE 124*  --  95  --   BUN 26*  --  19  --   CREATININE 1.75*  --  1.36*  --   CALCIUM 9.5  --  8.3*  --   AST 29  --  23  --   ALT 13  --  11  --   ALKPHOS 62  --  53  --   BILITOT 1.2  --  0.6  --   ALBUMIN 4.1  --  2.9*  --   MG  --   --   --  1.5*  LATICACIDVEN 5.5* 4.6* 1.5  --   INR 1.1  --   --   --     ------------------------------------------------------------------------------------------------------------------ No results for input(s): "CHOL", "HDL", "LDLCALC", "TRIG", "CHOLHDL", "LDLDIRECT" in the last 72 hours.  Lab Results  Component Value Date   HGBA1C 6.0 (H) 02/11/2022    No results for input(s): "TSH", "T4TOTAL", "T3FREE", "THYROIDAB" in the last 72 hours.  Invalid input(s): "FREET3" ------------------------------------------------------------------------------------------------------------------ ID Labs Recent Labs  Lab 09/15/22 1816 09/15/22 2000 09/16/22 0502 09/17/22 0849  WBC 5.1  --  6.1 4.0  PLT 259  --  182 201  LATICACIDVEN 5.5* 4.6* 1.5  --   CREATININE 1.75*  --  1.36*  --     Radiology Reports CT Angio Abd/Pel W and/or Wo Contrast  Result Date: 09/15/2022 CLINICAL DATA:  Mesenteric ischemia, acute abdominal pain. EXAM: CTA ABDOMEN AND PELVIS WITHOUT AND WITH CONTRAST TECHNIQUE: Multidetector CT imaging of the abdomen and pelvis was performed using the standard protocol during bolus administration of intravenous contrast. Multiplanar reconstructed images and MIPs were obtained and reviewed to evaluate the vascular anatomy. RADIATION DOSE REDUCTION: This exam was performed according to the departmental dose-optimization program which includes automated exposure control, adjustment of the mA and/or kV according to patient size and/or use of iterative reconstruction technique. CONTRAST:  89m OMNIPAQUE IOHEXOL 350 MG/ML SOLN COMPARISON:  09/06/2022. FINDINGS: VASCULAR Aorta:  Aortic atherosclerosis. Normal caliber aorta without aneurysm, dissection, vasculitis or significant stenosis. Celiac: Patent without evidence of aneurysm, dissection, vasculitis or significant stenosis. SMA: Patent without evidence of aneurysm, dissection, vasculitis or significant stenosis. Renals: Both renal arteries are patent without evidence of aneurysm, dissection, vasculitis, fibromuscular dysplasia or significant stenosis. IMA: Patent without evidence of aneurysm, dissection, vasculitis or significant stenosis. Inflow: Patent without evidence of aneurysm, dissection, vasculitis or significant stenosis. Proximal Outflow: Bilateral common femoral and visualized portions of the superficial and profunda femoral arteries are patent  without evidence of aneurysm, dissection, vasculitis or significant stenosis. Veins: No obvious venous abnormality within the limitations of this arterial phase study. Review of the MIP images confirms the above findings. NON-VASCULAR Lower chest: The heart is enlarged. Mild atelectasis is noted at the lung bases. Hepatobiliary: Subcentimeter hypodensities are present in the liver which are too small to further characterize. Mild intrahepatic biliary ductal dilatation which may be related to post cholecystectomy status. The common bile duct is within normal limits for patient's stated age. Pancreas: Unremarkable. No pancreatic ductal dilatation or surrounding inflammatory changes. Spleen: Normal in size without focal abnormality. Adrenals/Urinary Tract: The adrenal glands are within normal limits. The kidneys enhance symmetrically. No renal calculus or hydronephrosis. Bladder is unremarkable. Stomach/Bowel: There is a small hiatal hernia. Surgical changes are noted at the stomach. Appendix appears normal. There is mild colonic wall thickening involving the transverse, descending, and sigmoid colon. No free air or pneumatosis. Scattered diverticula are present along the colon without  evidence of diverticulitis. Lymphatic: No abdominal or pelvic lymphadenopathy. Reproductive: Status post hysterectomy. No adnexal masses. Other: No abdominopelvic ascites. Musculoskeletal: Degenerative changes in the thoracolumbar spine. Mild anterior wedging at L2, unchanged. No acute osseous abnormality. IMPRESSION: VASCULAR 1. No significant stenosis or arterial occlusion to suggest mesenteric ischemia. 2. Aortic atherosclerosis. NON-VASCULAR 1. Mild colonic wall thickening involving the transverse, descending, and sigmoid colon, possible colitis. 2. Diverticulosis without diverticulitis. 3. Small hiatal hernia. 4. Cardiomegaly. Electronically Signed   By: Brett Fairy M.D.   On: 09/15/2022 23:30   DG Abd Portable 1 View  Result Date: 09/15/2022 CLINICAL DATA:  Abdominal pain, nausea and vomiting; eval for small bowel obstruction EXAM: PORTABLE ABDOMEN - 1 VIEW COMPARISON:  CT abdomen and pelvis 09/06/2022 FINDINGS: The bowel gas pattern is normal. Cholecystectomy clips. Sutures in the left upper quadrant related to Roux-en-Y gastric bypass. No radio-opaque calculi or other significant radiographic abnormality are seen. IMPRESSION: No acute abnormality. Electronically Signed   By: Placido Sou M.D.   On: 09/15/2022 21:34   DG Chest Port 1 View  Result Date: 09/15/2022 CLINICAL DATA:  Question sepsis EXAM: PORTABLE CHEST 1 VIEW COMPARISON:  Chest 01/14/2018 FINDINGS: Heart size upper normal. Vascularity normal. Lungs clear without infiltrate or effusion. No acute skeletal abnormality. IMPRESSION: No active disease. Electronically Signed   By: Franchot Gallo M.D.   On: 09/15/2022 20:00
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
## 2022-09-18 DIAGNOSIS — K529 Noninfective gastroenteritis and colitis, unspecified: Secondary | ICD-10-CM | POA: Diagnosis not present

## 2022-09-18 LAB — CBC WITH DIFFERENTIAL/PLATELET
Abs Immature Granulocytes: 0.01 10*3/uL (ref 0.00–0.07)
Basophils Absolute: 0 10*3/uL (ref 0.0–0.1)
Basophils Relative: 0 %
Eosinophils Absolute: 0.2 10*3/uL (ref 0.0–0.5)
Eosinophils Relative: 4 %
HCT: 28 % — ABNORMAL LOW (ref 36.0–46.0)
Hemoglobin: 8.7 g/dL — ABNORMAL LOW (ref 12.0–15.0)
Immature Granulocytes: 0 %
Lymphocytes Relative: 24 %
Lymphs Abs: 1.1 10*3/uL (ref 0.7–4.0)
MCH: 24.4 pg — ABNORMAL LOW (ref 26.0–34.0)
MCHC: 31.1 g/dL (ref 30.0–36.0)
MCV: 78.4 fL — ABNORMAL LOW (ref 80.0–100.0)
Monocytes Absolute: 0.5 10*3/uL (ref 0.1–1.0)
Monocytes Relative: 10 %
Neutro Abs: 2.8 10*3/uL (ref 1.7–7.7)
Neutrophils Relative %: 62 %
Platelets: 180 10*3/uL (ref 150–400)
RBC: 3.57 MIL/uL — ABNORMAL LOW (ref 3.87–5.11)
RDW: 15.1 % (ref 11.5–15.5)
WBC: 4.5 10*3/uL (ref 4.0–10.5)
nRBC: 0 % (ref 0.0–0.2)

## 2022-09-18 LAB — RETICULOCYTES
Immature Retic Fract: 5.2 % (ref 2.3–15.9)
RBC.: 3.52 MIL/uL — ABNORMAL LOW (ref 3.87–5.11)
Retic Count, Absolute: 15.5 10*3/uL — ABNORMAL LOW (ref 19.0–186.0)
Retic Ct Pct: 0.5 % (ref 0.4–3.1)

## 2022-09-18 LAB — BRAIN NATRIURETIC PEPTIDE: B Natriuretic Peptide: 72.3 pg/mL (ref 0.0–100.0)

## 2022-09-18 LAB — FOLATE: Folate: 5.4 ng/mL — ABNORMAL LOW (ref >5.9)

## 2022-09-18 LAB — C-REACTIVE PROTEIN: CRP: <0.5 mg/dL (ref <1.0)

## 2022-09-18 LAB — IRON AND TIBC
Iron: 56 ug/dL (ref 28–170)
Saturation Ratios: 23 % (ref 10.4–31.8)
TIBC: 246 ug/dL — ABNORMAL LOW (ref 250–450)
UIBC: 190 ug/dL

## 2022-09-18 LAB — VITAMIN B12: Vitamin B-12: 164 pg/mL — ABNORMAL LOW (ref 180–914)

## 2022-09-18 LAB — FERRITIN: Ferritin: 103 ng/mL (ref 11–307)

## 2022-09-18 LAB — MAGNESIUM: Magnesium: 2.1 mg/dL (ref 1.7–2.4)
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
## 2022-09-18 NOTE — Progress Notes (Signed)
PROGRESS NOTE                                                                                                                                                                                                             Patient Demographics:    Colleen Medina, is a 81 y.o. female, DOB - 1941/05/07, XBJ:[id number]  Outpatient Primary MD for the patient is Cipriano Mile, NP    LOS - 2  Admit date - 09/15/2022    Chief Complaint  Patient presents with   Abdominal Pain       Brief Narrative (HPI from H&P)    81 y.o. female with medical history significant of HTN, HLD, recently treated colitis who presents to ed with recurrent abd pain and some nausea vomiting, she has had the symptoms for about 10 days and was previously treated with a course of azithromycin and antiemetics with minimal to no improvement, she also has had the symptoms about once or twice every couple of years.  She also has history of GJ Billroth II procedure in the past but she has been seen by [facility] and has undergone EGD and colonoscopy by them before.  This visit in the ER she was diagnosed with nonspecific colitis on CT scan in the ER and admitted to the hospital.   Subjective:   Patient in bed, appears comfortable, denies any headache, no fever, no chest pain or pressure, no shortness of breath , proved epigastric abdominal pain. No focal weakness.   Assessment  & Plan :   Recurrent non specific Colitis -patient seems to have attacks of nausea vomiting, epigastric abdominal pain intermittently once or twice every few years, she has been diagnosed with nonspecific colitis in the past.  CT scan this admission also suggestive of some nonspecific colitis.  Case discussed with [facility] GI physician on-call, patient will be seen soon by the GI team, for now continue bowel rest, IV fluids, empiric antibiotics and monitor.  Her diarrhea seems to have resolved for the  last 48 hours hence I doubt she has C. difficile.  Will defer further work-up to GI team, for now they are contemplating colonoscopy on 09/19/2022.  Follow blood and stool cultures.    Dehydration, AKI and lactic acidosis - clinically much improved after IV fluids, continue gentle hydration .   HTN - BP  stable, holding ACEI given ARF   HLD - Cont statin per home regimen   Fibromyalgia - Seems stable at this time   Hypokalemia and hypomagnesemia.  Replaced.       Condition - Fair  Family Communication  : Daughter Donnald Garre [phone] on 09/17/2022, 09/18/2022  Code Status :  Full  Consults  :  GI  PUD Prophylaxis : PPI   Procedures  :     CTA - 1. No significant stenosis or arterial occlusion to suggest mesenteric ischemia. 2. Aortic atherosclerosis. NON-VASCULAR 1. Mild colonic wall thickening involving the transverse, descending, and sigmoid colon, possible colitis. 2. Diverticulosis without diverticulitis. 3. Small hiatal hernia. 4. Cardiomegaly.      Disposition Plan  :    Status is: Inpatient  DVT Prophylaxis  :    enoxaparin (LOVENOX) injection 30 mg Start: 09/16/22 1000  Lab Results  Component Value Date   PLT 180 09/18/2022    Diet :  Diet Order             Diet clear liquid Room service appropriate? Yes; Fluid consistency: Thin  Diet effective midnight                    Inpatient Medications  Scheduled Meds:  enoxaparin (LOVENOX) injection  30 mg Subcutaneous Daily   feeding supplement  1 Container Oral TID BM   pantoprazole  40 mg Oral Daily   rosuvastatin  40 mg Oral Daily   Continuous Infusions:  cefTRIAXone (ROCEPHIN)  IV 2 g (09/17/22 2116)   lactated ringers 1,000 mL with potassium chloride 20 mEq infusion 75 mL/hr at 09/18/22 0027   metronidazole 500 mg (09/18/22 0030)   PRN Meds:.acetaminophen **OR** acetaminophen, albuterol, morphine injection, ondansetron (ZOFRAN) IV, oxyCODONE-acetaminophen  Antibiotics  :    Anti-infectives  (From admission, onward)    Start     Dose/Rate Route Frequency Ordered Stop   09/16/22 1230  metroNIDAZOLE (FLAGYL) IVPB 500 mg        500 mg 100 mL/hr over 60 Minutes Intravenous Every 12 hours 09/16/22 1221 09/23/22 1229   09/16/22 0245  cefTRIAXone (ROCEPHIN) 2 g in sodium chloride 0.9 % 100 mL IVPB       Note to Pharmacy: Tolerated cephalosporin in past   2 g 200 mL/hr over 30 Minutes Intravenous Daily at bedtime 09/16/22 0228          Time Spent in minutes  30   Lala Lund M.D on 09/18/2022 at 8:41 AM  To page go to [email or web address]   [facility] -  Office  [phone]  See all Orders from today for further details    Objective:   Vitals:   09/17/22 0805 09/17/22 1220 09/17/22 1716 09/17/22 1940  BP: (!) 154/79 131/70 125/73 (!) 149/76  Pulse:  70 70 75  Resp: 19 (!) 21  20  Temp: 98.2 F (36.8 C) 97.8 F (36.6 C) 98.7 F (37.1 C) 98.2 F (36.8 C)  TempSrc: Oral Oral Oral Oral  SpO2: 99% 98% 95% 96%  Weight:      Height:        Wt Readings from Last 3 Encounters:  09/15/22 51.3 kg  09/06/22 51.3 kg  06/03/22 59.6 kg     Intake/Output Summary (Last 24 hours) at 09/18/2022 0841 Last data filed at 09/17/2022 1600 Gross per 24 hour  Intake 627.39 ml  Output --  Net 627.39 ml     Physical Exam  Awake Alert, No new F.N  deficits, Normal affect [place].AT,PERRAL Supple Neck, No JVD,   Symmetrical Chest wall movement, Good air movement bilaterally, CTAB RRR,No Gallops, Rubs or new Murmurs,  +ve B.Sounds, Abd Soft, mild epigastric tenderness No Cyanosis, Clubbing or edema        Data Review:    CBC Recent Labs  Lab 09/15/22 1816 09/16/22 0502 09/17/22 0849 09/18/22 0642  WBC 5.1 6.1 4.0 4.5  HGB 11.1* 9.4* 9.7* 8.7*  HCT 36.8 31.3* 31.5* 28.0*  PLT 259 182 201 180  MCV 79.1* 80.3 78.9* 78.4*  MCH 23.9* 24.1* 24.3* 24.4*  MCHC 30.2 30.0 30.8 31.1  RDW 14.8 14.8 15.1 15.1  LYMPHSABS 1.1  --  0.9 1.1  MONOABS 0.4  --  0.4 0.5   EOSABS 0.1  --  0.2 0.2  BASOSABS 0.0  --  0.0 0.0    Electrolytes Recent Labs  Lab 09/15/22 1816 09/15/22 2000 09/16/22 0502 09/17/22 0849 09/18/22 0642  NA 137  --  135 138  --   K 3.4*  --  3.3* 4.1  --   CL 97*  --  99 102  --   CO2 21*  --  27 26  --   GLUCOSE 124*  --  95 106*  --   BUN 26*  --  19 8  --   CREATININE 1.75*  --  1.36* 1.28*  --   CALCIUM 9.5  --  8.3* 8.9  --   AST 29  --  23  --   --   ALT 13  --  11  --   --   ALKPHOS 62  --  53  --   --   BILITOT 1.2  --  0.6  --   --   ALBUMIN 4.1  --  2.9* 3.0*  --   MG  --   --   --  1.5* 2.1  CRP  --   --   --  0.6 <0.5  LATICACIDVEN 5.5* 4.6* 1.5  --   --   INR 1.1  --   --   --   --   BNP  --   --   --   --  72.3    ------------------------------------------------------------------------------------------------------------------ No results for input(s): "CHOL", "HDL", "LDLCALC", "TRIG", "CHOLHDL", "LDLDIRECT" in the last 72 hours.  Lab Results  Component Value Date   HGBA1C 6.0 (H) 02/11/2022    No results for input(s): "TSH", "T4TOTAL", "T3FREE", "THYROIDAB" in the last 72 hours.  Invalid input(s): "FREET3" ------------------------------------------------------------------------------------------------------------------ ID Labs Recent Labs  Lab 09/15/22 1816 09/15/22 2000 09/16/22 0502 09/17/22 0849 09/18/22 0642  WBC 5.1  --  6.1 4.0 4.5  PLT 259  --  182 201 180  CRP  --   --   --  0.6 <0.5  LATICACIDVEN 5.5* 4.6* 1.5  --   --   CREATININE 1.75*  --  1.36* 1.28*  --     Radiology Reports CT Angio Abd/Pel W and/or Wo Contrast  Result Date: 09/15/2022 CLINICAL DATA:  Mesenteric ischemia, acute abdominal pain. EXAM: CTA ABDOMEN AND PELVIS WITHOUT AND WITH CONTRAST TECHNIQUE: Multidetector CT imaging of the abdomen and pelvis was performed using the standard protocol during bolus administration of intravenous contrast. Multiplanar reconstructed images and MIPs were obtained and reviewed to  evaluate the vascular anatomy. RADIATION DOSE REDUCTION: This exam was performed according to the departmental dose-optimization program which includes automated exposure control, adjustment of the mA and/or kV according to patient size and/or  use of iterative reconstruction technique. CONTRAST:  7m OMNIPAQUE IOHEXOL 350 MG/ML SOLN COMPARISON:  09/06/2022. FINDINGS: VASCULAR Aorta: Aortic atherosclerosis. Normal caliber aorta without aneurysm, dissection, vasculitis or significant stenosis. Celiac: Patent without evidence of aneurysm, dissection, vasculitis or significant stenosis. SMA: Patent without evidence of aneurysm, dissection, vasculitis or significant stenosis. Renals: Both renal arteries are patent without evidence of aneurysm, dissection, vasculitis, fibromuscular dysplasia or significant stenosis. IMA: Patent without evidence of aneurysm, dissection, vasculitis or significant stenosis. Inflow: Patent without evidence of aneurysm, dissection, vasculitis or significant stenosis. Proximal Outflow: Bilateral common femoral and visualized portions of the superficial and profunda femoral arteries are patent without evidence of aneurysm, dissection, vasculitis or significant stenosis. Veins: No obvious venous abnormality within the limitations of this arterial phase study. Review of the MIP images confirms the above findings. NON-VASCULAR Lower chest: The heart is enlarged. Mild atelectasis is noted at the lung bases. Hepatobiliary: Subcentimeter hypodensities are present in the liver which are too small to further characterize. Mild intrahepatic biliary ductal dilatation which may be related to post cholecystectomy status. The common bile duct is within normal limits for patient's stated age. Pancreas: Unremarkable. No pancreatic ductal dilatation or surrounding inflammatory changes. Spleen: Normal in size without focal abnormality. Adrenals/Urinary Tract: The adrenal glands are within normal limits. The  kidneys enhance symmetrically. No renal calculus or hydronephrosis. Bladder is unremarkable. Stomach/Bowel: There is a small hiatal hernia. Surgical changes are noted at the stomach. Appendix appears normal. There is mild colonic wall thickening involving the transverse, descending, and sigmoid colon. No free air or pneumatosis. Scattered diverticula are present along the colon without evidence of diverticulitis. Lymphatic: No abdominal or pelvic lymphadenopathy. Reproductive: Status post hysterectomy. No adnexal masses. Other: No abdominopelvic ascites. Musculoskeletal: Degenerative changes in the thoracolumbar spine. Mild anterior wedging at L2, unchanged. No acute osseous abnormality. IMPRESSION: VASCULAR 1. No significant stenosis or arterial occlusion to suggest mesenteric ischemia. 2. Aortic atherosclerosis. NON-VASCULAR 1. Mild colonic wall thickening involving the transverse, descending, and sigmoid colon, possible colitis. 2. Diverticulosis without diverticulitis. 3. Small hiatal hernia. 4. Cardiomegaly. Electronically Signed   By: LBrett FairyM.D.   On: 09/15/2022 23:30   DG Abd Portable 1 View  Result Date: 09/15/2022 CLINICAL DATA:  Abdominal pain, nausea and vomiting; eval for small bowel obstruction EXAM: PORTABLE ABDOMEN - 1 VIEW COMPARISON:  CT abdomen and pelvis 09/06/2022 FINDINGS: The bowel gas pattern is normal. Cholecystectomy clips. Sutures in the left upper quadrant related to Roux-en-Y gastric bypass. No radio-opaque calculi or other significant radiographic abnormality are seen. IMPRESSION: No acute abnormality. Electronically Signed   By: TPlacido SouM.D.   On: 09/15/2022 21:34   DG Chest Port 1 View  Result Date: 09/15/2022 CLINICAL DATA:  Question sepsis EXAM: PORTABLE CHEST 1 VIEW COMPARISON:  Chest 01/14/2018 FINDINGS: Heart size upper normal. Vascularity normal. Lungs clear without infiltrate or effusion. No acute skeletal abnormality. IMPRESSION: No active disease.  Electronically Signed   By: CFranchot GalloM.D.   On: 09/15/2022 20:00

## 2022-09-18 NOTE — Anesthesia Preprocedure Evaluation (Signed)
Anesthesia Evaluation  Patient identified by MRN, date of birth, ID band Patient awake    Reviewed: Allergy & Precautions, H&P , NPO status , Patient's Chart, lab work & pertinent test results  Airway Mallampati: II   Neck ROM: full    Dental   Pulmonary asthma ,    breath sounds clear to auscultation       Cardiovascular hypertension, + Peripheral Vascular Disease   Rhythm:regular Rate:Normal  TTE 2023 1. Left ventricular ejection fraction by 3D volume is 58 %. The left  ventricle has normal function. The left ventricle has no regional wall  motion abnormalities. Left ventricular diastolic parameters are consistent  with Grade I diastolic dysfunction  (impaired relaxation).  2. Right ventricular systolic function is normal. The right ventricular  size is normal. There is normal pulmonary artery systolic pressure.  3. The mitral valve is normal in structure. Mild mitral valve  regurgitation. No evidence of mitral stenosis.  4. Tricuspid valve regurgitation is moderate.  5. The aortic valve is normal in structure. Aortic valve regurgitation is  mild. No aortic stenosis is present.  6. Pulmonic valve regurgitation is moderate.  7. The inferior vena cava is normal in size with greater than 50%  respiratory variability, suggesting right atrial pressure of 3 mmHg   Neuro/Psych  Headaches, PSYCHIATRIC DISORDERS Depression    GI/Hepatic Neg liver ROS, hiatal hernia, GERD  ,  Endo/Other  diabetes, Type 2  Renal/GU negative Renal ROS  negative genitourinary   Musculoskeletal  (+) Arthritis , Fibromyalgia -  Abdominal   Peds  Hematology  (+) Blood dyscrasia, anemia , Lab Results      Component                Value               Date                      WBC                      4.5                 09/18/2022                HGB                      8.7 (L)             09/18/2022                HCT                       28.0 (L)            09/18/2022                MCV                      78.4 (L)            09/18/2022                PLT                      180                 09/18/2022  Anesthesia Other Findings 81 y.o. female with medical history significant of HTN, HLD, recently treated colitis who presents to ed on 9/28 with recurrent abd pain. Pt was see in ED 10 days prior with similar complaints, found to have evidence of acute colitis   Reproductive/Obstetrics                           Anesthesia Physical Anesthesia Plan  ASA: 3  Anesthesia Plan: MAC   Post-op Pain Management:    Induction: Intravenous  PONV Risk Score and Plan: Propofol infusion and Treatment may vary due to age or medical condition  Airway Management Planned: Natural Airway  Additional Equipment:   Intra-op Plan:   Post-operative Plan:   Informed Consent: I have reviewed the patients History and Physical, chart, labs and discussed the procedure including the risks, benefits and alternatives for the proposed anesthesia with the patient or authorized representative who has indicated his/her understanding and acceptance.     Dental advisory given  Plan Discussed with: CRNA, Anesthesiologist and Surgeon  Anesthesia Plan Comments: (Procedure cancelled as patient did not receive bowel prep)      Anesthesia Quick Evaluation

## 2022-09-19 DIAGNOSIS — K529 Noninfective gastroenteritis and colitis, unspecified: Secondary | ICD-10-CM | POA: Diagnosis not present

## 2022-09-19 LAB — CBC WITH DIFFERENTIAL/PLATELET
Abs Immature Granulocytes: 0.01 10*3/uL (ref 0.00–0.07)
Basophils Absolute: 0 10*3/uL (ref 0.0–0.1)
Basophils Relative: 0 %
Eosinophils Absolute: 0.2 10*3/uL (ref 0.0–0.5)
Eosinophils Relative: 3 %
HCT: 30.3 % — ABNORMAL LOW (ref 36.0–46.0)
Hemoglobin: 9.4 g/dL — ABNORMAL LOW (ref 12.0–15.0)
Immature Granulocytes: 0 %
Lymphocytes Relative: 21 %
Lymphs Abs: 1.2 10*3/uL (ref 0.7–4.0)
MCH: 24.6 pg — ABNORMAL LOW (ref 26.0–34.0)
MCHC: 31 g/dL (ref 30.0–36.0)
MCV: 79.3 fL — ABNORMAL LOW (ref 80.0–100.0)
Monocytes Absolute: 0.4 10*3/uL (ref 0.1–1.0)
Monocytes Relative: 8 %
Neutro Abs: 3.8 10*3/uL (ref 1.7–7.7)
Neutrophils Relative %: 68 %
Platelets: 198 10*3/uL (ref 150–400)
RBC: 3.82 MIL/uL — ABNORMAL LOW (ref 3.87–5.11)
RDW: 15.2 % (ref 11.5–15.5)
WBC: 5.5 10*3/uL (ref 4.0–10.5)
nRBC: 0 % (ref 0.0–0.2)

## 2022-09-19 LAB — TYPE AND SCREEN
ABO/RH(D): B POS
Antibody Screen: NEGATIVE

## 2022-09-19 LAB — BRAIN NATRIURETIC PEPTIDE: B Natriuretic Peptide: 59.7 pg/mL (ref 0.0–100.0)

## 2022-09-19 LAB — BASIC METABOLIC PANEL
Anion gap: 9 (ref 5–15)
BUN: <5 mg/dL — ABNORMAL LOW (ref 8–23)
CO2: 23 mmol/L (ref 22–32)
Calcium: 8.7 mg/dL — ABNORMAL LOW (ref 8.9–10.3)
Chloride: 104 mmol/L (ref 98–111)
Creatinine, Ser: 1.34 mg/dL — ABNORMAL HIGH (ref 0.44–1.00)
GFR, Estimated: 40 mL/min — ABNORMAL LOW (ref >60)
Glucose, Bld: 82 mg/dL (ref 70–99)
Potassium: 3.9 mmol/L (ref 3.5–5.1)
Sodium: 136 mmol/L (ref 135–145)

## 2022-09-19 LAB — MAGNESIUM: Magnesium: 1.8 mg/dL (ref 1.7–2.4)

## 2022-09-19 LAB — C-REACTIVE PROTEIN: CRP: 0.5 mg/dL (ref <1.0)

## 2022-09-19 MED ORDER — ONDANSETRON HCL 4 MG/2ML IJ SOLN
4.0000 mg | Freq: Four times a day (QID) | INTRAMUSCULAR | Status: AC
  Administered 2022-09-19 – 2022-09-20 (×2): 4 mg via INTRAVENOUS
  Filled 2022-09-19 (×3): qty 2

## 2022-09-19 MED ORDER — POLYETHYLENE GLYCOL 3350 17 GM/SCOOP PO POWD
1.0000 | Freq: Once | ORAL | Status: DC
  Filled 2022-09-19: qty 255

## 2022-09-19 MED ORDER — FLUTICASONE PROPIONATE 50 MCG/ACT NA SUSP
2.0000 | Freq: Every day | NASAL | Status: DC
  Administered 2022-09-19 – 2022-09-21 (×3): 2 via NASAL
  Filled 2022-09-19: qty 16

## 2022-09-19 MED ORDER — HYDRALAZINE HCL 20 MG/ML IJ SOLN: 10.0000 mg | Freq: Four times a day (QID) | INTRAMUSCULAR | Status: DC | PRN

## 2022-09-19 MED ORDER — MENTHOL 3 MG MT LOZG
1.0000 | LOZENGE | Freq: Four times a day (QID) | OROMUCOSAL | Status: DC
  Administered 2022-09-19 – 2022-09-21 (×8): 3 mg via ORAL
  Filled 2022-09-19 (×4): qty 9

## 2022-09-19 MED ORDER — PEG 3350-KCL-NA BICARB-NACL 420 G PO SOLR
4000.0000 mL | Freq: Once | ORAL | Status: AC
  Administered 2022-09-19: 4000 mL via ORAL
  Filled 2022-09-19: qty 4000

## 2022-09-19 MED ORDER — AMLODIPINE BESYLATE 10 MG PO TABS
10.0000 mg | ORAL_TABLET | Freq: Every day | ORAL | Status: DC
  Administered 2022-09-20 – 2022-09-21 (×2): 10 mg via ORAL
  Filled 2022-09-19 (×3): qty 1

## 2022-09-19 NOTE — Progress Notes (Signed)
Initial Nutrition Assessment  DOCUMENTATION CODES:   Severe malnutrition in context of acute illness/injury  INTERVENTION:  -Advance diet s/p colonoscopy as clinically appropriate -Continue Boost Breeze TID while pt on CL diet -When full liquid or solid foods allowed, recommend [employer] Essentials 1-2x/day -Provide diet education if special therapeutic diet needed upon discharge  NUTRITION DIAGNOSIS:  Severe Malnutrition related to acute illness as evidenced by mild fat depletion, moderate muscle depletion, energy intake < or equal to 50% for > or equal to 5 days, percent weight loss.  GOAL:  Patient will meet greater than or equal to 90% of their needs  MONITOR:   PO intake, Supplement acceptance, Diet advancement  REASON FOR ASSESSMENT:  Malnutrition Screening Tool    ASSESSMENT:  Pt is a 81yo F with PMH of HTN, DM, HLD, GERD w/ hiatal hernia, and osteoarthritis recently treated for colitis who presents to ED with recurrent abd pain. Plan for bowel prep and CL diet today for colonoscopy tomorrow.   Per chart review, pt with a significant 14% unintended weight loss in the last 3 months. Visited pt at bedside who says she is unsure what her usual weight is because she doesn't weigh herself but notes clothes have been fitting more loosely than normal. Reports poor po intake for ~1 month due to colitis. Unable to pinpoint particular foods that worsen symptoms. Been NPO/CL diet x 4 days during admission w/ limited acceptance of liquids. Has not yet tried [employer]. Pt has been meeting <50% of estimated nutrient needs for >5 days. Denies n/v. No BM x 6 days. Physical exam shows moderate muscle wasting and mild to moderate fat loss. Pt meets ASPEN criteria for severe protein calorie malnutrition r/t acute illness.   Discussed plan for colonoscopy tomorrow. RD to follow up with education if special therapeutic diet needed following results of testing. RD discussed ONS available  during admission once diet is advanced. Pt is not interested in Ensure but may be open for CBE.   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  Medications reviewed and include: lovenox, zofran, protonix, nulytely, crestor  Labs reviewed: BUN<5, Cr:1.34, GFR:40  NUTRITION - FOCUSED PHYSICAL EXAM:  Flowsheet Row Most Recent Value  Orbital Region Moderate depletion  Upper Arm Region Moderate depletion  Thoracic and Lumbar Region Mild depletion  Buccal Region Mild depletion  Temple Region Moderate depletion  Clavicle Bone Region Moderate depletion  Clavicle and Acromion Bone Region Moderate depletion  Scapular Bone Region Unable to assess  Dorsal Hand No depletion  Patellar Region Mild depletion  Anterior Thigh Region Mild depletion  Posterior Calf Region Moderate depletion  Hair Reviewed  Eyes Reviewed  Mouth Reviewed  [dark purple tongue. C/o taste changes]  Skin Reviewed  Nails Reviewed       Diet Order:   Diet Order             Diet NPO time specified  Diet effective midnight           Diet clear liquid Room service appropriate? No; Fluid consistency: Thin  Diet effective now                  EDUCATION NEEDS:   (education deferred until after colonoscopy)  Skin:  Skin Assessment: Reviewed RN Assessment  Last BM:  09/13/22 per pt report  Height:  Ht Readings from Last 1 Encounters:  09/15/22 '4\' 11"'$  (1.499 m)   Weight:  Wt Readings from Last 1 Encounters:  09/15/22 51.3 kg   BMI:  Body mass index is 22.82 kg/m.  Estimated Nutritional Needs:   Kcal:  1540-1800kcal  Protein:  75-100g  Fluid:  >1574m  KCandise Bowens MS, RD, LDN, CNSC See AMiON for contact information

## 2022-09-19 NOTE — H&P (View-Only) (Signed)
Subjective: Patient states she has not had a bowel movement since Tuesday of last week, 6 days today. She reports improvement in abdominal pain. She continues to have nausea but has not had any vomiting and is able to tolerate clear liquids.  Objective: Vital signs in last 24 hours: Temp:  [98.1 F (36.7 C)-99 F (37.2 C)] 98.7 F (37.1 C) (10/02 0905) Pulse Rate:  [65-68] 65 (10/02 0556) Resp:  [16-18] 16 (10/02 0905) BP: (112-162)/(65-86) 126/72 (10/02 0905) SpO2:  [96 %-98 %] 96 % (10/01 2127) Weight change:  Last BM Date : 09/08/22  PE: Elderly, not in distress, mild pallor GENERAL: Moist mucous membranes  ABDOMEN: Mild periumbilical abdominal tenderness, otherwise soft abdomen, normoactive bowel sounds EXTREMITIES: No deformity  Lab Results: Results for orders placed or performed during the hospital encounter of 09/15/22 (from the past 48 hour(s))  Vitamin B12     Status: Abnormal   Collection Time: 09/17/22 11:00 AM  Result Value Ref Range   Vitamin B-12 164 (L) 180 - 914 pg/mL    Comment: (NOTE) This assay is not validated for testing neonatal or myeloproliferative syndrome specimens for Vitamin B12 levels. Performed at [facility], [place] [address], [place], [place] [address]   Iron and TIBC     Status: Abnormal   Collection Time: 09/17/22 11:00 AM  Result Value Ref Range   Iron 56 28 - 170 ug/dL   TIBC 246 (L) 250 - 450 ug/dL   Saturation Ratios 23 10.4 - 31.8 %   UIBC 190 ug/dL    Comment: Performed at [facility], [place] [address], [place], [place] [address]  Ferritin     Status: None   Collection Time: 09/17/22 11:00 AM  Result Value Ref Range   Ferritin 103 11 - 307 ng/mL    Comment: Performed at [facility], [place] [address], [place], [place] [address]  Magnesium     Status: None   Collection Time: 09/18/22  6:42 AM  Result Value Ref Range   Magnesium 2.1 1.7 - 2.4 mg/dL    Comment: Performed at [facility] [address]  [place], [place] [address]  CBC with Differential/Platelet     Status: Abnormal   Collection Time: 09/18/22  6:42 AM  Result Value Ref Range   WBC 4.5 4.0 - 10.5 K/uL   RBC 3.57 (L) 3.87 - 5.11 MIL/uL   Hemoglobin 8.7 (L) 12.0 - 15.0 g/dL   HCT 28.0 (L) 36.0 - 46.0 %   MCV 78.4 (L) 80.0 - 100.0 fL   MCH 24.4 (L) 26.0 - 34.0 pg   MCHC 31.1 30.0 - 36.0 g/dL   RDW 15.1 11.5 - 15.5 %   Platelets 180 150 - 400 K/uL   nRBC 0.0 0.0 - 0.2 %   Neutrophils Relative % 62 %   Neutro Abs 2.8 1.7 - 7.7 K/uL   Lymphocytes Relative 24 %   Lymphs Abs 1.1 0.7 - 4.0 K/uL   Monocytes Relative 10 %   Monocytes Absolute 0.5 0.1 - 1.0 K/uL   Eosinophils Relative 4 %   Eosinophils Absolute 0.2 0.0 - 0.5 K/uL   Basophils Relative 0 %   Basophils Absolute 0.0 0.0 - 0.1 K/uL   Immature Granulocytes 0 %   Abs Immature Granulocytes 0.01 0.00 - 0.07 K/uL    Comment: Performed at [facility], [place] [address], [place], [place] [address]  Brain natriuretic peptide  Status: None   Collection Time: 09/18/22  6:42 AM  Result Value Ref Range   B Natriuretic Peptide 72.3 0.0 - 100.0 pg/mL    Comment: Performed at [facility] [address], [place], [place] [address]  C-reactive protein     Status: None   Collection Time: 09/18/22  6:42 AM  Result Value Ref Range   CRP <0.5 <1.0 mg/dL    Comment: Performed at [facility], [place] [address], [place], [place] [address]  Folate     Status: Abnormal   Collection Time: 09/18/22  8:41 AM  Result Value Ref Range   Folate 5.4 (L) >5.9 ng/mL    Comment: Performed at [facility], [place] [address], [place], [place] [address]  Reticulocytes     Status: Abnormal   Collection Time: 09/18/22  8:41 AM  Result Value Ref Range   Retic Ct Pct 0.5 0.4 - 3.1 %    Comment: REPEATED TO VERIFY   RBC. 3.52 (L) 3.87 - 5.11 MIL/uL   Retic Count, Absolute 15.5 (L) 19.0 - 186.0 K/uL   Immature Retic Fract 5.2 2.3 - 15.9 %    Comment: Performed at [facility] [address], [place], [place] [address]  Magnesium     Status: None   Collection Time: 09/19/22  1:45 AM  Result Value Ref Range   Magnesium 1.8 1.7 - 2.4 mg/dL    Comment: Performed at [facility] [address], [place], [place] [address]  Brain natriuretic peptide     Status: None   Collection Time: 09/19/22  1:45 AM  Result Value Ref Range   B Natriuretic Peptide 59.7 0.0 - 100.0 pg/mL    Comment: Performed at [facility] [address] [place], [place] [address]  C-reactive protein     Status: None   Collection Time: 09/19/22  1:45 AM  Result Value Ref Range   CRP 0.5 <1.0 mg/dL    Comment: Performed at [facility], [place] [address] [place], [place] [address]  Type and screen     Status: None   Collection Time: 09/19/22  1:45 AM  Result Value Ref Range   ABO/RH(D) B POS    Antibody Screen NEG    Sample Expiration      09/22/2022,2359 Performed at [facility], [place] [address], [place], [place] [address]   Basic metabolic panel     Status: Abnormal   Collection Time: 09/19/22  1:45 AM  Result Value Ref Range   Sodium 136 135 - 145 mmol/L   Potassium 3.9 3.5 - 5.1 mmol/L   Chloride 104 98 - 111 mmol/L   CO2 23 22 - 32 mmol/L   Glucose, Bld 82 70 - 99 mg/dL    Comment: Glucose reference range applies only to samples taken after fasting for at least 8 hours.   BUN <5 (L) 8 - 23 mg/dL   Creatinine, Ser 1.34 (H) 0.44 - 1.00 mg/dL   Calcium 8.7 (L) 8.9 - 10.3 mg/dL   GFR, Estimated 40 (L) >60 mL/min    Comment: (NOTE) Calculated using the CKD-EPI Creatinine Equation (2021)    Anion gap 9 5 - 15    Comment: Performed at [facility] [address] [place], [place] [address]  CBC with Differential/Platelet     Status: Abnormal   Collection Time: 09/19/22  1:45 AM  Result Value Ref Range  WBC 5.5 4.0 - 10.5 K/uL   RBC 3.82 (L) 3.87 - 5.11 MIL/uL   Hemoglobin 9.4 (L) 12.0 - 15.0 g/dL   HCT 30.3 (L) 36.0 - 46.0 %   MCV 79.3  (L) 80.0 - 100.0 fL   MCH 24.6 (L) 26.0 - 34.0 pg   MCHC 31.0 30.0 - 36.0 g/dL   RDW 15.2 11.5 - 15.5 %   Platelets 198 150 - 400 K/uL   nRBC 0.0 0.0 - 0.2 %   Neutrophils Relative % 68 %   Neutro Abs 3.8 1.7 - 7.7 K/uL   Lymphocytes Relative 21 %   Lymphs Abs 1.2 0.7 - 4.0 K/uL   Monocytes Relative 8 %   Monocytes Absolute 0.4 0.1 - 1.0 K/uL   Eosinophils Relative 3 %   Eosinophils Absolute 0.2 0.0 - 0.5 K/uL   Basophils Relative 0 %   Basophils Absolute 0.0 0.0 - 0.1 K/uL   Immature Granulocytes 0 %   Abs Immature Granulocytes 0.01 0.00 - 0.07 K/uL    Comment: Performed at [facility] [address] [place], [place] [address]    Studies/Results: No results found.  Medications: I have reviewed the patient's current medications.  Assessment: Abnormal CAT scan, 09/15/2022, with and without contrast: No significant stenosis or arterial occlusion to suggest mesenteric ischemia but noted to have mild colonic wall thickening involving the transverse, descending and sigmoid, possible colitis, diverticulosis without diverticulitis, small hiatal hernia, surgical showed just noted in the stomach Microcytic anemia without iron deficiency Mild renal impairment, BUN less than 5, creatinine 1.34 with GFR of 40   Plan: Patient originally scheduled for colonoscopy today however colonic prep was not given. Patient to be given clear liquid diet, and colonic prep today and will be scheduled for colonoscopy tomorrow at 7:30 AM. Continue supportive management with empiric antibiotics, currently on IV Rocephin and IV Flagyl. We will give patient Zofran 4 mg every 6 hours, around-the-clock for 4 doses as she continues to be nauseous and has to take the colonic prep.   Ronnette Juniper, MD 09/19/2022, 9:59 AM

## 2022-09-19 NOTE — Progress Notes (Signed)
Subjective: Patient states she has not had a bowel movement since Tuesday of last week, 6 days today. She reports improvement in abdominal pain. She continues to have nausea but has not had any vomiting and is able to tolerate clear liquids.  Objective: Vital signs in last 24 hours: Temp:  [98.1 F (36.7 C)-99 F (37.2 C)] 98.7 F (37.1 C) (10/02 0905) Pulse Rate:  [65-68] 65 (10/02 0556) Resp:  [16-18] 16 (10/02 0905) BP: (112-162)/(65-86) 126/72 (10/02 0905) SpO2:  [96 %-98 %] 96 % (10/01 2127) Weight change:  Last BM Date : 09/08/22  PE: Elderly, not in distress, mild pallor GENERAL: Moist mucous membranes  ABDOMEN: Mild periumbilical abdominal tenderness, otherwise soft abdomen, normoactive bowel sounds EXTREMITIES: No deformity  Lab Results: Results for orders placed or performed during the hospital encounter of 09/15/22 (from the past 48 hour(s))  Vitamin B12     Status: Abnormal   Collection Time: 09/17/22 11:00 AM  Result Value Ref Range   Vitamin B-12 164 (L) 180 - 914 pg/mL    Comment: (NOTE) This assay is not validated for testing neonatal or myeloproliferative syndrome specimens for Vitamin B12 levels. Performed at [facility], [place] [address], [place], [place] [address]   Iron and TIBC     Status: Abnormal   Collection Time: 09/17/22 11:00 AM  Result Value Ref Range   Iron 56 28 - 170 ug/dL   TIBC 246 (L) 250 - 450 ug/dL   Saturation Ratios 23 10.4 - 31.8 %   UIBC 190 ug/dL    Comment: Performed at [facility], [place] [address] [place], [place] [address]  Ferritin     Status: None   Collection Time: 09/17/22 11:00 AM  Result Value Ref Range   Ferritin 103 11 - 307 ng/mL    Comment: Performed at [facility], [place] [address] [place], [place] [address]  Magnesium     Status: None   Collection Time: 09/18/22  6:42 AM  Result Value Ref Range   Magnesium 2.1 1.7 - 2.4 mg/dL    Comment: Performed at [facility] [address],  [place], [place] [address]  CBC with Differential/Platelet     Status: Abnormal   Collection Time: 09/18/22  6:42 AM  Result Value Ref Range   WBC 4.5 4.0 - 10.5 K/uL   RBC 3.57 (L) 3.87 - 5.11 MIL/uL   Hemoglobin 8.7 (L) 12.0 - 15.0 g/dL   HCT 28.0 (L) 36.0 - 46.0 %   MCV 78.4 (L) 80.0 - 100.0 fL   MCH 24.4 (L) 26.0 - 34.0 pg   MCHC 31.1 30.0 - 36.0 g/dL   RDW 15.1 11.5 - 15.5 %   Platelets 180 150 - 400 K/uL   nRBC 0.0 0.0 - 0.2 %   Neutrophils Relative % 62 %   Neutro Abs 2.8 1.7 - 7.7 K/uL   Lymphocytes Relative 24 %   Lymphs Abs 1.1 0.7 - 4.0 K/uL   Monocytes Relative 10 %   Monocytes Absolute 0.5 0.1 - 1.0 K/uL   Eosinophils Relative 4 %   Eosinophils Absolute 0.2 0.0 - 0.5 K/uL   Basophils Relative 0 %   Basophils Absolute 0.0 0.0 - 0.1 K/uL   Immature Granulocytes 0 %   Abs Immature Granulocytes 0.01 0.00 - 0.07 K/uL    Comment: Performed at [facility], [place] [address], [place],  [address]  Brain natriuretic peptide  Status: None   Collection Time: 09/18/22  6:42 AM  Result Value Ref Range   B Natriuretic Peptide 72.3 0.0 - 100.0 pg/mL    Comment: Performed at [facility] [address], [place], [place] [address]  C-reactive protein     Status: None   Collection Time: 09/18/22  6:42 AM  Result Value Ref Range   CRP <0.5 <1.0 mg/dL    Comment: Performed at [facility], [place] [address], [place], [place] [address]  Folate     Status: Abnormal   Collection Time: 09/18/22  8:41 AM  Result Value Ref Range   Folate 5.4 (L) >5.9 ng/mL    Comment: Performed at [facility], [place] [address] [place], [place] [address]  Reticulocytes     Status: Abnormal   Collection Time: 09/18/22  8:41 AM  Result Value Ref Range   Retic Ct Pct 0.5 0.4 - 3.1 %    Comment: REPEATED TO VERIFY   RBC. 3.52 (L) 3.87 - 5.11 MIL/uL   Retic Count, Absolute 15.5 (L) 19.0 - 186.0 K/uL   Immature Retic Fract 5.2 2.3 - 15.9 %    Comment: Performed at [facility] [address] [place], [place] [address]  Magnesium     Status: None   Collection Time: 09/19/22  1:45 AM  Result Value Ref Range   Magnesium 1.8 1.7 - 2.4 mg/dL    Comment: Performed at [facility] [address] [place], [place] [address]  Brain natriuretic peptide     Status: None   Collection Time: 09/19/22  1:45 AM  Result Value Ref Range   B Natriuretic Peptide 59.7 0.0 - 100.0 pg/mL    Comment: Performed at [facility] [address] [place], [place] [address]  C-reactive protein     Status: None   Collection Time: 09/19/22  1:45 AM  Result Value Ref Range   CRP 0.5 <1.0 mg/dL    Comment: Performed at [facility], [place] [address] [place], [place] [address]  Type and screen     Status: None   Collection Time: 09/19/22  1:45 AM  Result Value Ref Range   ABO/RH(D) B POS    Antibody Screen NEG    Sample Expiration      09/22/2022,2359 Performed at [facility], [place] [address] [place], [place] [address]   Basic metabolic panel     Status: Abnormal   Collection Time: 09/19/22  1:45 AM  Result Value Ref Range   Sodium 136 135 - 145 mmol/L   Potassium 3.9 3.5 - 5.1 mmol/L   Chloride 104 98 - 111 mmol/L   CO2 23 22 - 32 mmol/L   Glucose, Bld 82 70 - 99 mg/dL    Comment: Glucose reference range applies only to samples taken after fasting for at least 8 hours.   BUN <5 (L) 8 - 23 mg/dL   Creatinine, Ser 1.34 (H) 0.44 - 1.00 mg/dL   Calcium 8.7 (L) 8.9 - 10.3 mg/dL   GFR, Estimated 40 (L) >60 mL/min    Comment: (NOTE) Calculated using the CKD-EPI Creatinine Equation (2021)    Anion gap 9 5 - 15    Comment: Performed at [facility] [address] [place], [place] [address]  CBC with Differential/Platelet     Status: Abnormal   Collection Time: 09/19/22  1:45 AM  Result Value Ref Range  WBC 5.5 4.0 - 10.5 K/uL   RBC 3.82 (L) 3.87 - 5.11 MIL/uL   Hemoglobin 9.4 (L) 12.0 - 15.0 g/dL   HCT 30.3 (L) 36.0 - 46.0 %   MCV 79.3  (L) 80.0 - 100.0 fL   MCH 24.6 (L) 26.0 - 34.0 pg   MCHC 31.0 30.0 - 36.0 g/dL   RDW 15.2 11.5 - 15.5 %   Platelets 198 150 - 400 K/uL   nRBC 0.0 0.0 - 0.2 %   Neutrophils Relative % 68 %   Neutro Abs 3.8 1.7 - 7.7 K/uL   Lymphocytes Relative 21 %   Lymphs Abs 1.2 0.7 - 4.0 K/uL   Monocytes Relative 8 %   Monocytes Absolute 0.4 0.1 - 1.0 K/uL   Eosinophils Relative 3 %   Eosinophils Absolute 0.2 0.0 - 0.5 K/uL   Basophils Relative 0 %   Basophils Absolute 0.0 0.0 - 0.1 K/uL   Immature Granulocytes 0 %   Abs Immature Granulocytes 0.01 0.00 - 0.07 K/uL    Comment: Performed at [facility] [address], [place], [place] [address]    Studies/Results: No results found.  Medications: I have reviewed the patient's current medications.  Assessment: Abnormal CAT scan, 09/15/2022, with and without contrast: No significant stenosis or arterial occlusion to suggest mesenteric ischemia but noted to have mild colonic wall thickening involving the transverse, descending and sigmoid, possible colitis, diverticulosis without diverticulitis, small hiatal hernia, surgical showed just noted in the stomach Microcytic anemia without iron deficiency Mild renal impairment, BUN less than 5, creatinine 1.34 with GFR of 40   Plan: Patient originally scheduled for colonoscopy today however colonic prep was not given. Patient to be given clear liquid diet, and colonic prep today and will be scheduled for colonoscopy tomorrow at 7:30 AM. Continue supportive management with empiric antibiotics, currently on IV Rocephin and IV Flagyl. We will give patient Zofran 4 mg every 6 hours, around-the-clock for 4 doses as she continues to be nauseous and has to take the colonic prep.   Ronnette Juniper, MD 09/19/2022, 9:59 AM

## 2022-09-19 NOTE — Progress Notes (Signed)
  Transition of Care [facility]) Screening Note   Patient Details  Name: Colleen Medina Date of Birth: 1941/12/07   Transition of [facility]) CM/SW Contact:    Cyndi Bender, RN Phone Number: 09/19/2022, 9:23 AM    Transition of Care Department [facility]) has reviewed patient and no TOC needs have been identified at this time. We will continue to monitor patient advancement through interdisciplinary progression rounds. If new patient transition needs arise, please place a TOC consult.

## 2022-09-19 NOTE — Care Management Important Message (Signed)
Important Message  Patient Details  Name: Colleen Medina MRN: [id number] Date of Birth: Jan 16, 1941   Medicare Important Message Given:  Yes     Orbie Pyo 09/19/2022, 4:16 PM

## 2022-09-19 NOTE — Progress Notes (Signed)
PROGRESS NOTE                                                                                                                                                                                                             Patient Demographics:    Colleen Medina, is a 81 y.o. female, DOB - [birth year], EPP:[id number]  Outpatient Primary MD for the patient is Cipriano Mile, NP    LOS - 3  Admit date - 09/15/2022    Chief Complaint  Patient presents with   Abdominal Pain       Brief Narrative (HPI from H&P)    81 y.o. female with medical history significant of HTN, HLD, recently treated colitis who presents to ed with recurrent abd pain and some nausea vomiting, she has had the symptoms for about 10 days and was previously treated with a course of azithromycin and antiemetics with minimal to no improvement, she also has had the symptoms about once or twice every couple of years.  She also has history of GJ Billroth II procedure in the past but she has been seen by [facility] and has undergone EGD and colonoscopy by them before.  This visit in the ER she was diagnosed with nonspecific colitis on CT scan in the ER and admitted to the hospital.   Subjective:   Patient in bed, appears comfortable, denies any headache, no fever, no chest pain or pressure, no shortness of breath , +ve abdominal pain. No new focal weakness.  Mild sore throat.   Assessment  & Plan :   Recurrent non specific Colitis -patient seems to have attacks of nausea vomiting, epigastric abdominal pain intermittently once or twice every few years, she has been diagnosed with nonspecific colitis in the past.  CT scan this admission also suggestive of some nonspecific colitis.  Case discussed with [facility] GI physician on-call, patient will be seen soon by the GI team, for now continue bowel rest, IV fluids, empiric antibiotics and monitor.  Her diarrhea seems to have resolved  for the last 48 hours hence I doubt she has C. difficile.  Will defer further work-up to GI team, for now they are contemplating colonoscopy on 09/19/2022.  Follow blood and stool cultures.    Dehydration, AKI and lactic acidosis - clinically much improved after IV fluids, continue gentle hydration .  HTN - BP stable, holding ACEI given ARF   HLD - Cont statin per home regimen   Fibromyalgia - Seems stable at this time   Hypokalemia and hypomagnesemia.  Replaced.  Mild sore throat.  Stable exam, no evidence of pharyngitis, lozenges and nasal spray to reduce any postnasal drip.       Condition - Fair  Family Communication  : Daughter Donnald Garre [phone] on 09/17/2022, 09/18/2022  Code Status :  Full  Consults  :  GI  PUD Prophylaxis : PPI   Procedures  :     CTA - 1. No significant stenosis or arterial occlusion to suggest mesenteric ischemia. 2. Aortic atherosclerosis. NON-VASCULAR 1. Mild colonic wall thickening involving the transverse, descending, and sigmoid colon, possible colitis. 2. Diverticulosis without diverticulitis. 3. Small hiatal hernia. 4. Cardiomegaly.      Disposition Plan  :    Status is: Inpatient  DVT Prophylaxis  :    enoxaparin (LOVENOX) injection 30 mg Start: 09/16/22 1000  Lab Results  Component Value Date   PLT 198 09/19/2022    Diet :  Diet Order             Diet clear liquid Room service appropriate? No; Fluid consistency: Thin  Diet effective now                    Inpatient Medications  Scheduled Meds:  amLODipine  10 mg Oral Daily   enoxaparin (LOVENOX) injection  30 mg Subcutaneous Daily   feeding supplement  1 Container Oral TID BM   fluticasone  2 spray Each Nare Daily   menthol-cetylpyridinium  1 lozenge Oral QID   pantoprazole  40 mg Oral Daily   polyethylene glycol-electrolytes  4,000 mL Oral Once   rosuvastatin  40 mg Oral Daily   Continuous Infusions:  cefTRIAXone (ROCEPHIN)  IV 2 g (09/18/22 2124)    metronidazole 500 mg (09/19/22 0133)   PRN Meds:.acetaminophen **OR** acetaminophen, albuterol, hydrALAZINE, morphine injection, ondansetron (ZOFRAN) IV, oxyCODONE-acetaminophen  Antibiotics  :    Anti-infectives (From admission, onward)    Start     Dose/Rate Route Frequency Ordered Stop   09/16/22 1230  metroNIDAZOLE (FLAGYL) IVPB 500 mg        500 mg 100 mL/hr over 60 Minutes Intravenous Every 12 hours 09/16/22 1221 09/23/22 1229   09/16/22 0245  cefTRIAXone (ROCEPHIN) 2 g in sodium chloride 0.9 % 100 mL IVPB       Note to Pharmacy: Tolerated cephalosporin in past   2 g 200 mL/hr over 30 Minutes Intravenous Daily at bedtime 09/16/22 0228          Time Spent in minutes  30   Lala Lund M.D on 09/19/2022 at 9:52 AM  To page go to [email or web address]   [facility] -  Office  [phone]  See all Orders from today for further details    Objective:   Vitals:   09/18/22 1643 09/18/22 2127 09/19/22 0556 09/19/22 0905  BP: (!) 147/86 (!) 162/85 112/65 126/72  Pulse: 68 65 65   Resp: 18   16  Temp: 98.1 F (36.7 C) 99 F (37.2 C)  98.7 F (37.1 C)  TempSrc: Oral Oral  Oral  SpO2: 98% 96%    Weight:      Height:        Wt Readings from Last 3 Encounters:  09/15/22 51.3 kg  09/06/22 51.3 kg  06/03/22 59.6 kg    No intake or output data  in the 24 hours ending 09/19/22 2725    Physical Exam  Awake Alert, No new F.N deficits, Normal affect [place].AT,PERRAL Supple Neck, No JVD,   Symmetrical Chest wall movement, Good air movement bilaterally, CTAB RRR,No Gallops, Rubs or new Murmurs,  +ve B.Sounds, Abd Soft, mild epigastric tenderness,   No Cyanosis, Clubbing or edema     Data Review:    CBC Recent Labs  Lab 09/15/22 1816 09/16/22 0502 09/17/22 0849 09/18/22 0642 09/19/22 0145  WBC 5.1 6.1 4.0 4.5 5.5  HGB 11.1* 9.4* 9.7* 8.7* 9.4*  HCT 36.8 31.3* 31.5* 28.0* 30.3*  PLT 259 182 201 180 198  MCV 79.1* 80.3 78.9* 78.4* 79.3*  MCH 23.9* 24.1*  24.3* 24.4* 24.6*  MCHC 30.2 30.0 30.8 31.1 31.0  RDW 14.8 14.8 15.1 15.1 15.2  LYMPHSABS 1.1  --  0.9 1.1 1.2  MONOABS 0.4  --  0.4 0.5 0.4  EOSABS 0.1  --  0.2 0.2 0.2  BASOSABS 0.0  --  0.0 0.0 0.0    Electrolytes Recent Labs  Lab 09/15/22 1816 09/15/22 2000 09/16/22 0502 09/17/22 0849 09/18/22 0642 09/19/22 0145  NA 137  --  135 138  --  136  K 3.4*  --  3.3* 4.1  --  3.9  CL 97*  --  99 102  --  104  CO2 21*  --  27 26  --  23  GLUCOSE 124*  --  95 106*  --  82  BUN 26*  --  19 8  --  <5*  CREATININE 1.75*  --  1.36* 1.28*  --  1.34*  CALCIUM 9.5  --  8.3* 8.9  --  8.7*  AST 29  --  23  --   --   --   ALT 13  --  11  --   --   --   ALKPHOS 62  --  53  --   --   --   BILITOT 1.2  --  0.6  --   --   --   ALBUMIN 4.1  --  2.9* 3.0*  --   --   MG  --   --   --  1.5* 2.1 1.8  CRP  --   --   --  0.6 <0.5 0.5  LATICACIDVEN 5.5* 4.6* 1.5  --   --   --   INR 1.1  --   --   --   --   --   BNP  --   --   --   --  72.3 59.7    ------------------------------------------------------------------------------------------------------------------ No results for input(s): "CHOL", "HDL", "LDLCALC", "TRIG", "CHOLHDL", "LDLDIRECT" in the last 72 hours.  Lab Results  Component Value Date   HGBA1C 6.0 (H) 02/11/2022    No results for input(s): "TSH", "T4TOTAL", "T3FREE", "THYROIDAB" in the last 72 hours.  Invalid input(s): "FREET3" ------------------------------------------------------------------------------------------------------------------ ID Labs Recent Labs  Lab 09/15/22 1816 09/15/22 2000 09/16/22 0502 09/17/22 0849 09/18/22 0642 09/19/22 0145  WBC 5.1  --  6.1 4.0 4.5 5.5  PLT 259  --  182 201 180 198  CRP  --   --   --  0.6 <0.5 0.5  LATICACIDVEN 5.5* 4.6* 1.5  --   --   --   CREATININE 1.75*  --  1.36* 1.28*  --  1.34*    Radiology Reports CT Angio Abd/Pel W and/or Wo Contrast  Result Date: 09/15/2022 CLINICAL DATA:  Mesenteric ischemia, acute abdominal  pain. EXAM: CTA ABDOMEN AND PELVIS WITHOUT AND WITH CONTRAST TECHNIQUE: Multidetector CT imaging of the abdomen and pelvis was performed using the standard protocol during bolus administration of intravenous contrast. Multiplanar reconstructed images and MIPs were obtained and reviewed to evaluate the vascular anatomy. RADIATION DOSE REDUCTION: This exam was performed according to the departmental dose-optimization program which includes automated exposure control, adjustment of the mA and/or kV according to patient size and/or use of iterative reconstruction technique. CONTRAST:  49m OMNIPAQUE IOHEXOL 350 MG/ML SOLN COMPARISON:  09/06/2022. FINDINGS: VASCULAR Aorta: Aortic atherosclerosis. Normal caliber aorta without aneurysm, dissection, vasculitis or significant stenosis. Celiac: Patent without evidence of aneurysm, dissection, vasculitis or significant stenosis. SMA: Patent without evidence of aneurysm, dissection, vasculitis or significant stenosis. Renals: Both renal arteries are patent without evidence of aneurysm, dissection, vasculitis, fibromuscular dysplasia or significant stenosis. IMA: Patent without evidence of aneurysm, dissection, vasculitis or significant stenosis. Inflow: Patent without evidence of aneurysm, dissection, vasculitis or significant stenosis. Proximal Outflow: Bilateral common femoral and visualized portions of the superficial and profunda femoral arteries are patent without evidence of aneurysm, dissection, vasculitis or significant stenosis. Veins: No obvious venous abnormality within the limitations of this arterial phase study. Review of the MIP images confirms the above findings. NON-VASCULAR Lower chest: The heart is enlarged. Mild atelectasis is noted at the lung bases. Hepatobiliary: Subcentimeter hypodensities are present in the liver which are too small to further characterize. Mild intrahepatic biliary ductal dilatation which may be related to post cholecystectomy status.  The common bile duct is within normal limits for patient's stated age. Pancreas: Unremarkable. No pancreatic ductal dilatation or surrounding inflammatory changes. Spleen: Normal in size without focal abnormality. Adrenals/Urinary Tract: The adrenal glands are within normal limits. The kidneys enhance symmetrically. No renal calculus or hydronephrosis. Bladder is unremarkable. Stomach/Bowel: There is a small hiatal hernia. Surgical changes are noted at the stomach. Appendix appears normal. There is mild colonic wall thickening involving the transverse, descending, and sigmoid colon. No free air or pneumatosis. Scattered diverticula are present along the colon without evidence of diverticulitis. Lymphatic: No abdominal or pelvic lymphadenopathy. Reproductive: Status post hysterectomy. No adnexal masses. Other: No abdominopelvic ascites. Musculoskeletal: Degenerative changes in the thoracolumbar spine. Mild anterior wedging at L2, unchanged. No acute osseous abnormality. IMPRESSION: VASCULAR 1. No significant stenosis or arterial occlusion to suggest mesenteric ischemia. 2. Aortic atherosclerosis. NON-VASCULAR 1. Mild colonic wall thickening involving the transverse, descending, and sigmoid colon, possible colitis. 2. Diverticulosis without diverticulitis. 3. Small hiatal hernia. 4. Cardiomegaly. Electronically Signed   By: LBrett FairyM.D.   On: 09/15/2022 23:30   DG Abd Portable 1 View  Result Date: 09/15/2022 CLINICAL DATA:  Abdominal pain, nausea and vomiting; eval for small bowel obstruction EXAM: PORTABLE ABDOMEN - 1 VIEW COMPARISON:  CT abdomen and pelvis 09/06/2022 FINDINGS: The bowel gas pattern is normal. Cholecystectomy clips. Sutures in the left upper quadrant related to Roux-en-Y gastric bypass. No radio-opaque calculi or other significant radiographic abnormality are seen. IMPRESSION: No acute abnormality. Electronically Signed   By: TPlacido SouM.D.   On: 09/15/2022 21:34   DG Chest Port 1  View  Result Date: 09/15/2022 CLINICAL DATA:  Question sepsis EXAM: PORTABLE CHEST 1 VIEW COMPARISON:  Chest 01/14/2018 FINDINGS: Heart size upper normal. Vascularity normal. Lungs clear without infiltrate or effusion. No acute skeletal abnormality. IMPRESSION: No active disease. Electronically Signed   By: CFranchot GalloM.D.   On: 09/15/2022 20:00

## 2022-09-20 ENCOUNTER — Inpatient Hospital Stay (HOSPITAL_COMMUNITY): Payer: Medicare HMO

## 2022-09-20 ENCOUNTER — Encounter (HOSPITAL_COMMUNITY): Payer: Self-pay | Admitting: Internal Medicine

## 2022-09-20 ENCOUNTER — Inpatient Hospital Stay (HOSPITAL_COMMUNITY): Payer: Medicare HMO | Admitting: Anesthesiology

## 2022-09-20 ENCOUNTER — Encounter (HOSPITAL_COMMUNITY)
Admission: EM | Disposition: A | Discharge status: Home / Self Care | Payer: Self-pay | Source: Home / Self Care | Attending: Internal Medicine

## 2022-09-20 ENCOUNTER — Other Ambulatory Visit (HOSPITAL_COMMUNITY): Payer: Self-pay

## 2022-09-20 DIAGNOSIS — I10 Essential (primary) hypertension: Secondary | ICD-10-CM

## 2022-09-20 DIAGNOSIS — K644 Residual hemorrhoidal skin tags: Secondary | ICD-10-CM

## 2022-09-20 DIAGNOSIS — E1151 Type 2 diabetes mellitus with diabetic peripheral angiopathy without gangrene: Secondary | ICD-10-CM

## 2022-09-20 DIAGNOSIS — K529 Noninfective gastroenteritis and colitis, unspecified: Secondary | ICD-10-CM | POA: Diagnosis not present

## 2022-09-20 DIAGNOSIS — K648 Other hemorrhoids: Secondary | ICD-10-CM

## 2022-09-20 DIAGNOSIS — K5730 Diverticulosis of large intestine without perforation or abscess without bleeding: Secondary | ICD-10-CM

## 2022-09-20 HISTORY — PX: COLONOSCOPY WITH PROPOFOL: SHX5780

## 2022-09-20 HISTORY — PX: BIOPSY: SHX5522

## 2022-09-20 LAB — CBC WITH DIFFERENTIAL/PLATELET
Abs Immature Granulocytes: 0.02 10*3/uL (ref 0.00–0.07)
Basophils Absolute: 0 10*3/uL (ref 0.0–0.1)
Basophils Relative: 0 %
Eosinophils Absolute: 0.1 10*3/uL (ref 0.0–0.5)
Eosinophils Relative: 2 %
HCT: 29.4 % — ABNORMAL LOW (ref 36.0–46.0)
Hemoglobin: 9.1 g/dL — ABNORMAL LOW (ref 12.0–15.0)
Immature Granulocytes: 0 %
Lymphocytes Relative: 20 %
Lymphs Abs: 1 10*3/uL (ref 0.7–4.0)
MCH: 24.3 pg — ABNORMAL LOW (ref 26.0–34.0)
MCHC: 31 g/dL (ref 30.0–36.0)
MCV: 78.4 fL — ABNORMAL LOW (ref 80.0–100.0)
Monocytes Absolute: 0.4 10*3/uL (ref 0.1–1.0)
Monocytes Relative: 8 %
Neutro Abs: 3.5 10*3/uL (ref 1.7–7.7)
Neutrophils Relative %: 70 %
Platelets: 199 10*3/uL (ref 150–400)
RBC: 3.75 MIL/uL — ABNORMAL LOW (ref 3.87–5.11)
RDW: 15.1 % (ref 11.5–15.5)
WBC: 5.1 10*3/uL (ref 4.0–10.5)
nRBC: 0 % (ref 0.0–0.2)

## 2022-09-20 LAB — BASIC METABOLIC PANEL
Anion gap: 14 (ref 5–15)
BUN: 8 mg/dL (ref 8–23)
CO2: 22 mmol/L (ref 22–32)
Calcium: 8.6 mg/dL — ABNORMAL LOW (ref 8.9–10.3)
Chloride: 100 mmol/L (ref 98–111)
Creatinine, Ser: 1.31 mg/dL — ABNORMAL HIGH (ref 0.44–1.00)
GFR, Estimated: 41 mL/min — ABNORMAL LOW (ref >60)
Glucose, Bld: 72 mg/dL (ref 70–99)
Potassium: 3.7 mmol/L (ref 3.5–5.1)
Sodium: 136 mmol/L (ref 135–145)

## 2022-09-20 LAB — CULTURE, BLOOD (ROUTINE X 2)
Culture: NO GROWTH
Culture: NO GROWTH
Special Requests: ADEQUATE
Special Requests: ADEQUATE

## 2022-09-20 LAB — BRAIN NATRIURETIC PEPTIDE: B Natriuretic Peptide: 38.4 pg/mL (ref 0.0–100.0)

## 2022-09-20 LAB — GLUCOSE, CAPILLARY
Glucose-Capillary: 132 mg/dL — ABNORMAL HIGH (ref 70–99)
Glucose-Capillary: 63 mg/dL — ABNORMAL LOW (ref 70–99)
Glucose-Capillary: 103 mg/dL — ABNORMAL HIGH (ref 70–99)
Glucose-Capillary: 122 mg/dL — ABNORMAL HIGH (ref 70–99)

## 2022-09-20 LAB — MAGNESIUM: Magnesium: 1.5 mg/dL — ABNORMAL LOW (ref 1.7–2.4)

## 2022-09-20 LAB — C-REACTIVE PROTEIN: CRP: <0.5 mg/dL (ref <1.0)

## 2022-09-20 SURGERY — CANCELLED PROCEDURE
Anesthesia: Monitor Anesthesia Care

## 2022-09-20 SURGERY — COLONOSCOPY WITH PROPOFOL
Anesthesia: Monitor Anesthesia Care

## 2022-09-20 MED ORDER — PSYLLIUM 95 % PO PACK
1.0000 | PACK | Freq: Two times a day (BID) | ORAL | Status: DC
  Administered 2022-09-21: 1 via ORAL
  Filled 2022-09-20 (×3): qty 1

## 2022-09-20 MED ORDER — FLUTICASONE PROPIONATE 50 MCG/ACT NA SUSP: 1.0000 | Freq: Every day | NASAL | Status: DC | PRN

## 2022-09-20 MED ORDER — ONDANSETRON 4 MG PO TBDP
4.0000 mg | ORAL_TABLET | Freq: Three times a day (TID) | ORAL | Status: DC | PRN
  Administered 2022-09-20: 4 mg via ORAL
  Filled 2022-09-20: qty 1

## 2022-09-20 MED ORDER — FENTANYL CITRATE (PF) 100 MCG/2ML IJ SOLN: 25.0000 ug | INTRAMUSCULAR | Status: DC | PRN

## 2022-09-20 MED ORDER — AMLODIPINE BESYLATE 10 MG PO TABS
10.0000 mg | ORAL_TABLET | Freq: Every day | ORAL | 0 refills | Status: AC
  Filled 2022-09-20: qty 30, 30d supply, fill #0

## 2022-09-20 MED ORDER — DEXTROSE 50 % IV SOLN
INTRAVENOUS | Status: DC | PRN
  Administered 2022-09-20: 25 mL via INTRAVENOUS

## 2022-09-20 MED ORDER — MAGNESIUM SULFATE 4 GM/100ML IV SOLN
4.0000 g | Freq: Once | INTRAVENOUS | Status: DC
  Filled 2022-09-20: qty 100

## 2022-09-20 MED ORDER — PROPOFOL 500 MG/50ML IV EMUL
INTRAVENOUS | Status: DC | PRN
  Administered 2022-09-20: 125 ug/kg/min via INTRAVENOUS

## 2022-09-20 MED ORDER — DULOXETINE HCL 30 MG PO CPEP
30.0000 mg | ORAL_CAPSULE | Freq: Every day | ORAL | Status: DC
  Administered 2022-09-20 – 2022-09-21 (×2): 30 mg via ORAL
  Filled 2022-09-20 (×2): qty 1

## 2022-09-20 MED ORDER — PSYLLIUM 95 % PO PACK
1.0000 | PACK | Freq: Two times a day (BID) | ORAL | 0 refills | Status: AC
  Filled 2022-09-20: qty 240, 120d supply, fill #0

## 2022-09-20 MED ORDER — SODIUM CHLORIDE 0.9 % IV SOLN: INTRAVENOUS | Status: DC

## 2022-09-20 MED ORDER — FENTANYL CITRATE PF 50 MCG/ML IJ SOSY
25.0000 ug | PREFILLED_SYRINGE | Freq: Once | INTRAMUSCULAR | Status: AC
  Administered 2022-09-20: 25 ug via INTRAVENOUS
  Filled 2022-09-20: qty 1

## 2022-09-20 MED ORDER — PHENYLEPHRINE 80 MCG/ML (10ML) SYRINGE FOR IV PUSH (FOR BLOOD PRESSURE SUPPORT)
PREFILLED_SYRINGE | INTRAVENOUS | Status: DC | PRN
  Administered 2022-09-20: 80 ug via INTRAVENOUS

## 2022-09-20 MED ORDER — PROPOFOL 10 MG/ML IV BOLUS
INTRAVENOUS | Status: DC | PRN
  Administered 2022-09-20 (×2): 20 mg via INTRAVENOUS

## 2022-09-20 MED ORDER — OXYCODONE HCL 5 MG PO TABS: 5.0000 mg | ORAL_TABLET | Freq: Once | ORAL | Status: DC | PRN

## 2022-09-20 MED ORDER — LISINOPRIL 20 MG PO TABS: 20.0000 mg | ORAL_TABLET | Freq: Every day | ORAL | Status: DC

## 2022-09-20 MED ORDER — SENNA 8.6 MG PO TABS
2.0000 | ORAL_TABLET | Freq: Every day | ORAL | Status: DC
  Administered 2022-09-20: 17.2 mg via ORAL
  Filled 2022-09-20: qty 2

## 2022-09-20 MED ORDER — MELOXICAM 7.5 MG PO TABS: 7.5000 mg | ORAL_TABLET | Freq: Every day | ORAL | Status: DC | PRN

## 2022-09-20 MED ORDER — METHOCARBAMOL 500 MG PO TABS: 500.0000 mg | ORAL_TABLET | Freq: Four times a day (QID) | ORAL | Status: DC | PRN

## 2022-09-20 MED ORDER — PANTOPRAZOLE SODIUM 40 MG PO TBEC: 40.0000 mg | DELAYED_RELEASE_TABLET | Freq: Every day | ORAL | Status: DC

## 2022-09-20 MED ORDER — ASPIRIN 81 MG PO CHEW
81.0000 mg | CHEWABLE_TABLET | Freq: Two times a day (BID) | ORAL | 0 refills | Status: AC
  Filled 2022-09-20: qty 30, 15d supply, fill #0

## 2022-09-20 MED ORDER — POLYETHYLENE GLYCOL 3350 17 G PO PACK: 17.0000 g | PACK | Freq: Every day | ORAL | Status: DC | PRN

## 2022-09-20 MED ORDER — MAGNESIUM SULFATE 4 GM/100ML IV SOLN
4.0000 g | Freq: Once | INTRAVENOUS | Status: AC
  Administered 2022-09-20: 4 g via INTRAVENOUS
  Filled 2022-09-20: qty 100

## 2022-09-20 MED ORDER — ONDANSETRON HCL 4 MG/2ML IJ SOLN: 4.0000 mg | Freq: Four times a day (QID) | INTRAMUSCULAR | Status: DC | PRN

## 2022-09-20 MED ORDER — HYDRALAZINE HCL 50 MG PO TABS
50.0000 mg | ORAL_TABLET | Freq: Three times a day (TID) | ORAL | 0 refills | Status: AC
  Filled 2022-09-20: qty 90, 30d supply, fill #0

## 2022-09-20 MED ORDER — HYDRALAZINE HCL 50 MG PO TABS
50.0000 mg | ORAL_TABLET | Freq: Three times a day (TID) | ORAL | Status: DC
  Administered 2022-09-20 – 2022-09-21 (×2): 50 mg via ORAL
  Filled 2022-09-20 (×3): qty 1

## 2022-09-20 MED ORDER — OXYCODONE HCL 5 MG/5ML PO SOLN: 5.0000 mg | Freq: Once | ORAL | Status: DC | PRN

## 2022-09-20 MED ORDER — LACTATED RINGERS IV SOLN: INTRAVENOUS | Status: DC

## 2022-09-20 MED ORDER — LACTATED RINGERS IV SOLN: INTRAVENOUS | Status: DC | PRN

## 2022-09-20 MED ORDER — DEXTROSE 50 % IV SOLN
INTRAVENOUS | Status: AC
  Filled 2022-09-20: qty 50

## 2022-09-20 SURGICAL SUPPLY — 22 items

## 2022-09-20 NOTE — Discharge Instructions (Signed)
Follow with Primary MD Cipriano Mile, NP in 7 days   Get CBC, CMP, Magnesium -  checked next visit within 1 week by Primary MD    Activity: As tolerated with Full fall precautions use walker/cane & assistance as needed  Disposition Home    Diet: Heart Healthy    Special Instructions: If you have smoked or chewed Tobacco  in the last 2 yrs please stop smoking, stop any regular Alcohol  and or any Recreational drug use.  On your next visit with your primary care physician please Get Medicines reviewed and adjusted.  Please request your Prim.MD to go over all Hospital Tests and Procedure/Radiological results at the follow up, please get all Hospital records sent to your Prim MD by signing hospital release before you go home.  If you experience worsening of your admission symptoms, develop shortness of breath, life threatening emergency, suicidal or homicidal thoughts you must seek medical attention immediately by calling 911 or calling your MD immediately  if symptoms less severe.  You Must read complete instructions/literature along with all the possible adverse reactions/side effects for all the Medicines you take and that have been prescribed to you. Take any new Medicines after you have completely understood and accpet all the possible adverse reactions/side effects.

## 2022-09-20 NOTE — Plan of Care (Signed)

## 2022-09-20 NOTE — Transfer of Care (Signed)
Immediate Anesthesia Transfer of Care Note  Patient: Colleen Medina  Procedure(s) Performed: COLONOSCOPY WITH PROPOFOL BIOPSY  Patient Location: PACU  Anesthesia Type:MAC  Level of Consciousness: drowsy and patient cooperative  Airway & Oxygen Therapy: Patient Spontanous Breathing  Post-op Assessment: Report given to RN and Post -op Vital signs reviewed and stable  Post vital signs: Reviewed and stable  Last Vitals:  Vitals Value Taken Time  BP 125/74 09/20/22 0825  Temp    Pulse 71 09/20/22 0827  Resp 18 09/20/22 0827  SpO2 100 % 09/20/22 0827  Vitals shown include unvalidated device data.  Last Pain:  Vitals:   09/20/22 0715  TempSrc: Temporal  PainSc: 0-No pain         Complications: No notable events documented.

## 2022-09-20 NOTE — Anesthesia Postprocedure Evaluation (Signed)
Anesthesia Post Note  Patient: Colleen Medina  Procedure(s) Performed: COLONOSCOPY WITH PROPOFOL BIOPSY     Patient location during evaluation: Endoscopy Anesthesia Type: MAC Level of consciousness: awake and alert Pain management: pain level controlled Vital Signs Assessment: post-procedure vital signs reviewed and stable Respiratory status: spontaneous breathing, nonlabored ventilation, respiratory function stable and patient connected to nasal cannula oxygen Cardiovascular status: stable and blood pressure returned to baseline Postop Assessment: no apparent nausea or vomiting Anesthetic complications: no   No notable events documented.  Last Vitals:  Vitals:   09/20/22 0858 09/20/22 0913  BP: (!) 161/79 (!) 144/81  Pulse: 63 65  Resp: 15 17  Temp: 36.5 C 36.7 C  SpO2: 98% 98%    Last Pain:  Vitals:   09/20/22 0913  TempSrc: Oral  PainSc:                  [name]

## 2022-09-20 NOTE — Plan of Care (Signed)
Post DC while being placed in wheelchair, abrupt, nausea and Abd pain, emesis x 1  Hold DC, clears, IVF, Zofran + Fentanyl, check KUB, GI informed.

## 2022-09-20 NOTE — Op Note (Signed)
[facility] Patient Name: Colleen Medina Procedure Date : 09/20/2022 MRN: [id number] Attending MD: Ronnette Juniper , MD Date of Birth: 04-07-1941 CSN: [id number] Age: 81 Admit Type: Inpatient Procedure:                Colonoscopy Indications:              Last colonoscopy: July 2021, Clinically significant                            diarrhea of unexplained origin, Abnormal CT of the                            GI tract Providers:                Ronnette Juniper, MD, Allayne Gitelman, RN, Fransico Setters Mbumina,                            Technician Referring MD:             [facility] Medicines:                Monitored Anesthesia Care Complications:            No immediate complications. Estimated blood loss:                            Minimal. Estimated Blood Loss:     Estimated blood loss was minimal. Procedure:                Pre-Anesthesia Assessment:                           - Prior to the procedure, a History and Physical                            was performed, and patient medications and                            allergies were reviewed. The patient's tolerance of                            previous anesthesia was also reviewed. The risks                            and benefits of the procedure and the sedation                            options and risks were discussed with the patient.                            All questions were answered, and informed consent                            was obtained. Prior Anticoagulants: The patient has                            taken no previous anticoagulant  or antiplatelet                            agents except for aspirin. ASA Grade Assessment:                            III - A patient with severe systemic disease. After                            reviewing the risks and benefits, the patient was                            deemed in satisfactory condition to undergo the                            procedure.                            After obtaining informed consent, the colonoscope                            was passed under direct vision. Throughout the                            procedure, the patient's blood pressure, pulse, and                            oxygen saturations were monitored continuously. The                            [id number]-HQ190TL ([id number]) Olympus peds colonoscope was                            introduced through the anus and advanced to the the                            terminal ileum. The colonoscopy was performed                            without difficulty. The patient tolerated the                            procedure well. The quality of the bowel                            preparation was good. Scope In: 7:56:39 AM Scope Out: 8:12:57 AM Scope Withdrawal Time: 0 hours 9 minutes 39 seconds  Total Procedure Duration: 0 hours 16 minutes 18 seconds  Findings:      Skin tags were found on perianal exam.      The terminal ileum appeared normal.      Scattered small and large-mouthed diverticula were found in the sigmoid       colon, descending colon, transverse colon, hepatic flexure, ascending       colon and cecum.      Biopsies for histology were  taken with a cold forceps for evaluation of       microscopic colitis.      Non-bleeding internal hemorrhoids were found during retroflexion.      The exam was otherwise without abnormality. Impression:               - Perianal skin tags found on perianal exam.                           - The examined portion of the ileum was normal.                           - Diverticulosis in the sigmoid colon, in the                            descending colon, in the transverse colon, at the                            hepatic flexure, in the ascending colon and in the                            cecum.                           - Non-bleeding internal hemorrhoids.                           - The examination was otherwise normal.                           -  Biopsies were taken with a cold forceps for                            evaluation of microscopic colitis. Recommendation:           - Resume regular diet.                           - Continue present medications.                           - Await pathology results.                           - Repeat colonoscopy in 10 years for screening                            purposes. Procedure Code(s):        --- Professional ---                           [phone], Colonoscopy, flexible; with biopsy, single                            or multiple Diagnosis Code(s):        --- Professional ---  K64.8, Other hemorrhoids                           K64.4, Residual hemorrhoidal skin tags                           R19.7, Diarrhea, unspecified                           K57.30, Diverticulosis of large intestine without                            perforation or abscess without bleeding                           R93.3, Abnormal findings on diagnostic imaging of                            other parts of digestive tract CPT copyright 2019 [employer]. All rights reserved. The codes documented in this report are preliminary and upon coder review may  be revised to meet current compliance requirements. Ronnette Juniper, MD 09/20/2022 8:20:42 AM This report has been signed electronically. Number of Addenda: 0

## 2022-09-20 NOTE — Progress Notes (Signed)
As pt was about to be discharged, RN called to room pt. In notable distress fanning her self, stating she was having 10/10 abdominal pain , and nausea, pt stated she was also extremely hot. Dr. Candiss Norse notified, and pt given Zofran, and medicated for pain. Per Dr. Candiss Norse discharge on hold at this time

## 2022-09-20 NOTE — Discharge Summary (Signed)
Colleen Medina WCH:[id number] DOB: March 31, 1941 DOA: 09/15/2022  PCP: Cipriano Mile, NP  Admit date: 09/15/2022  Discharge date: 09/20/2022  Admitted From: Home   Disposition:  Home   Recommendations for Outpatient Follow-up:   Follow up with PCP in 1-2 weeks  PCP Please obtain BMP/CBC, 2 view CXR in 1week,  (see Discharge instructions)   PCP Please follow up on the following pending results:    Home Health: None   Equipment/Devices: None  Consultations: GI Discharge Condition: Stable    CODE STATUS: Full    Diet Recommendation: Heart Healthy   Diet Order             Diet regular Room service appropriate? Yes; Fluid consistency: Thin  Diet effective now           Diet - low sodium heart healthy                    Chief Complaint  Patient presents with   Abdominal Pain     Brief history of present illness from the day of admission and additional interim summary    81 y.o. female with medical history significant of HTN, HLD, recently treated colitis who presents to ed with recurrent abd pain and some nausea vomiting, she has had the symptoms for about 10 days and was previously treated with a course of azithromycin and antiemetics with minimal to no improvement, she also has had the symptoms about once or twice every couple of years.  She also has history of GJ Billroth II procedure in the past but she has been seen by [facility] and has undergone EGD and colonoscopy by them before.  This visit in the ER she was diagnosed with nonspecific colitis on CT scan in the ER and admitted to the hospital.                                                                 Hospital Course   Recurrent non specific Colitis -patient seems to have attacks of nausea vomiting, epigastric abdominal pain intermittently once  or twice every few years, she has been diagnosed with nonspecific colitis in the past.  CT scan this admission also suggestive of some nonspecific colitis.  She was treated with conservative treatment IV fluids empiric IV antibiotic and bowel rest, she was seen by GI underwent colonoscopy today which showed microscopic colitis, has been placed by GI on Metamucil twice a day, per GI no antibiotics needed and patient stable for discharge, patient eager to go home will be discharged on Metamucil twice daily with outpatient follow-up with PCP and primary gastroenterologist in 1 to 2 weeks postdischarge.  Of note patient chronically takes narcotics which could be contributing to some of her GI discomfort question if she is developing some  narcotic bowel.  Defer this to PCP.   Dehydration, AKI and lactic acidosis - clinically much improved after IV fluids, will discontinue ACE inhibitor upon discharge as renal function is still not back to baseline.   HTN - on combination of Norvasc and hydralazine.  ACE inhibitor discontinued due to AKI.   HLD - Cont statin per home regimen   Fibromyalgia - Seems stable at this time   Hypokalemia and hypomagnesemia.  Replaced.   Mild sore throat.  Stable exam, no evidence of pharyngitis, lozenges and nasal spray to reduce any postnasal drip.  Resolved.   Discharge diagnosis     Principal Problem:   Colitis Active Problems:   Hyperlipidemia   Fibromyalgia syndrome   Essential hypertension   Lactic acidosis    Discharge instructions    Discharge Instructions     Diet - low sodium heart healthy   Complete by: As directed    Discharge instructions   Complete by: As directed    Follow with Primary MD Cipriano Mile, NP in 7 days   Get CBC, CMP, Magnesium -  checked next visit within 1 week by Primary MD    Activity: As tolerated with Full fall precautions use walker/cane & assistance as needed  Disposition Home    Diet: Heart Healthy    Special  Instructions: If you have smoked or chewed Tobacco  in the last 2 yrs please stop smoking, stop any regular Alcohol  and or any Recreational drug use.  On your next visit with your primary care physician please Get Medicines reviewed and adjusted.  Please request your Prim.MD to go over all Hospital Tests and Procedure/Radiological results at the follow up, please get all Hospital records sent to your Prim MD by signing hospital release before you go home.  If you experience worsening of your admission symptoms, develop shortness of breath, life threatening emergency, suicidal or homicidal thoughts you must seek medical attention immediately by calling 911 or calling your MD immediately  if symptoms less severe.  You Must read complete instructions/literature along with all the possible adverse reactions/side effects for all the Medicines you take and that have been prescribed to you. Take any new Medicines after you have completely understood and accpet all the possible adverse reactions/side effects.   Increase activity slowly   Complete by: As directed        Discharge Medications   Allergies as of 09/20/2022       Reactions   Sulfonamide Derivatives Anaphylaxis   Sulfa Antibiotics    Other reaction(s): Not available   Penicillins Hives, Other (See Comments)   All over the body Has patient had a PCN reaction causing immediate rash, facial/tongue/throat swelling, SOB or lightheadedness with hypotension: No Has patient had a PCN reaction causing severe rash involving mucus membranes or skin necrosis: No Has patient had a PCN reaction that required hospitalization: Unknown Has patient had a PCN reaction occurring within the last 10 years: No If all of the above answers are "NO", then may proceed with Cephalosporin use. Other reaction(s): Not available        Medication List     STOP taking these medications    azithromycin 250 MG tablet Commonly known as: ZITHROMAX    lisinopril 20 MG tablet Commonly known as: ZESTRIL   meloxicam 7.5 MG tablet Commonly known as: MOBIC       TAKE these medications    albuterol 108 (90 Base) MCG/ACT inhaler Commonly known as: ProAir HFA Inhale  1-2 puffs into the lungs every 6 (six) hours as needed for shortness of breath.   amLODipine 10 MG tablet Commonly known as: NORVASC Take 1 tablet (10 mg total) by mouth daily.   aspirin 81 MG chewable tablet Chew 1 tablet (81 mg total) by mouth 2 (two) times daily with a meal.   dicyclomine 20 MG tablet Commonly known as: BENTYL Take 1 tablet (20 mg total) by mouth 2 (two) times daily as needed for spasms (abdominal pain).   docusate sodium 100 MG capsule Commonly known as: COLACE Take 1 capsule (100 mg total) by mouth 2 (two) times daily.   DULoxetine 30 MG capsule Commonly known as: Cymbalta Take 1 capsule (30 mg total) by mouth daily for 7 days, THEN 2 capsules (60 mg total) daily. Start taking on: February 24, 2021 What changed: See the new instructions.   fluticasone 50 MCG/ACT nasal spray Commonly known as: FLONASE Place 1 spray into both nostrils daily. What changed:  when to take this reasons to take this   hydrALAZINE 50 MG tablet Commonly known as: APRESOLINE Take 1 tablet (50 mg total) by mouth every 8 (eight) hours.   methocarbamol 500 MG tablet Commonly known as: ROBAXIN Take 1 tablet (500 mg total) by mouth every 6 (six) hours as needed for muscle spasms.   omeprazole 40 MG capsule Commonly known as: PRILOSEC Take 40 mg by mouth daily as needed (for acid reflux).   ondansetron 4 MG disintegrating tablet Commonly known as: ZOFRAN-ODT Take 1 tablet (4 mg total) by mouth every 8 (eight) hours as needed for nausea or vomiting.   oxyCODONE-acetaminophen 5-325 MG tablet Commonly known as: PERCOCET/ROXICET Take 1 tablet by mouth every 4 (four) hours as needed for severe pain.   polyethylene glycol 17 g packet Commonly known as: MIRALAX /  GLYCOLAX Take 17 g by mouth daily as needed for mild constipation.   psyllium 95 % Pack Commonly known as: HYDROCIL/METAMUCIL Take 1 packet by mouth 2 (two) times daily.   rosuvastatin 40 MG tablet Commonly known as: CRESTOR Take 40 mg by mouth daily.   senna 8.6 MG Tabs tablet Commonly known as: SENOKOT Take 2 tablets (17.2 mg total) by mouth at bedtime.         Follow-up Information     Cipriano Mile, NP. Schedule an appointment as soon as possible for a visit in 1 week(s).   Contact information: [place] [address] [phone]         Ronnette Juniper, MD. Schedule an appointment as soon as possible for a visit in 1 week(s).   Specialty: Gastroenterology Contact information: [place] [address] [phone]                 Major procedures and Radiology Reports - PLEASE review detailed and final reports thoroughly  -     CT Angio Abd/Pel W and/or Wo Contrast  Result Date: 09/15/2022 CLINICAL DATA:  Mesenteric ischemia, acute abdominal pain. EXAM: CTA ABDOMEN AND PELVIS WITHOUT AND WITH CONTRAST TECHNIQUE: Multidetector CT imaging of the abdomen and pelvis was performed using the standard protocol during bolus administration of intravenous contrast. Multiplanar reconstructed images and MIPs were obtained and reviewed to evaluate the vascular anatomy. RADIATION DOSE REDUCTION: This exam was performed according to the departmental dose-optimization program which includes automated exposure control, adjustment of the mA and/or kV according to patient size and/or use of iterative reconstruction technique. CONTRAST:  108m OMNIPAQUE IOHEXOL 350  MG/ML SOLN COMPARISON:  09/06/2022. FINDINGS: VASCULAR Aorta: Aortic atherosclerosis. Normal caliber aorta without aneurysm, dissection, vasculitis or significant stenosis. Celiac: Patent without evidence of aneurysm, dissection, vasculitis or significant stenosis. SMA: Patent without  evidence of aneurysm, dissection, vasculitis or significant stenosis. Renals: Both renal arteries are patent without evidence of aneurysm, dissection, vasculitis, fibromuscular dysplasia or significant stenosis. IMA: Patent without evidence of aneurysm, dissection, vasculitis or significant stenosis. Inflow: Patent without evidence of aneurysm, dissection, vasculitis or significant stenosis. Proximal Outflow: Bilateral common femoral and visualized portions of the superficial and profunda femoral arteries are patent without evidence of aneurysm, dissection, vasculitis or significant stenosis. Veins: No obvious venous abnormality within the limitations of this arterial phase study. Review of the MIP images confirms the above findings. NON-VASCULAR Lower chest: The heart is enlarged. Mild atelectasis is noted at the lung bases. Hepatobiliary: Subcentimeter hypodensities are present in the liver which are too small to further characterize. Mild intrahepatic biliary ductal dilatation which may be related to post cholecystectomy status. The common bile duct is within normal limits for patient's stated age. Pancreas: Unremarkable. No pancreatic ductal dilatation or surrounding inflammatory changes. Spleen: Normal in size without focal abnormality. Adrenals/Urinary Tract: The adrenal glands are within normal limits. The kidneys enhance symmetrically. No renal calculus or hydronephrosis. Bladder is unremarkable. Stomach/Bowel: There is a small hiatal hernia. Surgical changes are noted at the stomach. Appendix appears normal. There is mild colonic wall thickening involving the transverse, descending, and sigmoid colon. No free air or pneumatosis. Scattered diverticula are present along the colon without evidence of diverticulitis. Lymphatic: No abdominal or pelvic lymphadenopathy. Reproductive: Status post hysterectomy. No adnexal masses. Other: No abdominopelvic ascites. Musculoskeletal: Degenerative changes in the  thoracolumbar spine. Mild anterior wedging at L2, unchanged. No acute osseous abnormality. IMPRESSION: VASCULAR 1. No significant stenosis or arterial occlusion to suggest mesenteric ischemia. 2. Aortic atherosclerosis. NON-VASCULAR 1. Mild colonic wall thickening involving the transverse, descending, and sigmoid colon, possible colitis. 2. Diverticulosis without diverticulitis. 3. Small hiatal hernia. 4. Cardiomegaly. Electronically Signed   By: Brett Fairy M.D.   On: 09/15/2022 23:30   DG Abd Portable 1 View  Result Date: 09/15/2022 CLINICAL DATA:  Abdominal pain, nausea and vomiting; eval for small bowel obstruction EXAM: PORTABLE ABDOMEN - 1 VIEW COMPARISON:  CT abdomen and pelvis 09/06/2022 FINDINGS: The bowel gas pattern is normal. Cholecystectomy clips. Sutures in the left upper quadrant related to Roux-en-Y gastric bypass. No radio-opaque calculi or other significant radiographic abnormality are seen. IMPRESSION: No acute abnormality. Electronically Signed   By: Placido Sou M.D.   On: 09/15/2022 21:34   DG Chest Port 1 View  Result Date: 09/15/2022 CLINICAL DATA:  Question sepsis EXAM: PORTABLE CHEST 1 VIEW COMPARISON:  Chest 01/14/2018 FINDINGS: Heart size upper normal. Vascularity normal. Lungs clear without infiltrate or effusion. No acute skeletal abnormality. IMPRESSION: No active disease. Electronically Signed   By: Franchot Gallo M.D.   On: 09/15/2022 20:00   CT ABDOMEN PELVIS W CONTRAST  Result Date: 09/06/2022 CLINICAL DATA:  Bowel obstruction suspected abdominal pain EXAM: CT ABDOMEN AND PELVIS WITH CONTRAST TECHNIQUE: Multidetector CT imaging of the abdomen and pelvis was performed using the standard protocol following bolus administration of intravenous contrast. RADIATION DOSE REDUCTION: This exam was performed according to the departmental dose-optimization program which includes automated exposure control, adjustment of the mA and/or kV according to patient size and/or use of  iterative reconstruction technique. CONTRAST:  5m OMNIPAQUE IOHEXOL 300 MG/ML  SOLN COMPARISON:  CT abdomen pelvis  06/04/2022 FINDINGS: Lower chest: Mild cardiomegaly, unchanged.  No acute abnormality. Hepatobiliary: Hepatic steatosis. The gallbladder is surgically absent. There is expected prominence of the intra extrahepatic bile ducts. Pancreas: No peripancreatic inflammatory change. Spleen: Unremarkable. Adrenals/Urinary Tract: Adrenal glands are unremarkable. No hydronephrosis or nephroureterolithiasis. The bladder is moderately distended. Stomach/Bowel: There is a gastrojejunostomy. No evidence of bowel obstruction.The appendix is normal. Colonic diverticulosis. There is mild thickening of the transverse colon, descending colon and partially the rectosigmoid colon with mild adjacent stranding. Vascular/Lymphatic: Tortuous abdominal aorta without aneurysm. There is ectasias of the right common iliac artery. Aortoiliac atherosclerosis. No lymphadenopathy. Reproductive: Prior hysterectomy. Other: No free air.  No abdominal wall hernia. Musculoskeletal: No acute osseous abnormality. No suspicious osseous lesion. Unchanged mild anterior wedging at L2. Mild osteoarthritis of the hips. IMPRESSION: Findings suggestive of colitis involving the transverse, descending, and rectosigmoid colon. No evidence of bowel obstruction.  Appendix is normal. Electronically Signed   By: Maurine Simmering M.D.   On: 09/06/2022 16:48    Micro Results   Recent Results (from the past 240 hour(s))  Urine Culture     Status: Abnormal   Collection Time: 09/15/22  5:43 PM   Specimen: Urine, Clean Catch  Result Value Ref Range Status   Specimen Description URINE, CLEAN CATCH  Final   Special Requests NONE  Final   Culture (A)  Final    <10,000 COLONIES/mL INSIGNIFICANT GROWTH Performed at [facility], [address] [place], [place] [address]    Report Status 09/16/2022 FINAL  Final  Blood culture (routine x 2)     Status:  None   Collection Time: 09/15/22  6:16 PM   Specimen: BLOOD  Result Value Ref Range Status   Specimen Description BLOOD SITE NOT SPECIFIED  Final   Special Requests   Final    BOTTLES DRAWN AEROBIC AND ANAEROBIC Blood Culture adequate volume   Culture   Final    NO GROWTH 5 DAYS Performed at [facility], [place] [address], [place], [place] [address]    Report Status 09/20/2022 FINAL  Final  Resp Panel by RT-PCR (Flu A&B, Covid) Anterior Nasal Swab     Status: None   Collection Time: 09/15/22  7:09 PM   Specimen: Anterior Nasal Swab  Result Value Ref Range Status   SARS Coronavirus 2 by RT PCR NEGATIVE NEGATIVE Final    Comment: (NOTE) SARS-CoV-2 target nucleic acids are NOT DETECTED.  The SARS-CoV-2 RNA is generally detectable in upper respiratory specimens during the acute phase of infection. The lowest concentration of SARS-CoV-2 viral copies this assay can detect is 138 copies/mL. A negative result does not preclude SARS-Cov-2 infection and should not be used as the sole basis for treatment or other patient management decisions. A negative result may occur with  improper specimen collection/handling, submission of specimen other than nasopharyngeal swab, presence of viral mutation(s) within the areas targeted by this assay, and inadequate number of viral copies(<138 copies/mL). A negative result must be combined with clinical observations, patient history, and epidemiological information. The expected result is Negative.  Fact Sheet for Patients:  EntrepreneurPulse.com.au  Fact Sheet for Healthcare Providers:  IncredibleEmployment.be  This test is no t yet approved or cleared by the [place] FDA and  has been authorized for detection and/or diagnosis of SARS-CoV-2 by FDA under an Emergency Use Authorization (EUA). This EUA will remain  in effect (meaning this test can be used) for the duration of the COVID-19 declaration under  Section 564(b)(1) of the Act, 21 U.S.C.section 360bbb-3(b)(1), unless the authorization is terminated  or revoked sooner.       Influenza A by PCR NEGATIVE NEGATIVE Final   Influenza B by PCR NEGATIVE NEGATIVE Final    Comment: (NOTE) The Xpert Xpress SARS-CoV-2/FLU/RSV plus assay is intended as an aid in the diagnosis of influenza from Nasopharyngeal swab specimens and should not be used as a sole basis for treatment. Nasal washings and aspirates are unacceptable for Xpert Xpress SARS-CoV-2/FLU/RSV testing.  Fact Sheet for Patients: EntrepreneurPulse.com.au  Fact Sheet for Healthcare Providers: IncredibleEmployment.be  This test is not yet approved or cleared by the [place] FDA and has been authorized for detection and/or diagnosis of SARS-CoV-2 by FDA under an Emergency Use Authorization (EUA). This EUA will remain in effect (meaning this test can be used) for the duration of the COVID-19 declaration under Section 564(b)(1) of the Act, 21 U.S.C. section 360bbb-3(b)(1), unless the authorization is terminated or revoked.  Performed at [facility], [place] [address] [place], [place] [address]   Blood culture (routine x 2)     Status: None   Collection Time: 09/15/22  8:00 PM   Specimen: BLOOD  Result Value Ref Range Status   Specimen Description BLOOD LEFT ANTECUBITAL  Final   Special Requests   Final    BOTTLES DRAWN AEROBIC AND ANAEROBIC Blood Culture adequate volume   Culture   Final    NO GROWTH 5 DAYS Performed at [facility], [place] [address] [place], [place] [address]    Report Status 09/20/2022 FINAL  Final    Today   Subjective    Colleen Medina today has no headache,no chest abdominal pain,no new weakness tingling or numbness, feels much better wants to go home today.    Objective   Blood pressure (!) 144/81, pulse 65, temperature 98 F (36.7 C), temperature source Oral, resp. rate 17, height '4\' 11"'$   (1.499 m), weight 51.3 kg, SpO2 98 %.   Intake/Output Summary (Last 24 hours) at 09/20/2022 0951 Last data filed at 09/20/2022 0900 Gross per 24 hour  Intake 1600 ml  Output --  Net 1600 ml    Exam  Awake Alert, No new F.N deficits,    [place].AT,PERRAL Supple Neck,   Symmetrical Chest wall movement, Good air movement bilaterally, CTAB RRR,No Gallops,   +ve B.Sounds, Abd Soft, Non tender,  No Cyanosis, Clubbing or edema    Data Review   Recent Labs  Lab 09/15/22 1816 09/16/22 0502 09/17/22 0849 09/18/22 0642 09/19/22 0145 09/20/22 0409  WBC 5.1 6.1 4.0 4.5 5.5 5.1  HGB 11.1* 9.4* 9.7* 8.7* 9.4* 9.1*  HCT 36.8 31.3* 31.5* 28.0* 30.3* 29.4*  PLT 259 182 201 180 198 199  MCV 79.1* 80.3 78.9* 78.4* 79.3* 78.4*  MCH 23.9* 24.1* 24.3* 24.4* 24.6* 24.3*  MCHC 30.2 30.0 30.8 31.1 31.0 31.0  RDW 14.8 14.8 15.1 15.1 15.2 15.1  LYMPHSABS 1.1  --  0.9 1.1 1.2 1.0  MONOABS 0.4  --  0.4 0.5 0.4 0.4  EOSABS 0.1  --  0.2 0.2 0.2 0.1  BASOSABS 0.0  --  0.0 0.0 0.0 0.0    Recent Labs  Lab 09/15/22 1816 09/15/22 2000 09/16/22 0502 09/17/22 0849 09/18/22 0642 09/19/22 0145 09/20/22 0409  NA 137  --  135 138  --  136 136  K 3.4*  --  3.3* 4.1  --  3.9 3.7  CL 97*  --  99 102  --  104 100  CO2 21*  --  27 26  --  23 22  GLUCOSE 124*  --  95 106*  --  82 72  BUN 26*  --  19 8  --  <5* 8  CREATININE 1.75*  --  1.36* 1.28*  --  1.34* 1.31*  CALCIUM 9.5  --  8.3* 8.9  --  8.7* 8.6*  AST 29  --  23  --   --   --   --   ALT 13  --  11  --   --   --   --   ALKPHOS 62  --  53  --   --   --   --   BILITOT 1.2  --  0.6  --   --   --   --   ALBUMIN 4.1  --  2.9* 3.0*  --   --   --   MG  --   --   --  1.5* 2.1 1.8 1.5*  PHOS  --   --   --  2.8  --   --   --   CRP  --   --   --  0.6 <0.5 0.5 <0.5  LATICACIDVEN 5.5* 4.6* 1.5  --   --   --   --   INR 1.1  --   --   --   --   --   --   BNP  --   --   --   --  72.3 59.7 38.4    Total Time in preparing paper work, data evaluation and  todays exam - 35 minutes  Lala Lund M.D on 09/20/2022 at 9:51 AM  [facility]

## 2022-09-20 NOTE — Interval H&P Note (Signed)
History and Physical Interval Note: 81/female with abnormal CT and diarrhea for a colonoscopy with propofol.  09/20/2022 7:33 AM  Colleen Medina  has presented today for colonoscopy, with the diagnosis of abnormal CT, diarrhea.  The various methods of treatment have been discussed with the patient and family. After consideration of risks, benefits and other options for treatment, the patient has consented to  Procedure(s): COLONOSCOPY WITH PROPOFOL (N/A) as a surgical intervention.  The patient's history has been reviewed, patient examined, no change in status, stable for surgery.  I have reviewed the patient's chart and labs.  Questions were answered to the patient's satisfaction.     Ronnette Juniper

## 2022-09-21 ENCOUNTER — Inpatient Hospital Stay (HOSPITAL_COMMUNITY): Payer: Medicare HMO

## 2022-09-21 ENCOUNTER — Other Ambulatory Visit (HOSPITAL_COMMUNITY): Payer: Self-pay

## 2022-09-21 LAB — COMPREHENSIVE METABOLIC PANEL
ALT: 19 U/L (ref 0–44)
AST: 32 U/L (ref 15–41)
Albumin: 3.2 g/dL — ABNORMAL LOW (ref 3.5–5.0)
Alkaline Phosphatase: 52 U/L (ref 38–126)
Anion gap: 13 (ref 5–15)
BUN: 7 mg/dL — ABNORMAL LOW (ref 8–23)
CO2: 24 mmol/L (ref 22–32)
Calcium: 9 mg/dL (ref 8.9–10.3)
Chloride: 97 mmol/L — ABNORMAL LOW (ref 98–111)
Creatinine, Ser: 1.07 mg/dL — ABNORMAL HIGH (ref 0.44–1.00)
GFR, Estimated: 53 mL/min — ABNORMAL LOW (ref >60)
Glucose, Bld: 100 mg/dL — ABNORMAL HIGH (ref 70–99)
Potassium: 3.2 mmol/L — ABNORMAL LOW (ref 3.5–5.1)
Sodium: 134 mmol/L — ABNORMAL LOW (ref 135–145)
Total Bilirubin: 0.6 mg/dL (ref 0.3–1.2)
Total Protein: 5.7 g/dL — ABNORMAL LOW (ref 6.5–8.1)

## 2022-09-21 LAB — MAGNESIUM: Magnesium: 2.1 mg/dL (ref 1.7–2.4)

## 2022-09-21 LAB — CBC WITH DIFFERENTIAL/PLATELET
Abs Immature Granulocytes: 0.01 10*3/uL (ref 0.00–0.07)
Basophils Absolute: 0 10*3/uL (ref 0.0–0.1)
Basophils Relative: 0 %
Eosinophils Absolute: 0.2 10*3/uL (ref 0.0–0.5)
Eosinophils Relative: 4 %
HCT: 31.4 % — ABNORMAL LOW (ref 36.0–46.0)
Hemoglobin: 9.7 g/dL — ABNORMAL LOW (ref 12.0–15.0)
Immature Granulocytes: 0 %
Lymphocytes Relative: 19 %
Lymphs Abs: 1 10*3/uL (ref 0.7–4.0)
MCH: 23.8 pg — ABNORMAL LOW (ref 26.0–34.0)
MCHC: 30.9 g/dL (ref 30.0–36.0)
MCV: 77 fL — ABNORMAL LOW (ref 80.0–100.0)
Monocytes Absolute: 0.5 10*3/uL (ref 0.1–1.0)
Monocytes Relative: 10 %
Neutro Abs: 3.4 10*3/uL (ref 1.7–7.7)
Neutrophils Relative %: 67 %
Platelets: 208 10*3/uL (ref 150–400)
RBC: 4.08 MIL/uL (ref 3.87–5.11)
RDW: 14.6 % (ref 11.5–15.5)
WBC: 5.1 10*3/uL (ref 4.0–10.5)
nRBC: 0 % (ref 0.0–0.2)

## 2022-09-21 LAB — C-REACTIVE PROTEIN: CRP: 0.5 mg/dL (ref <1.0)

## 2022-09-21 MED ORDER — SIMETHICONE 40 MG/0.6ML PO SUSP
40.0000 mg | Freq: Once | ORAL | Status: DC
  Filled 2022-09-21: qty 0.6

## 2022-09-21 MED ORDER — SIMETHICONE 80 MG PO CHEW: 80.0000 mg | CHEWABLE_TABLET | Freq: Once | ORAL | Status: DC

## 2022-09-21 MED ORDER — SIMETHICONE 80 MG PO CHEW
80.0000 mg | CHEWABLE_TABLET | Freq: Four times a day (QID) | ORAL | 2 refills | Status: AC | PRN
  Filled 2022-09-21: qty 100, 25d supply, fill #0

## 2022-09-21 MED ORDER — SIMETHICONE 80 MG PO CHEW
80.0000 mg | CHEWABLE_TABLET | Freq: Four times a day (QID) | ORAL | 2 refills | Status: DC | PRN
  Filled 2022-09-21: qty 36, 9d supply, fill #0

## 2022-09-21 NOTE — Plan of Care (Signed)

## 2022-09-21 NOTE — Discharge Summary (Signed)
Colleen Medina LGX:[id number] DOB: [birth year] DOA: 09/15/2022  PCP: Cipriano Mile, NP  Admit date: 09/15/2022  Discharge date: 09/21/2022  Admitted From: Home   Disposition:  Home  No significant update on discharge summary dictated by Dr. Candiss Norse 09/20/2022, but patient had an episode of vomiting, abdominal pain, mental x-ray done yesterday, and this morning with no acute findings, patient reports she is feeling much better today, asking to go home, patient already seen by GI this morning, she is stable for discharge, will add as needed simethicone to her medicine regimen.  Recommendations for Outpatient Follow-up:   Follow up with PCP in 1-2 weeks  PCP Please obtain BMP/CBC, 2 view CXR in 1week,  (see Discharge instructions)   PCP Please follow up on the following pending results:    Home Health: None   Equipment/Devices: None  Consultations: GI Discharge Condition: Stable    CODE STATUS: Full    Diet Recommendation: Heart Healthy   Diet Order             Diet - low sodium heart healthy           Diet clear liquid Room service appropriate? Yes; Fluid consistency: Thin  Diet effective now           Diet - low sodium heart healthy                    Chief Complaint  Patient presents with   Abdominal Pain     Brief history of present illness from the day of admission and additional interim summary    81 y.o. female with medical history significant of HTN, HLD, recently treated colitis who presents to ed with recurrent abd pain and some nausea vomiting, she has had the symptoms for about 10 days and was previously treated with a course of azithromycin and antiemetics with minimal to no improvement, she also has had the symptoms about once or twice every couple of years.  She also has history of GJ  Billroth II procedure in the past but she has been seen by [facility] and has undergone EGD and colonoscopy by them before.  This visit in the ER she was diagnosed with nonspecific colitis on CT scan in the ER and admitted to the hospital.                                                                 Hospital Course   Recurrent non specific Colitis -patient seems to have attacks of nausea vomiting, epigastric abdominal pain intermittently once or twice every few years, she has been diagnosed with nonspecific colitis in the past.  CT scan this admission also suggestive of some nonspecific colitis.  She was treated with conservative treatment IV fluids empiric IV antibiotic and bowel rest,  she was seen by GI underwent colonoscopy today which showed microscopic colitis, has been placed by GI on Metamucil twice a day, per GI no antibiotics needed and patient stable for discharge, patient eager to go home will be discharged on Metamucil twice daily with outpatient follow-up with PCP and primary gastroenterologist in 1 to 2 weeks postdischarge.  Of note patient chronically takes narcotics which could be contributing to some of her GI discomfort question if she is developing some narcotic bowel.  Defer this to PCP.   Dehydration, AKI and lactic acidosis - clinically much improved after IV fluids, will discontinue ACE inhibitor upon discharge as renal function is still not back to baseline.   HTN - on combination of Norvasc and hydralazine.  ACE inhibitor discontinued due to AKI.   HLD - Cont statin per home regimen   Fibromyalgia - Seems stable at this time   Hypokalemia and hypomagnesemia.  Replaced.   Mild sore throat.  Stable exam, no evidence of pharyngitis, lozenges and nasal spray to reduce any postnasal drip.  Resolved.   Discharge diagnosis     Principal Problem:   Colitis Active Problems:   Hyperlipidemia   Fibromyalgia syndrome   Essential hypertension   Lactic  acidosis    Discharge instructions    Discharge Instructions     Diet - low sodium heart healthy   Complete by: As directed    Diet - low sodium heart healthy   Complete by: As directed    Discharge instructions   Complete by: As directed    Follow with Primary MD Cipriano Mile, NP in 7 days   Get CBC, CMP, Magnesium -  checked next visit within 1 week by Primary MD    Activity: As tolerated with Full fall precautions use walker/cane & assistance as needed  Disposition Home    Diet: Heart Healthy    Special Instructions: If you have smoked or chewed Tobacco  in the last 2 yrs please stop smoking, stop any regular Alcohol  and or any Recreational drug use.  On your next visit with your primary care physician please Get Medicines reviewed and adjusted.  Please request your Prim.MD to go over all Hospital Tests and Procedure/Radiological results at the follow up, please get all Hospital records sent to your Prim MD by signing hospital release before you go home.  If you experience worsening of your admission symptoms, develop shortness of breath, life threatening emergency, suicidal or homicidal thoughts you must seek medical attention immediately by calling 911 or calling your MD immediately  if symptoms less severe.  You Must read complete instructions/literature along with all the possible adverse reactions/side effects for all the Medicines you take and that have been prescribed to you. Take any new Medicines after you have completely understood and accpet all the possible adverse reactions/side effects.   Increase activity slowly   Complete by: As directed        Discharge Medications   Allergies as of 09/21/2022       Reactions   Sulfonamide Derivatives Anaphylaxis   Sulfa Antibiotics    Other reaction(s): Not available   Penicillins Hives, Other (See Comments)   All over the body Has patient had a PCN reaction causing immediate rash, facial/tongue/throat swelling,  SOB or lightheadedness with hypotension: No Has patient had a PCN reaction causing severe rash involving mucus membranes or skin necrosis: No Has patient had a PCN reaction that required hospitalization: Unknown Has patient had a PCN reaction occurring  within the last 10 years: No If all of the above answers are "NO", then may proceed with Cephalosporin use. Other reaction(s): Not available        Medication List     STOP taking these medications    azithromycin 250 MG tablet Commonly known as: ZITHROMAX   lisinopril 20 MG tablet Commonly known as: ZESTRIL   meloxicam 7.5 MG tablet Commonly known as: MOBIC       TAKE these medications    albuterol 108 (90 Base) MCG/ACT inhaler Commonly known as: ProAir HFA Inhale 1-2 puffs into the lungs every 6 (six) hours as needed for shortness of breath.   amLODipine 10 MG tablet Commonly known as: NORVASC Take 1 tablet (10 mg total) by mouth daily.   Aspirin Low Dose 81 MG chewable tablet Generic drug: aspirin Chew 1 tablet (81 mg total) by mouth 2 (two) times daily with a meal.   dicyclomine 20 MG tablet Commonly known as: BENTYL Take 1 tablet (20 mg total) by mouth 2 (two) times daily as needed for spasms (abdominal pain).   docusate sodium 100 MG capsule Commonly known as: COLACE Take 1 capsule (100 mg total) by mouth 2 (two) times daily.   DULoxetine 30 MG capsule Commonly known as: Cymbalta Take 1 capsule (30 mg total) by mouth daily for 7 days, THEN 2 capsules (60 mg total) daily. Start taking on: February 24, 2021 What changed: See the new instructions.   fluticasone 50 MCG/ACT nasal spray Commonly known as: FLONASE Place 1 spray into both nostrils daily. What changed:  when to take this reasons to take this   hydrALAZINE 50 MG tablet Commonly known as: APRESOLINE Take 1 tablet (50 mg total) by mouth every 8 (eight) hours.   methocarbamol 500 MG tablet Commonly known as: ROBAXIN Take 1 tablet (500 mg total)  by mouth every 6 (six) hours as needed for muscle spasms.   omeprazole 40 MG capsule Commonly known as: PRILOSEC Take 40 mg by mouth daily as needed (for acid reflux).   ondansetron 4 MG disintegrating tablet Commonly known as: ZOFRAN-ODT Take 1 tablet (4 mg total) by mouth every 8 (eight) hours as needed for nausea or vomiting.   oxyCODONE-acetaminophen 5-325 MG tablet Commonly known as: PERCOCET/ROXICET Take 1 tablet by mouth every 4 (four) hours as needed for severe pain.   polyethylene glycol 17 g packet Commonly known as: MIRALAX / GLYCOLAX Take 17 g by mouth daily as needed for mild constipation.   psyllium 95 % Pack Commonly known as: HYDROCIL/METAMUCIL Take 1 packet by mouth 2 (two) times daily.   rosuvastatin 40 MG tablet Commonly known as: CRESTOR Take 40 mg by mouth daily.   senna 8.6 MG Tabs tablet Commonly known as: SENOKOT Take 2 tablets (17.2 mg total) by mouth at bedtime.   simethicone 80 MG chewable tablet Commonly known as: Gas-X Chew 1 tablet (80 mg total) by mouth 4 (four) times daily as needed for flatulence.          Follow-up Information     Cipriano Mile, NP. Schedule an appointment as soon as possible for a visit in 1 week(s).   Contact information: [place] [address] [phone]         Ronnette Juniper, MD. Schedule an appointment as soon as possible for a visit in 1 week(s).   Specialty: Gastroenterology Contact information: [address] [place] [address] [phone]  Major procedures and Radiology Reports - PLEASE review detailed and final reports thoroughly  -     DG Abd Portable 1V  Result Date: 09/21/2022 CLINICAL DATA:  Persistent abdominal pain since colonoscopy yesterday. EXAM: PORTABLE ABDOMEN - 1 VIEW COMPARISON:  Abdominal radiograph 07/21/2022; CT abdomen pelvis 09/15/2022 FINDINGS: The bowel gas pattern is normal. No findings of free intraperitoneal air given  the limits of this single supine image. Postsurgical changes noted in the mid left abdomen. No radio-opaque calculi or other significant radiographic abnormality are seen. Surgical clips overlie the right upper abdomen. IMPRESSION: Normal bowel-gas pattern. No findings of free intraperitoneal air on this single supine image. Electronically Signed   By: Ileana Roup M.D.   On: 09/21/2022 09:28   DG Abd Portable 1V  Result Date: 09/20/2022 CLINICAL DATA:  Abdominal pain. EXAM: PORTABLE ABDOMEN - 1 VIEW COMPARISON:  September 15, 2022 FINDINGS: The bowel gas pattern is normal. A mild amount of stool is seen throughout the large bowel. Radiopaque surgical clips are seen overlying the right upper quadrant. Numerous phleboliths are seen within the lower pelvis. No radio-opaque calculi or other significant radiographic abnormality are seen. IMPRESSION: Negative. Electronically Signed   By: Virgina Norfolk M.D.   On: 09/20/2022 17:17   CT Angio Abd/Pel W and/or Wo Contrast  Result Date: 09/15/2022 CLINICAL DATA:  Mesenteric ischemia, acute abdominal pain. EXAM: CTA ABDOMEN AND PELVIS WITHOUT AND WITH CONTRAST TECHNIQUE: Multidetector CT imaging of the abdomen and pelvis was performed using the standard protocol during bolus administration of intravenous contrast. Multiplanar reconstructed images and MIPs were obtained and reviewed to evaluate the vascular anatomy. RADIATION DOSE REDUCTION: This exam was performed according to the departmental dose-optimization program which includes automated exposure control, adjustment of the mA and/or kV according to patient size and/or use of iterative reconstruction technique. CONTRAST:  78m OMNIPAQUE IOHEXOL 350 MG/ML SOLN COMPARISON:  09/06/2022. FINDINGS: VASCULAR Aorta: Aortic atherosclerosis. Normal caliber aorta without aneurysm, dissection, vasculitis or significant stenosis. Celiac: Patent without evidence of aneurysm, dissection, vasculitis or significant stenosis.  SMA: Patent without evidence of aneurysm, dissection, vasculitis or significant stenosis. Renals: Both renal arteries are patent without evidence of aneurysm, dissection, vasculitis, fibromuscular dysplasia or significant stenosis. IMA: Patent without evidence of aneurysm, dissection, vasculitis or significant stenosis. Inflow: Patent without evidence of aneurysm, dissection, vasculitis or significant stenosis. Proximal Outflow: Bilateral common femoral and visualized portions of the superficial and profunda femoral arteries are patent without evidence of aneurysm, dissection, vasculitis or significant stenosis. Veins: No obvious venous abnormality within the limitations of this arterial phase study. Review of the MIP images confirms the above findings. NON-VASCULAR Lower chest: The heart is enlarged. Mild atelectasis is noted at the lung bases. Hepatobiliary: Subcentimeter hypodensities are present in the liver which are too small to further characterize. Mild intrahepatic biliary ductal dilatation which may be related to post cholecystectomy status. The common bile duct is within normal limits for patient's stated age. Pancreas: Unremarkable. No pancreatic ductal dilatation or surrounding inflammatory changes. Spleen: Normal in size without focal abnormality. Adrenals/Urinary Tract: The adrenal glands are within normal limits. The kidneys enhance symmetrically. No renal calculus or hydronephrosis. Bladder is unremarkable. Stomach/Bowel: There is a small hiatal hernia. Surgical changes are noted at the stomach. Appendix appears normal. There is mild colonic wall thickening involving the transverse, descending, and sigmoid colon. No free air or pneumatosis. Scattered diverticula are present along the colon without evidence of diverticulitis. Lymphatic: No abdominal or pelvic lymphadenopathy. Reproductive: Status post hysterectomy. No  adnexal masses. Other: No abdominopelvic ascites. Musculoskeletal: Degenerative  changes in the thoracolumbar spine. Mild anterior wedging at L2, unchanged. No acute osseous abnormality. IMPRESSION: VASCULAR 1. No significant stenosis or arterial occlusion to suggest mesenteric ischemia. 2. Aortic atherosclerosis. NON-VASCULAR 1. Mild colonic wall thickening involving the transverse, descending, and sigmoid colon, possible colitis. 2. Diverticulosis without diverticulitis. 3. Small hiatal hernia. 4. Cardiomegaly. Electronically Signed   By: Brett Fairy M.D.   On: 09/15/2022 23:30   DG Abd Portable 1 View  Result Date: 09/15/2022 CLINICAL DATA:  Abdominal pain, nausea and vomiting; eval for small bowel obstruction EXAM: PORTABLE ABDOMEN - 1 VIEW COMPARISON:  CT abdomen and pelvis 09/06/2022 FINDINGS: The bowel gas pattern is normal. Cholecystectomy clips. Sutures in the left upper quadrant related to Roux-en-Y gastric bypass. No radio-opaque calculi or other significant radiographic abnormality are seen. IMPRESSION: No acute abnormality. Electronically Signed   By: Placido Sou M.D.   On: 09/15/2022 21:34   DG Chest Port 1 View  Result Date: 09/15/2022 CLINICAL DATA:  Question sepsis EXAM: PORTABLE CHEST 1 VIEW COMPARISON:  Chest 01/14/2018 FINDINGS: Heart size upper normal. Vascularity normal. Lungs clear without infiltrate or effusion. No acute skeletal abnormality. IMPRESSION: No active disease. Electronically Signed   By: Franchot Gallo M.D.   On: 09/15/2022 20:00   CT ABDOMEN PELVIS W CONTRAST  Result Date: 09/06/2022 CLINICAL DATA:  Bowel obstruction suspected abdominal pain EXAM: CT ABDOMEN AND PELVIS WITH CONTRAST TECHNIQUE: Multidetector CT imaging of the abdomen and pelvis was performed using the standard protocol following bolus administration of intravenous contrast. RADIATION DOSE REDUCTION: This exam was performed according to the departmental dose-optimization program which includes automated exposure control, adjustment of the mA and/or kV according to patient  size and/or use of iterative reconstruction technique. CONTRAST:  67m OMNIPAQUE IOHEXOL 300 MG/ML  SOLN COMPARISON:  CT abdomen pelvis 06/04/2022 FINDINGS: Lower chest: Mild cardiomegaly, unchanged.  No acute abnormality. Hepatobiliary: Hepatic steatosis. The gallbladder is surgically absent. There is expected prominence of the intra extrahepatic bile ducts. Pancreas: No peripancreatic inflammatory change. Spleen: Unremarkable. Adrenals/Urinary Tract: Adrenal glands are unremarkable. No hydronephrosis or nephroureterolithiasis. The bladder is moderately distended. Stomach/Bowel: There is a gastrojejunostomy. No evidence of bowel obstruction.The appendix is normal. Colonic diverticulosis. There is mild thickening of the transverse colon, descending colon and partially the rectosigmoid colon with mild adjacent stranding. Vascular/Lymphatic: Tortuous abdominal aorta without aneurysm. There is ectasias of the right common iliac artery. Aortoiliac atherosclerosis. No lymphadenopathy. Reproductive: Prior hysterectomy. Other: No free air.  No abdominal wall hernia. Musculoskeletal: No acute osseous abnormality. No suspicious osseous lesion. Unchanged mild anterior wedging at L2. Mild osteoarthritis of the hips. IMPRESSION: Findings suggestive of colitis involving the transverse, descending, and rectosigmoid colon. No evidence of bowel obstruction.  Appendix is normal. Electronically Signed   By: JMaurine SimmeringM.D.   On: 09/06/2022 16:48    Micro Results   Recent Results (from the past 240 hour(s))  Urine Culture     Status: Abnormal   Collection Time: 09/15/22  5:43 PM   Specimen: Urine, Clean Catch  Result Value Ref Range Status   Specimen Description URINE, CLEAN CATCH  Final   Special Requests NONE  Final   Culture (A)  Final    <10,000 COLONIES/mL INSIGNIFICANT GROWTH Performed at [facility] [address] [place] [address]   Report Status 09/16/2022 FINAL  Final  Blood culture  (routine x 2)     Status: None  Collection Time: 09/15/22  6:16 PM   Specimen: BLOOD  Result Value Ref Range Status   Specimen Description BLOOD SITE NOT SPECIFIED  Final   Special Requests   Final    BOTTLES DRAWN AEROBIC AND ANAEROBIC Blood Culture adequate volume   Culture   Final    NO GROWTH 5 DAYS Performed at [facility], [address] [place], [place] [address]    Report Status 09/20/2022 FINAL  Final  Resp Panel by RT-PCR (Flu A&B, Covid) Anterior Nasal Swab     Status: None   Collection Time: 09/15/22  7:09 PM   Specimen: Anterior Nasal Swab  Result Value Ref Range Status   SARS Coronavirus 2 by RT PCR NEGATIVE NEGATIVE Final    Comment: (NOTE) SARS-CoV-2 target nucleic acids are NOT DETECTED.  The SARS-CoV-2 RNA is generally detectable in upper respiratory specimens during the acute phase of infection. The lowest concentration of SARS-CoV-2 viral copies this assay can detect is 138 copies/mL. A negative result does not preclude SARS-Cov-2 infection and should not be used as the sole basis for treatment or other patient management decisions. A negative result may occur with  improper specimen collection/handling, submission of specimen other than nasopharyngeal swab, presence of viral mutation(s) within the areas targeted by this assay, and inadequate number of viral copies(<138 copies/mL). A negative result must be combined with clinical observations, patient history, and epidemiological information. The expected result is Negative.  Fact Sheet for Patients:  EntrepreneurPulse.com.au  Fact Sheet for Healthcare Providers:  IncredibleEmployment.be  This test is no t yet approved or cleared by the [place] FDA and  has been authorized for detection and/or diagnosis of SARS-CoV-2 by FDA under an Emergency Use Authorization (EUA). This EUA will remain  in effect (meaning this test can be used) for the duration of  the COVID-19 declaration under Section 564(b)(1) of the Act, 21 U.S.C.section 360bbb-3(b)(1), unless the authorization is terminated  or revoked sooner.       Influenza A by PCR NEGATIVE NEGATIVE Final   Influenza B by PCR NEGATIVE NEGATIVE Final    Comment: (NOTE) The Xpert Xpress SARS-CoV-2/FLU/RSV plus assay is intended as an aid in the diagnosis of influenza from Nasopharyngeal swab specimens and should not be used as a sole basis for treatment. Nasal washings and aspirates are unacceptable for Xpert Xpress SARS-CoV-2/FLU/RSV testing.  Fact Sheet for Patients: EntrepreneurPulse.com.au  Fact Sheet for Healthcare Providers: IncredibleEmployment.be  This test is not yet approved or cleared by the [place] FDA and has been authorized for detection and/or diagnosis of SARS-CoV-2 by FDA under an Emergency Use Authorization (EUA). This EUA will remain in effect (meaning this test can be used) for the duration of the COVID-19 declaration under Section 564(b)(1) of the Act, 21 U.S.C. section 360bbb-3(b)(1), unless the authorization is terminated or revoked.  Performed at [facility], [place] [address], [place], [place] [address]   Blood culture (routine x 2)     Status: None   Collection Time: 09/15/22  8:00 PM   Specimen: BLOOD  Result Value Ref Range Status   Specimen Description BLOOD LEFT ANTECUBITAL  Final   Special Requests   Final    BOTTLES DRAWN AEROBIC AND ANAEROBIC Blood Culture adequate volume   Culture   Final    NO GROWTH 5 DAYS Performed at [facility], [place] [address] [place], [place] [address]    Report Status 09/20/2022 FINAL  Final    Today  Subjective    Colleen Medina morning reports she is feeling better, no nausea, no vomiting overnight, denies any abdominal pain, asking to go home today.    Objective   Blood pressure 123/70, pulse 73, temperature 98 F (36.7 C), temperature source Oral,  resp. rate 16, height '4\' 11"'$  (1.499 m), weight 51.3 kg, SpO2 95 %.   Intake/Output Summary (Last 24 hours) at 09/21/2022 1033 Last data filed at 09/21/2022 0300 Gross per 24 hour  Intake 164.78 ml  Output --  Net 164.78 ml    Exam  Awake Alert, No new F.N deficits,    [place].AT,PERRAL Supple Neck,   Symmetrical Chest wall movement, Good air movement bilaterally, CTAB RRR,No Gallops,   +ve B.Sounds, Abd Soft, Non tender,  No Cyanosis, Clubbing or edema    Data Review   Recent Labs  Lab 09/17/22 0849 09/18/22 0642 09/19/22 0145 09/20/22 0409 09/21/22 0505  WBC 4.0 4.5 5.5 5.1 5.1  HGB 9.7* 8.7* 9.4* 9.1* 9.7*  HCT 31.5* 28.0* 30.3* 29.4* 31.4*  PLT 201 180 198 199 208  MCV 78.9* 78.4* 79.3* 78.4* 77.0*  MCH 24.3* 24.4* 24.6* 24.3* 23.8*  MCHC 30.8 31.1 31.0 31.0 30.9  RDW 15.1 15.1 15.2 15.1 14.6  LYMPHSABS 0.9 1.1 1.2 1.0 1.0  MONOABS 0.4 0.5 0.4 0.4 0.5  EOSABS 0.2 0.2 0.2 0.1 0.2  BASOSABS 0.0 0.0 0.0 0.0 0.0    Recent Labs  Lab 09/15/22 1816 09/15/22 2000 09/16/22 0502 09/17/22 0849 09/18/22 0642 09/19/22 0145 09/20/22 0409 09/21/22 0505  NA 137  --  135 138  --  136 136 134*  K 3.4*  --  3.3* 4.1  --  3.9 3.7 3.2*  CL 97*  --  99 102  --  104 100 97*  CO2 21*  --  27 26  --  '23 22 24  '$ GLUCOSE 124*  --  95 106*  --  82 72 100*  BUN 26*  --  19 8  --  <5* 8 7*  CREATININE 1.75*  --  1.36* 1.28*  --  1.34* 1.31* 1.07*  CALCIUM 9.5  --  8.3* 8.9  --  8.7* 8.6* 9.0  AST 29  --  23  --   --   --   --  32  ALT 13  --  11  --   --   --   --  19  ALKPHOS 62  --  53  --   --   --   --  52  BILITOT 1.2  --  0.6  --   --   --   --  0.6  ALBUMIN 4.1  --  2.9* 3.0*  --   --   --  3.2*  MG  --   --   --  1.5* 2.1 1.8 1.5* 2.1  PHOS  --   --   --  2.8  --   --   --   --   CRP  --   --   --  0.6 <0.5 0.5 <0.5 0.5  LATICACIDVEN 5.5* 4.6* 1.5  --   --   --   --   --   INR 1.1  --   --   --   --   --   --   --   BNP  --   --   --   --  72.3 59.7 38.4  --     Total  Time in  preparing paper work, data evaluation and todays exam - 71 minutes  Phillips Climes M.D on 09/21/2022 at 10:33 AM  [facility]

## 2022-09-21 NOTE — Progress Notes (Signed)
Subjective: Patient was ready for discharge yesterday when she had an episode of feeling hot associated with abdominal pain and nausea. She had an abdominal x-ray there after which was unremarkable. Currently patient is without nausea and denies abdominal pain and wants to go home.  Objective: Vital signs in last 24 hours: Temp:  [98 F (36.7 C)-98.5 F (36.9 C)] 98 F (36.7 C) (10/04 0400) Pulse Rate:  [71-89] 73 (10/04 0826) Resp:  [16-24] 16 (10/04 0400) BP: (105-144)/(69-82) 123/70 (10/04 0826) SpO2:  [95 %-100 %] 95 % (10/04 0826) Weight change:  Last BM Date : 09/19/22  PE: Elderly GENERAL: Mild pallor  ABDOMEN: soft nondistended, nontender, normoactive bowel sounds EXTREMITIES: No deformity  Lab Results: Results for orders placed or performed during the hospital encounter of 09/15/22 (from the past 48 hour(s))  Magnesium     Status: Abnormal   Collection Time: 09/20/22  4:09 AM  Result Value Ref Range   Magnesium 1.5 (L) 1.7 - 2.4 mg/dL    Comment: Performed at [facility], [address], [place], [place] [address]  Brain natriuretic peptide     Status: None   Collection Time: 09/20/22  4:09 AM  Result Value Ref Range   B Natriuretic Peptide 38.4 0.0 - 100.0 pg/mL    Comment: Performed at [facility] [address] [place], [place] [address]  C-reactive protein     Status: None   Collection Time: 09/20/22  4:09 AM  Result Value Ref Range   CRP <0.5 <1.0 mg/dL    Comment: Performed at [facility], [place] [address] [place], [place] [address]  Basic metabolic panel     Status: Abnormal   Collection Time: 09/20/22  4:09 AM  Result Value Ref Range   Sodium 136 135 - 145 mmol/L   Potassium 3.7 3.5 - 5.1 mmol/L   Chloride 100 98 - 111 mmol/L   CO2 22 22 - 32 mmol/L   Glucose, Bld 72 70 - 99 mg/dL    Comment: Glucose reference range applies only to samples taken after fasting for at least 8 hours.   BUN 8 8 - 23 mg/dL   Creatinine, Ser 1.31 (H)  0.44 - 1.00 mg/dL   Calcium 8.6 (L) 8.9 - 10.3 mg/dL   GFR, Estimated 41 (L) >60 mL/min    Comment: (NOTE) Calculated using the CKD-EPI Creatinine Equation (2021)    Anion gap 14 5 - 15    Comment: Performed at [facility] [address], [place], [place] [address]  CBC with Differential/Platelet     Status: Abnormal   Collection Time: 09/20/22  4:09 AM  Result Value Ref Range   WBC 5.1 4.0 - 10.5 K/uL   RBC 3.75 (L) 3.87 - 5.11 MIL/uL   Hemoglobin 9.1 (L) 12.0 - 15.0 g/dL   HCT 29.4 (L) 36.0 - 46.0 %   MCV 78.4 (L) 80.0 - 100.0 fL   MCH 24.3 (L) 26.0 - 34.0 pg   MCHC 31.0 30.0 - 36.0 g/dL   RDW 15.1 11.5 - 15.5 %   Platelets 199 150 - 400 K/uL   nRBC 0.0 0.0 - 0.2 %   Neutrophils Relative % 70 %   Neutro Abs 3.5 1.7 - 7.7 K/uL   Lymphocytes Relative 20 %   Lymphs Abs 1.0 0.7 - 4.0 K/uL   Monocytes Relative 8 %   Monocytes Absolute 0.4 0.1 - 1.0 K/uL   Eosinophils Relative 2 %  Eosinophils Absolute 0.1 0.0 - 0.5 K/uL   Basophils Relative 0 %   Basophils Absolute 0.0 0.0 - 0.1 K/uL   Immature Granulocytes 0 %   Abs Immature Granulocytes 0.02 0.00 - 0.07 K/uL    Comment: Performed at [facility], [place] [address], [place], [place] [address]  Glucose, capillary     Status: Abnormal   Collection Time: 09/20/22  7:38 AM  Result Value Ref Range   Glucose-Capillary 63 (L) 70 - 99 mg/dL    Comment: Glucose reference range applies only to samples taken after fasting for at least 8 hours.  Glucose, capillary     Status: Abnormal   Collection Time: 09/20/22  8:00 AM  Result Value Ref Range   Glucose-Capillary 132 (H) 70 - 99 mg/dL    Comment: Glucose reference range applies only to samples taken after fasting for at least 8 hours.  Glucose, capillary     Status: Abnormal   Collection Time: 09/20/22  8:38 AM  Result Value Ref Range   Glucose-Capillary 122 (H) 70 - 99 mg/dL    Comment: Glucose reference range applies only to samples taken after fasting for at least 8  hours.  Glucose, capillary     Status: Abnormal   Collection Time: 09/20/22  9:18 AM  Result Value Ref Range   Glucose-Capillary 103 (H) 70 - 99 mg/dL    Comment: Glucose reference range applies only to samples taken after fasting for at least 8 hours.  Magnesium     Status: None   Collection Time: 09/21/22  5:05 AM  Result Value Ref Range   Magnesium 2.1 1.7 - 2.4 mg/dL    Comment: Performed at [facility], [place] [address], [place], [place] [address]  C-reactive protein     Status: None   Collection Time: 09/21/22  5:05 AM  Result Value Ref Range   CRP 0.5 <1.0 mg/dL    Comment: Performed at [facility] [address] [place], [place] [address]  CBC with Differential/Platelet     Status: Abnormal   Collection Time: 09/21/22  5:05 AM  Result Value Ref Range   WBC 5.1 4.0 - 10.5 K/uL   RBC 4.08 3.87 - 5.11 MIL/uL   Hemoglobin 9.7 (L) 12.0 - 15.0 g/dL   HCT 31.4 (L) 36.0 - 46.0 %   MCV 77.0 (L) 80.0 - 100.0 fL   MCH 23.8 (L) 26.0 - 34.0 pg   MCHC 30.9 30.0 - 36.0 g/dL   RDW 14.6 11.5 - 15.5 %   Platelets 208 150 - 400 K/uL   nRBC 0.0 0.0 - 0.2 %   Neutrophils Relative % 67 %   Neutro Abs 3.4 1.7 - 7.7 K/uL   Lymphocytes Relative 19 %   Lymphs Abs 1.0 0.7 - 4.0 K/uL   Monocytes Relative 10 %   Monocytes Absolute 0.5 0.1 - 1.0 K/uL   Eosinophils Relative 4 %   Eosinophils Absolute 0.2 0.0 - 0.5 K/uL   Basophils Relative 0 %   Basophils Absolute 0.0 0.0 - 0.1 K/uL   Immature Granulocytes 0 %   Abs Immature Granulocytes 0.01 0.00 - 0.07 K/uL    Comment: Performed at [facility] [address] [place],  [address]  Comprehensive metabolic panel     Status: Abnormal   Collection Time: 09/21/22  5:05 AM  Result Value Ref Range   Sodium 134 (L) 135 - 145 mmol/L   Potassium 3.2 (L)  3.5 - 5.1 mmol/L   Chloride 97 (L) 98 - 111 mmol/L   CO2 24 22 - 32 mmol/L   Glucose, Bld 100 (H) 70 - 99 mg/dL    Comment: Glucose reference range applies only to  samples taken after fasting for at least 8 hours.   BUN 7 (L) 8 - 23 mg/dL   Creatinine, Ser 1.07 (H) 0.44 - 1.00 mg/dL   Calcium 9.0 8.9 - 10.3 mg/dL   Total Protein 5.7 (L) 6.5 - 8.1 g/dL   Albumin 3.2 (L) 3.5 - 5.0 g/dL   AST 32 15 - 41 U/L   ALT 19 0 - 44 U/L   Alkaline Phosphatase 52 38 - 126 U/L   Total Bilirubin 0.6 0.3 - 1.2 mg/dL   GFR, Estimated 53 (L) >60 mL/min    Comment: (NOTE) Calculated using the CKD-EPI Creatinine Equation (2021)    Anion gap 13 5 - 15    Comment: Performed at [facility], [place] [address], [place], [place] [address]    Studies/Results: DG Abd Portable 1V  Result Date: 09/21/2022 CLINICAL DATA:  Persistent abdominal pain since colonoscopy yesterday. EXAM: PORTABLE ABDOMEN - 1 VIEW COMPARISON:  Abdominal radiograph 07/21/2022; CT abdomen pelvis 09/15/2022 FINDINGS: The bowel gas pattern is normal. No findings of free intraperitoneal air given the limits of this single supine image. Postsurgical changes noted in the mid left abdomen. No radio-opaque calculi or other significant radiographic abnormality are seen. Surgical clips overlie the right upper abdomen. IMPRESSION: Normal bowel-gas pattern. No findings of free intraperitoneal air on this single supine image. Electronically Signed   By: Ileana Roup M.D.   On: 09/21/2022 09:28   DG Abd Portable 1V  Result Date: 09/20/2022 CLINICAL DATA:  Abdominal pain. EXAM: PORTABLE ABDOMEN - 1 VIEW COMPARISON:  September 15, 2022 FINDINGS: The bowel gas pattern is normal. A mild amount of stool is seen throughout the large bowel. Radiopaque surgical clips are seen overlying the right upper quadrant. Numerous phleboliths are seen within the lower pelvis. No radio-opaque calculi or other significant radiographic abnormality are seen. IMPRESSION: Negative. Electronically Signed   By: Virgina Norfolk M.D.   On: 09/20/2022 17:17    Medications: I have reviewed the patient's current  medications.  Assessment: Nonspecific inflammation noted on CAT scan Colonoscopy yesterday showed diverticulosis Random biopsies taken for microscopic colitis Microcytic anemia  Plan: Discussed with the hospitalist Dr. [facility], plan is to repeat an abdominal x-ray today morning, if unremarkable plan is to discharge home on simethicone. Biopsies can be followed as outpatient.  Ronnette Juniper, MD 09/21/2022, 9:55 AM

## 2022-09-21 NOTE — Progress Notes (Signed)
Explained discharge instructions to patient. Reviewed follow up appointment and next medication administration times. Also reviewed education. Patient verbalized having an understanding for instructions given. All belongings are in the patient's possession to include TOC meds. IV and telemetry were removed. CCMD was notified. No other needs verbalized. Transported downstairs for discharge. 

## 2022-09-22 LAB — SURGICAL PATHOLOGY

## 2022-09-25 ENCOUNTER — Encounter (HOSPITAL_COMMUNITY): Payer: Self-pay | Admitting: Gastroenterology

## 2022-11-07 ENCOUNTER — Encounter: Payer: Self-pay | Admitting: Internal Medicine

## 2022-11-07 ENCOUNTER — Ambulatory Visit (INDEPENDENT_AMBULATORY_CARE_PROVIDER_SITE_OTHER): Payer: Medicare HMO | Admitting: Internal Medicine

## 2022-11-07 ENCOUNTER — Ambulatory Visit (INDEPENDENT_AMBULATORY_CARE_PROVIDER_SITE_OTHER): Payer: Medicare HMO

## 2022-11-07 VITALS — BP 156/70 | HR 62 | Ht 59.0 in | Wt 102.5 lb

## 2022-11-07 VITALS — BP 164/76 | HR 65 | Ht 59.0 in | Wt 102.5 lb

## 2022-11-07 DIAGNOSIS — Z0000 Encounter for general adult medical examination without abnormal findings: Secondary | ICD-10-CM | POA: Diagnosis not present

## 2022-11-07 DIAGNOSIS — E7800 Pure hypercholesterolemia, unspecified: Secondary | ICD-10-CM

## 2022-11-07 DIAGNOSIS — D1330 Benign neoplasm of unspecified part of small intestine: Secondary | ICD-10-CM

## 2022-11-07 DIAGNOSIS — E1143 Type 2 diabetes mellitus with diabetic autonomic (poly)neuropathy: Secondary | ICD-10-CM

## 2022-11-07 DIAGNOSIS — D509 Iron deficiency anemia, unspecified: Secondary | ICD-10-CM

## 2022-11-07 DIAGNOSIS — K529 Noninfective gastroenteritis and colitis, unspecified: Secondary | ICD-10-CM | POA: Diagnosis not present

## 2022-11-07 DIAGNOSIS — I10 Essential (primary) hypertension: Secondary | ICD-10-CM

## 2022-11-07 DIAGNOSIS — E559 Vitamin D deficiency, unspecified: Secondary | ICD-10-CM

## 2022-11-07 DIAGNOSIS — Z23 Encounter for immunization: Secondary | ICD-10-CM | POA: Diagnosis not present

## 2022-11-07 MED ORDER — ZOSTER VAC RECOMB ADJUVANTED 50 MCG/0.5ML IM SUSR: 0.5000 mL | Freq: Once | INTRAMUSCULAR | 1 refills | Status: AC

## 2022-11-07 MED ORDER — VITAMIN B-12 1000 MCG PO TABS: 1000.0000 ug | ORAL_TABLET | Freq: Every day | ORAL | 2 refills | Status: AC

## 2022-11-07 NOTE — Assessment & Plan Note (Signed)
Historical diagnosis without revealing work-up, thought to be possibly due to thalassemia. CBC during recent admission showed Hgb stable in the 9's and MCV of 77. Iron studies at that time were normal however her vitamin B12 was low at 164. Plan: Repeat CBC. Start vitamin B12 1000 mcg daily.

## 2022-11-07 NOTE — Assessment & Plan Note (Signed)
Flu vaccine given today. 

## 2022-11-07 NOTE — Assessment & Plan Note (Signed)
Lipid panel last checked 05/2020 showed LDL at goal, 59. Current regimen is rosuvastatin 40 mg daily. Plan: Check lipid panel today.
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
## 2022-11-07 NOTE — Assessment & Plan Note (Signed)
Pneumonia vaccine given today.  

## 2022-11-07 NOTE — Assessment & Plan Note (Signed)
Patient was recently admitted with acute colitis. She had a colonoscopy done while admitted which showed microscopic colitis and was started on Metamucil BID. She was recommended to follow up with GI which she has not been able to do as she has not received a call to schedule this appointment.  Plan: Referral to GI placed.

## 2022-11-07 NOTE — Assessment & Plan Note (Signed)
Not currently taking supplementation. Last checked 11/2017 and was low at 10.7 Plan:Check vitamin D level.

## 2022-11-07 NOTE — Patient Instructions (Addendum)
Ms. Osorno,  It was a pleasure to care for you today!  You received your pneumonia and flu vaccines today. I have also sent in a prescription for the shingles vaccine that you can get at your pharmacy.  I am getting blood work today and will let you know what these results show.  I have placed a referral to GI and they will call you to schedule an appointment.  I would like you to return to clinic in about 2 weeks so I can recheck your blood pressure. Make sure to take your medication before you come in and please bring both a log of your daily blood pressure readings and your blood pressure cuff.  Happy Thanksgiving! Dr. Marlou Sa

## 2022-11-07 NOTE — Assessment & Plan Note (Signed)
BP not controlled today, 164/76 with recheck unchanged. Current regimen is amlodipine 10 mg daily, hydralazine 50 mg TID. She has a BP cuff at home and checks her BP daily; when she checked yesterday it measured 161/71. She says that prior to recent admission it was normal but since discharge it has been elevated consistently. The highest that she has seen was SBP 180, lowest that she has seen was SBP 122. She has not taken any medications this morning. Plan:Will not change current regimen and instead will recheck BP in about 2 weeks. Will ask her to keep a log of her BP and bring that in with her, as well as her home BP cuff.

## 2022-11-07 NOTE — Assessment & Plan Note (Addendum)
HbA1c last checked 01/2022 of 6.0%. She took medication for gestational diabetes but has not been on medication since then as she has been diet controlled. Her last eye exam was in March of this year. Plan:Check HbA1c, BMP, and urine protein studies today.
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
## 2022-11-07 NOTE — Assessment & Plan Note (Signed)
Prescription sent for shingles vaccines.

## 2022-11-08 LAB — BMP8+ANION GAP
Anion Gap: 16 mmol/L (ref 10.0–18.0)
BUN/Creatinine Ratio: 19 (ref 12–28)
BUN: 14 mg/dL (ref 8–27)
CO2: 24 mmol/L (ref 20–29)
Calcium: 9.6 mg/dL (ref 8.7–10.3)
Chloride: 101 mmol/L (ref 96–106)
Creatinine, Ser: 0.74 mg/dL (ref 0.57–1.00)
Glucose: 99 mg/dL (ref 70–99)
Potassium: 3.8 mmol/L (ref 3.5–5.2)
Sodium: 141 mmol/L (ref 134–144)
eGFR: 82 mL/min/{1.73_m2} (ref >59)

## 2022-11-08 LAB — HEMOGLOBIN A1C
Est. average glucose Bld gHb Est-mCnc: 131 mg/dL
Hgb A1c MFr Bld: 6.2 % — ABNORMAL HIGH (ref 4.8–5.6)

## 2022-11-08 LAB — MICROALBUMIN / CREATININE URINE RATIO
Creatinine, Urine: 117 mg/dL
Microalb/Creat Ratio: 9 mg/g creat (ref 0–29)
Microalbumin, Urine: 10.7 ug/mL

## 2022-11-08 LAB — CBC
Hematocrit: 32.1 % — ABNORMAL LOW (ref 34.0–46.6)
Hemoglobin: 9.7 g/dL — ABNORMAL LOW (ref 11.1–15.9)
MCH: 24.6 pg — ABNORMAL LOW (ref 26.6–33.0)
MCHC: 30.2 g/dL — ABNORMAL LOW (ref 31.5–35.7)
MCV: 82 fL (ref 79–97)
Platelets: 244 10*3/uL (ref 150–450)
RBC: 3.94 x10E6/uL (ref 3.77–5.28)
RDW: 14 % (ref 11.7–15.4)
WBC: 3.4 10*3/uL (ref 3.4–10.8)

## 2022-11-08 LAB — LIPID PANEL
Chol/HDL Ratio: 3.6 ratio (ref 0.0–4.4)
Cholesterol, Total: 234 mg/dL — ABNORMAL HIGH (ref 100–199)
HDL: 65 mg/dL (ref >39)
LDL Chol Calc (NIH): 156 mg/dL — ABNORMAL HIGH (ref 0–99)
Triglycerides: 76 mg/dL (ref 0–149)
VLDL Cholesterol Cal: 13 mg/dL (ref 5–40)

## 2022-11-08 LAB — VITAMIN D 25 HYDROXY (VIT D DEFICIENCY, FRACTURES): Vit D, 25-Hydroxy: 20.3 ng/mL — ABNORMAL LOW (ref 30.0–100.0)

## 2022-11-09 ENCOUNTER — Telehealth: Payer: Self-pay | Admitting: Internal Medicine

## 2022-11-09 MED ORDER — VITAMIN D3 10 MCG (400 UNIT) PO TABS: 800.0000 [IU] | ORAL_TABLET | Freq: Every day | ORAL | 3 refills | Status: AC

## 2022-11-09 NOTE — Progress Notes (Signed)
Internal Medicine Clinic Attending  Case discussed with the resident at the time of the visit.  We reviewed the resident's history and exam and pertinent patient test results.  I agree with the assessment, diagnosis, and plan of care documented in the resident's note.  

## 2022-11-09 NOTE — Telephone Encounter (Signed)
Discussed lab results with patient. I have sent in a prescription for vitamin D supplementation. In discussing her elevated cholesterol, Colleen Medina would like to try diet changes and increased exercise over the next few months before adding Zetia, but understands that continued elevations in LDL will prompt additional discussion and starting of this medication.   Farrel Gordon, DO

## 2022-11-14 NOTE — Addendum Note (Signed)
Addended by: Renato Battles on: 11/14/2022 12:45 PM   Modules accepted: Level of Service

## 2022-11-21 ENCOUNTER — Encounter: Payer: Medicare HMO | Admitting: Internal Medicine

## 2022-11-21 NOTE — Progress Notes (Deleted)
HTN F/U: BP at last OV was above goal and patient reported varying home BP measurements of SBP 120s-160s; in office, her BP was 164/76 with no change on recheck. Because of the fact that she had not taken her home medications of amlodipine 10 mg daily and hydralazine 50 mg TID, and her reported varied home readings, no changes to medications were made and she was instead asked to keep a home log for 2 weeks and return for re-evaluation. Today BP is *. Plan:

## 2023-01-11 DIAGNOSIS — Z9889 Other specified postprocedural states: Secondary | ICD-10-CM | POA: Diagnosis not present

## 2023-01-11 DIAGNOSIS — K529 Noninfective gastroenteritis and colitis, unspecified: Secondary | ICD-10-CM | POA: Diagnosis not present

## 2023-01-11 DIAGNOSIS — K219 Gastro-esophageal reflux disease without esophagitis: Secondary | ICD-10-CM | POA: Diagnosis not present

## 2023-01-11 DIAGNOSIS — D509 Iron deficiency anemia, unspecified: Secondary | ICD-10-CM | POA: Diagnosis not present

## 2023-01-11 DIAGNOSIS — E538 Deficiency of other specified B group vitamins: Secondary | ICD-10-CM | POA: Diagnosis not present

## 2023-01-11 DIAGNOSIS — K5900 Constipation, unspecified: Secondary | ICD-10-CM | POA: Diagnosis not present

## 2023-03-07 DIAGNOSIS — Z961 Presence of intraocular lens: Secondary | ICD-10-CM | POA: Diagnosis not present

## 2023-03-07 DIAGNOSIS — H524 Presbyopia: Secondary | ICD-10-CM | POA: Diagnosis not present

## 2023-03-07 DIAGNOSIS — H5203 Hypermetropia, bilateral: Secondary | ICD-10-CM | POA: Diagnosis not present

## 2023-03-07 DIAGNOSIS — H52223 Regular astigmatism, bilateral: Secondary | ICD-10-CM | POA: Diagnosis not present

## 2023-03-07 DIAGNOSIS — Z135 Encounter for screening for eye and ear disorders: Secondary | ICD-10-CM | POA: Diagnosis not present

## 2023-03-07 DIAGNOSIS — H04123 Dry eye syndrome of bilateral lacrimal glands: Secondary | ICD-10-CM | POA: Diagnosis not present

## 2023-04-04 DIAGNOSIS — Z96651 Presence of right artificial knee joint: Secondary | ICD-10-CM | POA: Diagnosis not present

## 2023-04-04 DIAGNOSIS — M7051 Other bursitis of knee, right knee: Secondary | ICD-10-CM | POA: Diagnosis not present

## 2023-04-25 DIAGNOSIS — M25561 Pain in right knee: Secondary | ICD-10-CM | POA: Diagnosis not present

## 2023-04-25 DIAGNOSIS — S8001XD Contusion of right knee, subsequent encounter: Secondary | ICD-10-CM | POA: Diagnosis not present

## 2023-05-03 ENCOUNTER — Telehealth: Payer: Self-pay | Admitting: Internal Medicine

## 2023-05-03 DIAGNOSIS — M25561 Pain in right knee: Secondary | ICD-10-CM | POA: Diagnosis not present

## 2023-05-03 DIAGNOSIS — S8001XD Contusion of right knee, subsequent encounter: Secondary | ICD-10-CM | POA: Diagnosis not present

## 2023-05-03 NOTE — Telephone Encounter (Signed)
Contacted Colleen Medina to schedule their annual wellness visit. Appointment made for 05/17/2023.  [facility] Care Guide [facility] AWV TEAM Direct Dial: [phone]

## 2023-05-05 DIAGNOSIS — S8001XD Contusion of right knee, subsequent encounter: Secondary | ICD-10-CM | POA: Diagnosis not present

## 2023-05-05 DIAGNOSIS — M25561 Pain in right knee: Secondary | ICD-10-CM | POA: Diagnosis not present

## 2023-05-09 DIAGNOSIS — S8001XD Contusion of right knee, subsequent encounter: Secondary | ICD-10-CM | POA: Diagnosis not present

## 2023-05-09 DIAGNOSIS — M25561 Pain in right knee: Secondary | ICD-10-CM | POA: Diagnosis not present

## 2023-05-12 DIAGNOSIS — M25561 Pain in right knee: Secondary | ICD-10-CM | POA: Diagnosis not present

## 2023-05-12 DIAGNOSIS — S8001XD Contusion of right knee, subsequent encounter: Secondary | ICD-10-CM | POA: Diagnosis not present

## 2023-05-16 DIAGNOSIS — M25561 Pain in right knee: Secondary | ICD-10-CM | POA: Diagnosis not present

## 2023-05-16 DIAGNOSIS — S8001XD Contusion of right knee, subsequent encounter: Secondary | ICD-10-CM | POA: Diagnosis not present

## 2023-05-18 DIAGNOSIS — S8001XD Contusion of right knee, subsequent encounter: Secondary | ICD-10-CM | POA: Diagnosis not present

## 2023-05-18 DIAGNOSIS — M25561 Pain in right knee: Secondary | ICD-10-CM | POA: Diagnosis not present

## 2023-09-06 NOTE — Progress Notes (Signed)
Internal Medicine Clinic Attending  Case and documentation of Dr. August Saucer reviewed.  I reviewed the AWV findings.  I agree with the assessment, diagnosis, and plan of care documented in the AWV note.

## 2023-11-21 ENCOUNTER — Other Ambulatory Visit: Payer: Self-pay | Admitting: Gastroenterology

## 2023-11-21 DIAGNOSIS — R1313 Dysphagia, pharyngeal phase: Secondary | ICD-10-CM

## 2023-12-04 ENCOUNTER — Encounter: Payer: Medicare HMO | Admitting: Internal Medicine

## 2024-11-24 ENCOUNTER — Observation Stay (HOSPITAL_BASED_OUTPATIENT_CLINIC_OR_DEPARTMENT_OTHER)
Admission: EM | Admit: 2024-11-24 | Discharge: 2024-11-25 | Disposition: A | Attending: Internal Medicine | Admitting: Internal Medicine

## 2024-11-24 ENCOUNTER — Other Ambulatory Visit: Payer: Self-pay

## 2024-11-24 ENCOUNTER — Emergency Department (HOSPITAL_BASED_OUTPATIENT_CLINIC_OR_DEPARTMENT_OTHER)

## 2024-11-24 ENCOUNTER — Encounter (HOSPITAL_BASED_OUTPATIENT_CLINIC_OR_DEPARTMENT_OTHER): Payer: Self-pay

## 2024-11-24 DIAGNOSIS — J209 Acute bronchitis, unspecified: Secondary | ICD-10-CM | POA: Diagnosis present

## 2024-11-24 DIAGNOSIS — Z743 Need for continuous supervision: Secondary | ICD-10-CM | POA: Diagnosis not present

## 2024-11-24 DIAGNOSIS — J4520 Mild intermittent asthma, uncomplicated: Secondary | ICD-10-CM

## 2024-11-24 DIAGNOSIS — J45901 Unspecified asthma with (acute) exacerbation: Secondary | ICD-10-CM | POA: Diagnosis present

## 2024-11-24 DIAGNOSIS — R0602 Shortness of breath: Secondary | ICD-10-CM | POA: Diagnosis not present

## 2024-11-24 DIAGNOSIS — J4521 Mild intermittent asthma with (acute) exacerbation: Secondary | ICD-10-CM | POA: Diagnosis not present

## 2024-11-24 DIAGNOSIS — D509 Iron deficiency anemia, unspecified: Secondary | ICD-10-CM | POA: Diagnosis present

## 2024-11-24 DIAGNOSIS — I10 Essential (primary) hypertension: Secondary | ICD-10-CM | POA: Diagnosis present

## 2024-11-24 LAB — BASIC METABOLIC PANEL WITH GFR
Anion gap: 12 (ref 5–15)
BUN: 13 mg/dL (ref 8–23)
CO2: 27 mmol/L (ref 22–32)
Calcium: 10 mg/dL (ref 8.9–10.3)
Chloride: 101 mmol/L (ref 98–111)
Creatinine, Ser: 0.93 mg/dL (ref 0.44–1.00)
GFR, Estimated: >60 mL/min (ref >60)
Glucose, Bld: 110 mg/dL — ABNORMAL HIGH (ref 70–99)
Potassium: 3.7 mmol/L (ref 3.5–5.1)
Sodium: 140 mmol/L (ref 135–145)

## 2024-11-24 LAB — I-STAT VENOUS BLOOD GAS, ED
Acid-Base Excess: 6 mmol/L — ABNORMAL HIGH (ref 0.0–2.0)
Bicarbonate: 27.2 mmol/L (ref 20.0–28.0)
Calcium, Ion: 1.07 mmol/L — ABNORMAL LOW (ref 1.15–1.40)
HCT: 31 % — ABNORMAL LOW (ref 36.0–46.0)
Hemoglobin: 10.5 g/dL — ABNORMAL LOW (ref 12.0–15.0)
O2 Saturation: 97 %
Patient temperature: 98.4
Potassium: 3.9 mmol/L (ref 3.5–5.1)
Sodium: 137 mmol/L (ref 135–145)
TCO2: 28 mmol/L (ref 22–32)
pCO2, Ven: 28.7 mmHg — ABNORMAL LOW (ref 44–60)
pH, Ven: 7.584 — ABNORMAL HIGH (ref 7.25–7.43)
pO2, Ven: 78 mmHg — ABNORMAL HIGH (ref 32–45)

## 2024-11-24 LAB — CBC WITH DIFFERENTIAL/PLATELET
Abs Immature Granulocytes: 0.03 K/uL (ref 0.00–0.07)
Basophils Absolute: 0 K/uL (ref 0.0–0.1)
Basophils Relative: 0 %
Eosinophils Absolute: 0.1 K/uL (ref 0.0–0.5)
Eosinophils Relative: 2 %
HCT: 31.9 % — ABNORMAL LOW (ref 36.0–46.0)
Hemoglobin: 9.7 g/dL — ABNORMAL LOW (ref 12.0–15.0)
Immature Granulocytes: 1 %
Lymphocytes Relative: 28 %
Lymphs Abs: 1.7 K/uL (ref 0.7–4.0)
MCH: 24 pg — ABNORMAL LOW (ref 26.0–34.0)
MCHC: 30.4 g/dL (ref 30.0–36.0)
MCV: 78.8 fL — ABNORMAL LOW (ref 80.0–100.0)
Monocytes Absolute: 0.4 K/uL (ref 0.1–1.0)
Monocytes Relative: 7 %
Neutro Abs: 3.8 K/uL (ref 1.7–7.7)
Neutrophils Relative %: 62 %
Platelets: 310 K/uL (ref 150–400)
RBC: 4.05 MIL/uL (ref 3.87–5.11)
RDW: 14.6 % (ref 11.5–15.5)
WBC: 6.1 K/uL (ref 4.0–10.5)
nRBC: 0 % (ref 0.0–0.2)

## 2024-11-24 LAB — RESP PANEL BY RT-PCR (RSV, FLU A&B, COVID)  RVPGX2
Influenza A by PCR: NEGATIVE
Influenza B by PCR: NEGATIVE
Resp Syncytial Virus by PCR: NEGATIVE
SARS Coronavirus 2 by RT PCR: NEGATIVE

## 2024-11-24 LAB — PRO BRAIN NATRIURETIC PEPTIDE: Pro Brain Natriuretic Peptide: 211 pg/mL (ref <300.0)

## 2024-11-24 MED ORDER — INSULIN ASPART 100 UNIT/ML IJ SOLN
0.0000 [IU] | Freq: Three times a day (TID) | INTRAMUSCULAR | Status: DC
  Administered 2024-11-25: 2 [IU] via SUBCUTANEOUS
  Administered 2024-11-25: 3 [IU] via SUBCUTANEOUS
  Filled 2024-11-24: qty 3
  Filled 2024-11-24: qty 2

## 2024-11-24 MED ORDER — ENOXAPARIN SODIUM 30 MG/0.3ML IJ SOSY
30.0000 mg | PREFILLED_SYRINGE | INTRAMUSCULAR | Status: DC
  Administered 2024-11-25: 30 mg via SUBCUTANEOUS
  Filled 2024-11-24: qty 0.3

## 2024-11-24 MED ORDER — SODIUM CHLORIDE 0.9% FLUSH
3.0000 mL | Freq: Two times a day (BID) | INTRAVENOUS | Status: DC
  Administered 2024-11-24 – 2024-11-25 (×2): 3 mL via INTRAVENOUS

## 2024-11-24 MED ORDER — METHYLPREDNISOLONE SODIUM SUCC 125 MG IJ SOLR
125.0000 mg | Freq: Once | INTRAMUSCULAR | Status: AC
  Administered 2024-11-24: 125 mg via INTRAVENOUS
  Filled 2024-11-24: qty 2

## 2024-11-24 MED ORDER — METHYLPREDNISOLONE SODIUM SUCC 40 MG IJ SOLR
40.0000 mg | Freq: Two times a day (BID) | INTRAMUSCULAR | Status: DC
  Administered 2024-11-25: 40 mg via INTRAVENOUS
  Filled 2024-11-24: qty 1

## 2024-11-24 MED ORDER — ARFORMOTEROL TARTRATE 15 MCG/2ML IN NEBU
15.0000 ug | INHALATION_SOLUTION | Freq: Two times a day (BID) | RESPIRATORY_TRACT | Status: DC
  Administered 2024-11-24 – 2024-11-25 (×2): 15 ug via RESPIRATORY_TRACT
  Filled 2024-11-24 (×2): qty 2

## 2024-11-24 MED ORDER — IPRATROPIUM-ALBUTEROL 0.5-2.5 (3) MG/3ML IN SOLN
3.0000 mL | Freq: Once | RESPIRATORY_TRACT | Status: AC
  Administered 2024-11-24: 3 mL via RESPIRATORY_TRACT
  Filled 2024-11-24: qty 3

## 2024-11-24 MED ORDER — GUAIFENESIN ER 600 MG PO TB12
600.0000 mg | ORAL_TABLET | Freq: Two times a day (BID) | ORAL | Status: DC
  Administered 2024-11-24: 600 mg via ORAL
  Filled 2024-11-24: qty 1

## 2024-11-24 MED ORDER — MAGNESIUM SULFATE 2 GM/50ML IV SOLN
2.0000 g | Freq: Once | INTRAVENOUS | Status: AC
  Administered 2024-11-24: 2 g via INTRAVENOUS

## 2024-11-24 MED ORDER — POLYETHYLENE GLYCOL 3350 17 G PO PACK
17.0000 g | PACK | Freq: Every day | ORAL | Status: DC | PRN
  Administered 2024-11-25: 17 g via ORAL
  Filled 2024-11-24: qty 1

## 2024-11-24 MED ORDER — ONDANSETRON HCL 4 MG PO TABS: 4.0000 mg | ORAL_TABLET | Freq: Four times a day (QID) | ORAL | Status: DC | PRN

## 2024-11-24 MED ORDER — IPRATROPIUM-ALBUTEROL 0.5-2.5 (3) MG/3ML IN SOLN: 3.0000 mL | Freq: Four times a day (QID) | RESPIRATORY_TRACT | Status: DC | PRN

## 2024-11-24 MED ORDER — ACETAMINOPHEN 650 MG RE SUPP: 650.0000 mg | Freq: Four times a day (QID) | RECTAL | Status: DC | PRN

## 2024-11-24 MED ORDER — MAGNESIUM SULFATE 50 % IJ SOLN
2.0000 g | Freq: Once | INTRAMUSCULAR | Status: DC
  Filled 2024-11-24: qty 4

## 2024-11-24 MED ORDER — BUDESONIDE 0.25 MG/2ML IN SUSP
0.2500 mg | Freq: Two times a day (BID) | RESPIRATORY_TRACT | Status: DC
  Administered 2024-11-24 – 2024-11-25 (×2): 0.25 mg via RESPIRATORY_TRACT
  Filled 2024-11-24 (×2): qty 2

## 2024-11-24 MED ORDER — HYDRALAZINE HCL 20 MG/ML IJ SOLN
10.0000 mg | INTRAMUSCULAR | Status: DC | PRN
  Administered 2024-11-25: 10 mg via INTRAVENOUS
  Filled 2024-11-24: qty 1

## 2024-11-24 MED ORDER — ALBUTEROL SULFATE (2.5 MG/3ML) 0.083% IN NEBU
INHALATION_SOLUTION | RESPIRATORY_TRACT | Status: AC
  Administered 2024-11-24: 2.5 mg
  Filled 2024-11-24: qty 3

## 2024-11-24 MED ORDER — ONDANSETRON HCL 4 MG/2ML IJ SOLN: 4.0000 mg | Freq: Four times a day (QID) | INTRAMUSCULAR | Status: DC | PRN

## 2024-11-24 MED ORDER — ACETAMINOPHEN 325 MG PO TABS: 650.0000 mg | ORAL_TABLET | Freq: Four times a day (QID) | ORAL | Status: DC | PRN

## 2024-11-24 MED ORDER — HYDROXYZINE HCL 10 MG PO TABS: 10.0000 mg | ORAL_TABLET | Freq: Three times a day (TID) | ORAL | Status: DC | PRN

## 2024-11-24 NOTE — Hospital Course (Signed)
 Colleen Medina is a 83 y.o. female with medical history significant for mild intermittent asthma, HTN, HLD, diet-controlled T2DM, chronic microcytic anemia who is admitted with acute asthma exacerbation.

## 2024-11-24 NOTE — Plan of Care (Signed)
Discuss and review plan of care with patient/family  

## 2024-11-24 NOTE — ED Triage Notes (Signed)
 She c/o cough and shortness of breath x 1 week which is worse today. R.T. Vina meets and assesses her upon arrival.
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
## 2024-11-24 NOTE — ED Provider Notes (Signed)
 [place] EMERGENCY DEPARTMENT AT [facility] Provider Note   CSN: [id number] Arrival date & time: 11/24/24  1812     Patient presents with: Shortness of Breath   Colleen Medina is a 83 y.o. female.   83 year old female with past medical history of asthma, hypertension, diabetes, and hyperlipidemia presenting to the emergency department today with shortness of breath.  Patient states she has been short of breath now for the past week or so.  She recently ran out of albuterol  and states that her symptoms got worse.  Saw her doctor on Monday and received a breathing treatment at the office.  Patient reports that her symptoms have worsened since she ran out of albuterol  nebulizers 2 days ago.  She reports that is coughing up a lot of yellow sputum.  Denies any hemoptysis.  Denies any associated chest pain.  She has been hospitalized in the past for asthma but never intubated.   Shortness of Breath Associated symptoms: cough        Prior to Admission medications   Medication Sig Start Date End Date Taking? Authorizing Provider  albuterol  (PROAIR  HFA) 108 (90 Base) MCG/ACT inhaler Inhale 1-2 puffs into the lungs every 6 (six) hours as needed for shortness of breath. 04/18/19   Leopold Damien NOVAK, MD  amLODipine  (NORVASC ) 10 MG tablet Take 1 tablet (10 mg total) by mouth daily. 09/20/22   Singh, Prashant K, MD  aspirin  81 MG chewable tablet Chew 1 tablet (81 mg total) by mouth 2 (two) times daily with a meal. 09/20/22 11/04/22  Dennise Lavada POUR, MD  Cholecalciferol (VITAMIN D3) 10 MCG (400 UNIT) tablet Take 2 tablets (800 Units total) by mouth daily. 11/09/22   Addie Damien, DO  cyanocobalamin  (VITAMIN B12) 1000 MCG tablet Take 1 tablet (1,000 mcg total) by mouth daily. 11/07/22   Addie Damien, DO  dicyclomine  (BENTYL ) 20 MG tablet Take 1 tablet (20 mg total) by mouth 2 (two) times daily as needed for spasms (abdominal pain). 09/06/22   Dreama Longs, MD  DULoxetine  (CYMBALTA ) 30 MG  capsule Take 1 capsule (30 mg total) by mouth daily for 7 days, THEN 2 capsules (60 mg total) daily. Patient taking differently: Take 30 mg by mouth daily 02/24/21 09/17/23  Sherlean Failing, MD  fluticasone  (FLONASE ) 50 MCG/ACT nasal spray Place 1 spray into both nostrils daily. Patient taking differently: Place 1 spray into both nostrils daily as needed for allergies. 01/15/18   Feliberto Cambric, MD  hydrALAZINE  (APRESOLINE ) 50 MG tablet Take 1 tablet (50 mg total) by mouth every 8 (eight) hours. 09/20/22   Singh, Prashant K, MD  meloxicam  (MOBIC ) 7.5 MG tablet Take 7.5 mg by mouth daily.    [provider]  omeprazole  (PRILOSEC) 40 MG capsule Take 40 mg by mouth daily as needed (for acid reflux). 09/07/21   [provider]  omeprazole  (PRILOSEC) 40 MG capsule Take 40 mg by mouth daily.    [provider]  psyllium (HYDROCIL/METAMUCIL) 95 % PACK Take 1 packet by mouth 2 (two) times daily. 09/20/22   Singh, Prashant K, MD  rosuvastatin  (CRESTOR ) 40 MG tablet Take 40 mg by mouth daily.    [provider]  simethicone  (GAS-X) 80 MG chewable tablet Chew 1 tablet (80 mg total) by mouth 4 (four) times daily as needed for flatulence. 09/21/22 09/21/23  Elgergawy, Brayton RAMAN, MD    Allergies: Sulfonamide derivatives, Sulfa antibiotics, and Penicillins    Review of Systems  Respiratory:  Positive for cough  and shortness of breath.   All other systems reviewed and are negative.   Updated Vital Signs BP (!) 153/78   Pulse 94   Temp 98.4 F (36.9 C) (Oral)   Resp 15   SpO2 92%   Physical Exam Vitals and nursing note reviewed.   Gen: The patient is tachypneic with conversational dyspnea noted with increased work of breathing Eyes: PERRL, EOMI HEENT: no oropharyngeal swelling Neck: trachea midline Resp: Diminished with wheezes throughout all lung fields with right greater than left wheezes Card: RRR, no murmurs, rubs, or gallops Abd: nontender, nondistended Extremities:  no calf tenderness, no edema Vascular: 2+ radial pulses bilaterally, 2+ DP pulses bilaterally Skin: no rashes Psyc: acting appropriately   (all labs ordered are listed, but only abnormal results are displayed) Labs Reviewed  BASIC METABOLIC PANEL WITH GFR - Abnormal; Notable for the following components:      Result Value   Glucose, Bld 110 (*)    All other components within normal limits  CBC WITH DIFFERENTIAL/PLATELET - Abnormal; Notable for the following components:   Hemoglobin 9.7 (*)    HCT 31.9 (*)    MCV 78.8 (*)    MCH 24.0 (*)    All other components within normal limits  I-STAT VENOUS BLOOD GAS, ED - Abnormal; Notable for the following components:   pH, Ven 7.584 (*)    pCO2, Ven 28.7 (*)    pO2, Ven 78 (*)    Acid-Base Excess 6.0 (*)    Calcium , Ion 1.07 (*)    HCT 31.0 (*)    Hemoglobin 10.5 (*)    All other components within normal limits  RESP PANEL BY RT-PCR (RSV, FLU A&B, COVID)  RVPGX2  PRO BRAIN NATRIURETIC PEPTIDE    EKG: EKG Interpretation Date/Time:  Sunday November 24 2024 18:29:40 EST Ventricular Rate:  91 PR Interval:  122 QRS Duration:  68 QT Interval:  384 QTC Calculation: 472 R Axis:   -14  Text Interpretation: Normal sinus rhythm Low voltage QRS T wave abnormality, consider anterolateral ischemia Prolonged QT Abnormal ECG When compared with ECG of 20-Sep-2022 16:11, Premature supraventricular complexes are no longer Present T wave inversion now evident in Inferior leads T wave inversion now evident in Lateral leads Confirmed by Ula Barter ([id number]) on 11/24/2024 7:05:26 PM  Radiology: [facility] Chest Port 1 View Result Date: 11/24/2024 EXAM: 1 VIEW(S) XRAY OF THE CHEST 11/24/2024 07:14:00 PM COMPARISON: Chest x-ray 09/15/2022. CLINICAL HISTORY: SOB FINDINGS: LUNGS AND PLEURA: Slightly coarsened interstitial markings. No focal pulmonary opacity. No pleural effusion. No pneumothorax. HEART AND MEDIASTINUM: No acute abnormality of the cardiac and  mediastinal silhouettes. BONES AND SOFT TISSUES: No acute fracture or destructive lesions. Multilevel thoracic osteophytosis. IMPRESSION: 1. Slightly coarsened interstitial markings. Electronically signed by: Morgane Naveau MD 11/24/2024 07:24 PM EST RP Workstation: [id number]     Procedures   Medications Ordered in the ED  albuterol  (PROVENTIL ) (2.5 MG/3ML) 0.083% nebulizer solution (2.5 mg  Given 11/24/24 1821)  methylPREDNISolone  sodium succinate (SOLU-MEDROL ) 125 mg/2 mL injection 125 mg (125 mg Intravenous Given 11/24/24 1902)  ipratropium-albuterol  (DUONEB) 0.5-2.5 (3) MG/3ML nebulizer solution 3 mL (3 mLs Nebulization Given 11/24/24 1858)  ipratropium-albuterol  (DUONEB) 0.5-2.5 (3) MG/3ML nebulizer solution 3 mL (3 mLs Nebulization Given 11/24/24 1852)  magnesium  sulfate IVPB 2 g 50 mL (0 g Intravenous Stopped 11/24/24 1925)  Medical Decision Making 82 year old female with past medical history of diabetes, hypertension, hyperlipidemia, and asthma presenting to the emergency department today with cough and shortness of breath.  The patient is still wheezing after 1 DuoNeb here.  Will give the patient additional DuoNebs here as well as Solu-Medrol  and magnesium .  The patient continues to have increased work of breathing she will require BiPAP.  Will obtain a venous blood gas here as well.  The patient is not hypoxic after the initial nebulizer treatment.  She will require admission.  The patient's labs are largely reassuring.  X-ray shows no acute infiltrates.  On reassessment after the breathing treatments the patient is feeling a little better but is still having conversational dyspnea, wheezing, and her pulse ox is around 90 to 92%.  I think that she would benefit from admission for breathing treatments and further evaluation.  A call is placed to hospitalist service for admission.  CRITICAL CARE Performed by: Prentice JONELLE Medicus   Total critical care time: 40  minutes  Critical care time was exclusive of separately billable procedures and treating other patients.  Critical care was necessary to treat or prevent imminent or life-threatening deterioration.  Critical care was time spent personally by me on the following activities: development of treatment plan with patient and/or surrogate as well as nursing, discussions with consultants, evaluation of patient's response to treatment, examination of patient, obtaining history from patient or surrogate, ordering and performing treatments and interventions, ordering and review of laboratory studies, ordering and review of radiographic studies, pulse oximetry and re-evaluation of patient's condition.   Amount and/or Complexity of Data Reviewed Labs: ordered. Radiology: ordered.  Risk Prescription drug management.        Final diagnoses:  Asthma with acute exacerbation, unspecified asthma severity, unspecified whether persistent    ED Discharge Orders     None          [facility] Prentice JONELLE, MD 11/24/24 2001

## 2024-11-25 ENCOUNTER — Other Ambulatory Visit (HOSPITAL_COMMUNITY): Payer: Self-pay

## 2024-11-25 DIAGNOSIS — J4521 Mild intermittent asthma with (acute) exacerbation: Secondary | ICD-10-CM | POA: Diagnosis not present

## 2024-11-25 LAB — CBC
HCT: 31.4 % — ABNORMAL LOW (ref 36.0–46.0)
Hemoglobin: 9.4 g/dL — ABNORMAL LOW (ref 12.0–15.0)
MCH: 24.2 pg — ABNORMAL LOW (ref 26.0–34.0)
MCHC: 29.9 g/dL — ABNORMAL LOW (ref 30.0–36.0)
MCV: 80.7 fL (ref 80.0–100.0)
Platelets: 300 K/uL (ref 150–400)
RBC: 3.89 MIL/uL (ref 3.87–5.11)
RDW: 14.6 % (ref 11.5–15.5)
WBC: 8.7 K/uL (ref 4.0–10.5)
nRBC: 0 % (ref 0.0–0.2)

## 2024-11-25 LAB — HEMOGLOBIN A1C
Hgb A1c MFr Bld: 6.1 % — ABNORMAL HIGH (ref 4.8–5.6)
Mean Plasma Glucose: 128.37 mg/dL

## 2024-11-25 LAB — BASIC METABOLIC PANEL WITH GFR
Anion gap: 14 (ref 5–15)
BUN: 13 mg/dL (ref 8–23)
CO2: 23 mmol/L (ref 22–32)
Calcium: 9.6 mg/dL (ref 8.9–10.3)
Chloride: 98 mmol/L (ref 98–111)
Creatinine, Ser: 0.86 mg/dL (ref 0.44–1.00)
GFR, Estimated: >60 mL/min (ref >60)
Glucose, Bld: 252 mg/dL — ABNORMAL HIGH (ref 70–99)
Potassium: 3.8 mmol/L (ref 3.5–5.1)
Sodium: 135 mmol/L (ref 135–145)

## 2024-11-25 LAB — GLUCOSE, CAPILLARY
Glucose-Capillary: 209 mg/dL — ABNORMAL HIGH (ref 70–99)
Glucose-Capillary: 178 mg/dL — ABNORMAL HIGH (ref 70–99)

## 2024-11-25 MED ORDER — PANTOPRAZOLE SODIUM 40 MG PO TBEC: 40.0000 mg | DELAYED_RELEASE_TABLET | Freq: Every day | ORAL | Status: DC

## 2024-11-25 MED ORDER — DM-GUAIFENESIN ER 30-600 MG PO TB12
1.0000 | ORAL_TABLET | Freq: Two times a day (BID) | ORAL | Status: DC
  Administered 2024-11-25: 1 via ORAL
  Filled 2024-11-25: qty 1

## 2024-11-25 MED ORDER — BENZONATATE 200 MG PO CAPS
200.0000 mg | ORAL_CAPSULE | Freq: Three times a day (TID) | ORAL | 0 refills | Status: AC
  Filled 2024-11-25: qty 20, 7d supply, fill #0

## 2024-11-25 MED ORDER — IPRATROPIUM-ALBUTEROL 0.5-2.5 (3) MG/3ML IN SOLN
3.0000 mL | Freq: Four times a day (QID) | RESPIRATORY_TRACT | 0 refills | Status: AC | PRN
  Filled 2024-11-25: qty 360, 30d supply, fill #0

## 2024-11-25 MED ORDER — BENZONATATE 100 MG PO CAPS
200.0000 mg | ORAL_CAPSULE | Freq: Three times a day (TID) | ORAL | Status: DC
  Administered 2024-11-25: 200 mg via ORAL
  Filled 2024-11-25: qty 2

## 2024-11-25 MED ORDER — ALBUTEROL SULFATE HFA 108 (90 BASE) MCG/ACT IN AERS
1.0000 | INHALATION_SPRAY | Freq: Four times a day (QID) | RESPIRATORY_TRACT | 3 refills | Status: AC | PRN
  Filled 2024-11-25: qty 6.7, 25d supply, fill #0

## 2024-11-25 MED ORDER — ARFORMOTEROL TARTRATE 15 MCG/2ML IN NEBU
15.0000 ug | INHALATION_SOLUTION | Freq: Two times a day (BID) | RESPIRATORY_TRACT | 0 refills | Status: AC
  Filled 2024-11-25: qty 120, 30d supply, fill #0

## 2024-11-25 MED ORDER — AZITHROMYCIN 250 MG PO TABS
ORAL_TABLET | ORAL | 0 refills | Status: AC
  Filled 2024-11-25: qty 6, 5d supply, fill #0

## 2024-11-25 MED ORDER — ASPIRIN 81 MG PO CHEW: 81.0000 mg | CHEWABLE_TABLET | Freq: Every day | ORAL | Status: DC

## 2024-11-25 MED ORDER — DM-GUAIFENESIN ER 30-600 MG PO TB12
1.0000 | ORAL_TABLET | Freq: Two times a day (BID) | ORAL | 0 refills | Status: AC | PRN
  Filled 2024-11-25 (×2): qty 20, 10d supply, fill #0

## 2024-11-25 MED ORDER — AMLODIPINE BESYLATE 10 MG PO TABS: 10.0000 mg | ORAL_TABLET | Freq: Every day | ORAL | Status: DC

## 2024-11-25 MED ORDER — PREDNISONE 10 MG PO TABS
ORAL_TABLET | ORAL | 0 refills | Status: AC
  Filled 2024-11-25: qty 8, 2d supply, fill #0

## 2024-11-25 MED ORDER — ROSUVASTATIN CALCIUM 10 MG PO TABS: 40.0000 mg | ORAL_TABLET | Freq: Every day | ORAL | Status: DC

## 2024-11-25 MED ORDER — SIMETHICONE 80 MG PO CHEW: 80.0000 mg | CHEWABLE_TABLET | Freq: Four times a day (QID) | ORAL | Status: DC | PRN

## 2024-11-25 MED ORDER — DULOXETINE HCL 20 MG PO CPEP: 60.0000 mg | ORAL_CAPSULE | Freq: Every day | ORAL | Status: DC

## 2024-11-25 MED ORDER — BUDESONIDE 0.25 MG/2ML IN SUSP
0.2500 mg | Freq: Two times a day (BID) | RESPIRATORY_TRACT | 12 refills | Status: AC
  Filled 2024-11-25: qty 60, 15d supply, fill #0

## 2024-11-25 NOTE — Plan of Care (Signed)
 Awaiting ambulatory pulse ox. Requested at about 9 AM.

## 2024-11-25 NOTE — Discharge Summary (Signed)
 Physician Discharge Summary  Colleen Medina FMW:[id number] DOB: Dec 05, 1941 DOA: 11/24/2024  PCP: Rik Glinda DASEN, FNP  Admit date: 11/24/2024 Discharge date: 11/25/2024 Discharging to: Home Recommendations for Outpatient Follow-up:  Recommend pulmonary follow-up    Discharge Diagnoses:   Principal Problem:   Mild intermittent asthma with acute exacerbation Active Problems:   Microcytic anemia   Essential hypertension   Hospital Course:  Brief hospital course: This is an 83 year old female with mild intermittent asthma, hypertension, diabetes mellitus, microcytic anemia who presents with shortness of breath and cough which is productive of green-yellow sputum.  She was seen by her PCP on Monday who gave her a nebulizer treatment in the office.   In the ED, pulse ox noted to be 91% on room air. Chest x-ray showed coarsened interstitial markings Given Solu-Medrol , IV magnesium , albuterol  neb and DuoNebs.    Principal Problem:   Mild intermittent asthma with acute exacerbation - Patient states that she has had a cough for 2 weeks now. - COVID, influenza, RSV and respiratory viral panel negative - Her main complaint is a persistent cough - She has run out of the few samples of nebulizer treatment that her doctor gave her and did not receive a new prescription for further nebs - Hypoxia resolved and now 97% on room air with exertion - Stable to DC with: Brovana , Pulmicort , DuoNebs, prednisone  taper, azithromycin , cough suppressants and Mucinex  and albuterol  inhaler as needed - Needs to follow-up with pulmonary for further management          Discharge Instructions   Allergies as of 11/25/2024       Reactions   Sulfonamide Derivatives Anaphylaxis   Sulfa Antibiotics    Other reaction(s): Not available   Penicillins Hives, Other (See Comments)   All over the body Has patient had a PCN reaction causing immediate rash, facial/tongue/throat swelling, SOB or lightheadedness with  hypotension: No Has patient had a PCN reaction causing severe rash involving mucus membranes or skin necrosis: No Has patient had a PCN reaction that required hospitalization: Unknown Has patient had a PCN reaction occurring within the last 10 years: No If all of the above answers are NO, then may proceed with Cephalosporin use. Other reaction(s): Not available        Medication List     TAKE these medications    albuterol  108 (90 Base) MCG/ACT inhaler Commonly known as: ProAir  HFA Inhale 1-2 puffs into the lungs every 6 (six) hours as needed for shortness of breath.   amLODipine  10 MG tablet Commonly known as: NORVASC  Take 1 tablet (10 mg total) by mouth daily. What changed: Another medication with the same name was removed. Continue taking this medication, and follow the directions you see here.   arformoterol  15 MCG/2ML Nebu Commonly known as: BROVANA  Take 2 mLs (15 mcg total) by nebulization 2 (two) times daily.   Aspirin  Low Dose 81 MG chewable tablet Generic drug: aspirin  Chew 1 tablet (81 mg total) by mouth 2 (two) times daily with a meal. What changed: when to take this   benzonatate  200 MG capsule Commonly known as: TESSALON  Take 1 capsule (200 mg total) by mouth 3 (three) times daily.   budesonide  0.25 MG/2ML nebulizer solution Commonly known as: PULMICORT  Take 2 mLs (0.25 mg total) by nebulization 2 (two) times daily.   cyanocobalamin  1000 MCG tablet Commonly known as: VITAMIN B12 Take 1 tablet (1,000 mcg total) by mouth daily.   dextromethorphan -guaiFENesin  30-600 MG 12hr tablet Commonly known as: MUCINEX   DM Take 1 tablet by mouth 2 (two) times daily as needed for cough.   dicyclomine  20 MG tablet Commonly known as: BENTYL  Take 1 tablet (20 mg total) by mouth 2 (two) times daily as needed for spasms (abdominal pain).   DULoxetine  30 MG capsule Commonly known as: Cymbalta  Take 1 capsule (30 mg total) by mouth daily for 7 days, THEN 2 capsules (60 mg  total) daily. Start taking on: February 24, 2021 What changed: See the new instructions.   fluticasone  50 MCG/ACT nasal spray Commonly known as: FLONASE  Place 1 spray into both nostrils daily. What changed:  when to take this reasons to take this   hydrALAZINE  50 MG tablet Commonly known as: APRESOLINE  Take 1 tablet (50 mg total) by mouth every 8 (eight) hours.   ipratropium-albuterol  0.5-2.5 (3) MG/3ML Soln Commonly known as: DUONEB Take 3 mLs by nebulization every 6 (six) hours as needed.   lisinopril  40 MG tablet Commonly known as: ZESTRIL  Take 40 mg by mouth daily.   meloxicam  7.5 MG tablet Commonly known as: MOBIC  Take 7.5 mg by mouth daily.   omeprazole  40 MG capsule Commonly known as: PRILOSEC Take 40 mg by mouth daily.   omeprazole  40 MG capsule Commonly known as: PRILOSEC Take 40 mg by mouth daily as needed (for acid reflux).   predniSONE  10 MG tablet Commonly known as: DELTASONE  Take 4 tablets (40 mg total) by mouth daily for 2 days, THEN 2 tablets (20 mg total) daily for 2 days, THEN 1 tablet (10 mg total) daily for 2 days, THEN 0.5 tablets (5 mg total) daily for 2 days. Start taking on: November 25, 2024   psyllium 95 % Pack Commonly known as: HYDROCIL/METAMUCIL Take 1 packet by mouth 2 (two) times daily.   rosuvastatin  40 MG tablet Commonly known as: CRESTOR  Take 40 mg by mouth daily.   simethicone  80 MG chewable tablet Commonly known as: Gas-X Chew 1 tablet (80 mg total) by mouth 4 (four) times daily as needed for flatulence.   Vitamin D3 10 MCG (400 UNIT) tablet Take 2 tablets (800 Units total) by mouth daily.            The results of significant diagnostics from this hospitalization (including imaging, microbiology, ancillary and laboratory) are listed below for reference.    DG Chest Port 1 View Result Date: 11/24/2024 EXAM: 1 VIEW(S) XRAY OF THE CHEST 11/24/2024 07:14:00 PM COMPARISON: Chest x-ray 09/15/2022. CLINICAL HISTORY: SOB  FINDINGS: LUNGS AND PLEURA: Slightly coarsened interstitial markings. No focal pulmonary opacity. No pleural effusion. No pneumothorax. HEART AND MEDIASTINUM: No acute abnormality of the cardiac and mediastinal silhouettes. BONES AND SOFT TISSUES: No acute fracture or destructive lesions. Multilevel thoracic osteophytosis. IMPRESSION: 1. Slightly coarsened interstitial markings. Electronically signed by: Kate Plummer MD 11/24/2024 07:24 PM EST RP Workstation: [id number]   Labs:   Basic Metabolic Panel: Recent Labs  Lab 11/24/24 1847 11/24/24 1908 11/25/24 0544  NA 140 137 135  K 3.7 3.9 3.8  CL 101  --  98  CO2 27  --  23  GLUCOSE 110*  --  252*  BUN 13  --  13  CREATININE 0.93  --  0.86  CALCIUM  10.0  --  9.6     CBC: Recent Labs  Lab 11/24/24 1847 11/24/24 1908 11/25/24 0544  WBC 6.1  --  8.7  NEUTROABS 3.8  --   --   HGB 9.7* 10.5* 9.4*  HCT 31.9* 31.0* 31.4*  MCV 78.8*  --  80.7  PLT 310  --  300         SIGNED:   True Atlas, MD  [facility] 11/25/2024, 10:30 AM Time taking on discharge: 50 minutes

## 2024-11-25 NOTE — TOC Initial Note (Signed)
 Transition of Care [facility]) - Initial/Assessment Note    Patient Details  Name: Colleen Medina MRN: [id number] Date of Birth: 03/07/1941  [facility]) CM/SW Contact:    Doneta Glenys DASEN, RN Phone Number: 11/25/2024, 11:43 AM  Clinical Narrative:                 Transport home by family. No inpatient care management needs identified during visit.  Expected Discharge Plan: Home/Self Care Barriers to Discharge: No Barriers Identified   Patient Goals and CMS Choice Patient states their goals for this hospitalization and ongoing recovery are:: Home CMS Medicare.gov Compare Post Acute Care list provided to::  (NA) Choice offered to / list presented to : NA Severance ownership interest in [facility].provided to:: Parent NA    Expected Discharge Plan and Services In-house Referral: NA Discharge Planning Services: CM Consult   Living arrangements for the past 2 months: Single Family Home Expected Discharge Date: 11/25/24               DME Arranged: N/A DME Agency: NA       HH Arranged: NA HH Agency: NA        Prior Living Arrangements/Services Living arrangements for the past 2 months: Single Family Home Lives with:: Adult Children Patient language and need for interpreter reviewed:: Yes Do you feel safe going back to the place where you live?: Yes      Need for Family Participation in Patient Care: No (Comment) Care giver support system in place?: Yes (comment) Current home services: DME (Nebulizer) Criminal Activity/Legal Involvement Pertinent to Current Situation/Hospitalization: No - Comment as needed  Activities of Daily Living   ADL Screening (condition at time of admission) Independently performs ADLs?: No Does the patient have a NEW difficulty with bathing/dressing/toileting/self-feeding that is expected to last >3 days?: No Does the patient have a NEW difficulty with getting in/out of bed, walking, or climbing stairs that is expected to  last >3 days?: No Does the patient have a NEW difficulty with communication that is expected to last >3 days?: No Is the patient deaf or have difficulty hearing?: No Does the patient have difficulty seeing, even when wearing glasses/contacts?: No Does the patient have difficulty concentrating, remembering, or making decisions?: No  Permission Sought/Granted Permission sought to share information with : Case Manager Permission granted to share information with : Yes, Verbal Permission Granted  Share Information with NAME: Lekisha, Mcghee (Daughter)  [phone]           Emotional Assessment Appearance:: Appears stated age Attitude/Demeanor/Rapport: Engaged Affect (typically observed): Appropriate Orientation: : Oriented to Self, Oriented to Place, Oriented to  Time, Oriented to Situation Alcohol  / Substance Use: Not Applicable Psych Involvement: No (comment)  Admission diagnosis:  Acute bronchitis [J20.9] Asthma with acute exacerbation, unspecified asthma severity, unspecified whether persistent [J45.901] Patient Active Problem List   Diagnosis Date Noted   Mild intermittent asthma with acute exacerbation 11/24/2024   Colitis 04/29/2020   Fibromyalgia syndrome 09/23/2016   Essential hypertension 09/23/2016   Overflow stress urinary incontinence in female 01/01/2016   Aortic atherosclerosis 08/27/2015   Osteopenia 01/10/2014   Vitamin D  deficiency 01/10/2014   Tubulovillous adenoma of small intestine 01/10/2014   Gastroesophageal reflux disease with hiatal hernia 01/10/2014   Diverticulosis 01/10/2014   Chronic constipation 01/10/2014   Internal and external hemorrhoids without complication 01/10/2014   Seasonal allergic rhinitis 01/10/2014   Major depression in remission (HCC) 01/10/2014   Chronic venous insufficiency 01/10/2014  Osteoarthritis 11/29/2013   Chronic tension headaches 11/29/2013   Microcytic anemia 05/04/2010   Well controlled type 2 diabetes mellitus  with gastroparesis (HCC) 04/09/2009   Hyperlipidemia 04/09/2009   Mild intermittent intrinsic asthma without complication 04/09/2009   PCP:  Rik Glinda DASEN, FNP Pharmacy:   [facility] [address], [place] - [address] [place] [address] Phone: [phone] Fax: [phone]  [facility] [address] [place] [address] Phone: [phone] Fax: [phone]  [facility] - [facility] [address] [place] [address] Phone: [phone] Fax: [phone]     Social Drivers of Health (SDOH) Social History: SDOH Screenings   Food Insecurity: No Food Insecurity (11/24/2024)  Housing: Low Risk  (11/24/2024)  Transportation Needs: No Transportation Needs (11/24/2024)  Utilities: Not At Risk (11/24/2024)  Alcohol  Screen: Low Risk  (11/07/2022)  Depression (PHQ2-9): Low Risk  (11/07/2022)  Financial Resource Strain: Low Risk  (11/07/2022)  Physical Activity: Insufficiently Active (11/07/2022)  Social Connections: Moderately Integrated (11/24/2024)  Stress: No Stress Concern Present (11/07/2022)  Tobacco Use: Low Risk  (11/24/2024)   SDOH Interventions:     Readmission Risk Interventions     No data to display

## 2024-11-25 NOTE — Progress Notes (Incomplete)
  [facility] Progress Note  Patient: Colleen Medina     FMW:[id number]  DOA: 11/24/2024   PCP: Daye, Deneda T, FNP       Brief hospital course: This is an 83 year old female with mild intermittent asthma, hypertension, diabetes mellitus, microcytic anemia who presents with shortness of breath and cough which is productive of green-yellow sputum.  She was seen by her PCP on Monday who gave her a nebulizer treatment in the office.   In the ED, pulse ox noted to be 91% on room air. Chest x-ray showed coarsened interstitial markings Given Solu-Medrol , IV magnesium , albuterol  neb and DuoNebs.  Subjective:  ***  Assessment and Plan: Principal Problem:   Mild intermittent asthma with acute exacerbation Active Problems:   Microcytic anemia   Essential hypertension      Code Status: Full Code Total time on patient care: *** DVT prophylaxis:  ***   Objective:   Vitals:   11/24/24 2242 11/24/24 2244 11/25/24 0136 11/25/24 0539  BP:   133/63 (!) 145/76  Pulse:   91 74  Resp:   16 16  Temp:   97.9 F (36.6 C) 98.1 F (36.7 C)  TempSrc:   Oral   SpO2: 100% 100% 100% 97%  Weight:      Height:       Filed Weights   11/24/24 2148  Weight: 49.9 kg   Exam: ***    CBC: Recent Labs  Lab 11/24/24 1847 11/24/24 1908 11/25/24 0544  WBC 6.1  --  8.7  NEUTROABS 3.8  --   --   HGB 9.7* 10.5* 9.4*  HCT 31.9* 31.0* 31.4*  MCV 78.8*  --  80.7  PLT 310  --  300   Basic Metabolic Panel: Recent Labs  Lab 11/24/24 1847 11/24/24 1908 11/25/24 0544  NA 140 137 135  K 3.7 3.9 3.8  CL 101  --  98  CO2 27  --  23  GLUCOSE 110*  --  252*  BUN 13  --  13  CREATININE 0.93  --  0.86  CALCIUM  10.0  --  9.6     Scheduled Meds:  arformoterol   15 mcg Nebulization BID   budesonide  (PULMICORT ) nebulizer solution  0.25 mg Nebulization BID   enoxaparin  (LOVENOX ) injection  30 mg Subcutaneous Q24H   guaiFENesin   600 mg Oral BID   insulin  aspart  0-9 Units Subcutaneous TID  WC   methylPREDNISolone  (SOLU-MEDROL ) injection  40 mg Intravenous Q12H   sodium chloride  flush  3 mL Intravenous Q12H    Imaging and lab data personally reviewed   Author: Laporscha Linehan  11/25/2024 7:25 AM  To contact [facility]>   Check the care team in [facility] and look for the attending/consulting [facility] provider listed  Log into [email or web address] and use [facility] universal password   Go to> [facility]  and find provider  If you still have difficulty reaching the provider, please page the [facility] (Director on Call) for the Hospitalists listed on amion

## 2024-11-25 NOTE — Discharge Instructions (Signed)
 Please wear a mask is much as possible when you are around people during this winter.  You were cared for by a hospitalist during your hospital stay. Please review all of you discharge paperwork on the day of discharge and be sure you have all of your prescribed medications and please read the below instructions:  Once you are discharged, your primary care physician will handle any further medical issues. Please note that NO REFILLS for any discharge medications will be authorized once you are discharged as it is imperative that you return to your primary care physician (or establish a relationship with a primary care physician if you do not have one) for your aftercare needs. Please obtain a follow up appointment with your primary care physician within 1-2 weeks of discharge. Please take all your medications with you for your next visit with your Primary MD. Please request your Primary MD to go over all Hospital Tests and Procedures, Radiological results at the follow up appointment. In some cases, there will be blood work, cultures and biopsy results pending at the time of your discharge. Please request that your primary care M.D. goes through all the records of your hospital data and follows up on these results. Please get all hospital records sent to your primary MD by signing hospital release before you go home or request your primary care doctor's office to assist with obtaining medical records.   You must read complete instructions/literature along with all the possible adverse reactions/side effects for all the medicines that have been prescribed to you. Take any new medicines after you have completely understood and accpet all the possible adverse reactions/side effects.  Please take medications as prescribed and speak with your doctor if changes are needed.   If you have smoked or chewed tobacco in the last 2 yrs please stop. Stop any regular alcohol   and or any recreational drug use. Wear  Seat belts while driving.   If you had Pneumonia at the Hospital: Please get a 2 view Chest X ray done in 6-8 weeks after hospital discharge or sooner if instructed by your Primary MD.   If you have Congestive Heart Failure: Follow a cardiac low salt diet and 1.5 lit/day fluid restriction. Please call your Cardiologist or Primary MD anytime you have any of the following symptoms:  1) 3 pound weight gain in 24 hours or 5 pounds in 1 week  2) shortness of breath, with or without a dry hacking cough  3) increasing swelling in the feet or stomach  4) if you have to sleep on extra pillows at night in order to breathe   If you have Diabetes: Check blood sugars 4 times/day- once on AM empty stomach and then before each meal. Log in all results and show them to your primary doctor at your next visit. If glucose readings are often under 60 or above 400 call your primary MD to see if medication dosages need to be adjusted   If you have Syncope (passing out) or Seizure/Convulsions/Epilepsy: Please do not drive, operate heavy machinery, participate in activities at heights or participate in high speed sports until you have seen by Primary MD or a Neurologist and advised to do so again. Per [place]  DMV statutes, patients with seizures are not allowed to drive until they have been seizure-free for six months.  Use caution when using heavy equipment or power tools. Avoid working on ladders or at heights. Take showers instead of baths. Ensure the water temperature is  not too high on the home water heater. Do not go swimming alone. Do not lock yourself in a room alone (i.e. bathroom). When caring for infants or small children, sit down when holding, feeding, or changing them to minimize risk of injury to the child in the event you have a seizure. Maintain good sleep hygiene. Avoid alcohol .    If you had Gastrointestinal Bleeding: Please ask your Primary MD to check a complete blood count within one  week of discharge or at your next visit. Your endoscopic/colonoscopic biopsies that are pending at the time of discharge will also need to followed by your Primary MD.    [facility] can reach the hospitalist office at phone [phone] or fax [phone]   If you do not have a primary care physician, you can call [phone] for a physician referral.

## 2024-11-25 NOTE — Progress Notes (Signed)
SATURATION QUALIFICATIONS: (This note is used to comply with regulatory documentation for home oxygen)  Patient Saturations on Room Air at Rest = 99%  Patient Saturations on Room Air while Ambulating = 97   

## 2024-11-25 NOTE — Progress Notes (Signed)
 Discharge medications delivered to patient at the bedside.
